# Patient Record
Sex: Female | Born: 1970 | Race: Black or African American | Hispanic: No | Marital: Married | Smoking: Never smoker
Health system: Southern US, Community
[De-identification: ages and names within clinical notes are randomized; demographics above are authoritative.]

## PROBLEM LIST (undated history)

## (undated) DIAGNOSIS — G43909 Migraine, unspecified, not intractable, without status migrainosus: Secondary | ICD-10-CM

## (undated) DIAGNOSIS — F0781 Postconcussional syndrome: Secondary | ICD-10-CM

## (undated) HISTORY — DX: Postconcussional syndrome: F07.81

## (undated) HISTORY — PX: TUBAL LIGATION: SHX77

## (undated) HISTORY — PX: TONSILLECTOMY: SUR1361

---

## 2001-05-09 ENCOUNTER — Inpatient Hospital Stay (HOSPITAL_COMMUNITY): Admission: AD | Admit: 2001-05-09 | Discharge: 2001-05-09 | Payer: Self-pay | Admitting: *Deleted

## 2001-05-20 ENCOUNTER — Inpatient Hospital Stay (HOSPITAL_COMMUNITY): Admission: AD | Admit: 2001-05-20 | Discharge: 2001-05-23 | Payer: Self-pay | Admitting: *Deleted

## 2001-12-23 ENCOUNTER — Ambulatory Visit (HOSPITAL_COMMUNITY): Admission: RE | Admit: 2001-12-23 | Discharge: 2001-12-23 | Payer: Self-pay | Admitting: Family Medicine

## 2003-03-09 ENCOUNTER — Other Ambulatory Visit: Admission: RE | Admit: 2003-03-09 | Discharge: 2003-03-09 | Payer: Self-pay | Admitting: *Deleted

## 2003-03-19 ENCOUNTER — Inpatient Hospital Stay (HOSPITAL_COMMUNITY): Admission: AD | Admit: 2003-03-19 | Discharge: 2003-03-19 | Payer: Self-pay | Admitting: *Deleted

## 2003-06-07 ENCOUNTER — Ambulatory Visit (HOSPITAL_COMMUNITY): Admission: RE | Admit: 2003-06-07 | Discharge: 2003-06-07 | Payer: Self-pay | Admitting: Obstetrics and Gynecology

## 2003-10-24 ENCOUNTER — Inpatient Hospital Stay (HOSPITAL_COMMUNITY): Admission: AD | Admit: 2003-10-24 | Discharge: 2003-10-26 | Payer: Self-pay | Admitting: Obstetrics and Gynecology

## 2003-12-02 ENCOUNTER — Other Ambulatory Visit: Admission: RE | Admit: 2003-12-02 | Discharge: 2003-12-02 | Payer: Self-pay | Admitting: Obstetrics and Gynecology

## 2005-02-28 ENCOUNTER — Other Ambulatory Visit: Admission: RE | Admit: 2005-02-28 | Discharge: 2005-02-28 | Payer: Self-pay | Admitting: Obstetrics and Gynecology

## 2006-05-31 ENCOUNTER — Other Ambulatory Visit: Admission: RE | Admit: 2006-05-31 | Discharge: 2006-05-31 | Payer: Self-pay | Admitting: Obstetrics and Gynecology

## 2007-07-28 ENCOUNTER — Ambulatory Visit (HOSPITAL_COMMUNITY): Admission: RE | Admit: 2007-07-28 | Discharge: 2007-07-28 | Payer: Self-pay | Admitting: Obstetrics and Gynecology

## 2010-03-02 ENCOUNTER — Encounter: Admission: RE | Admit: 2010-03-02 | Discharge: 2010-03-02 | Payer: Self-pay | Admitting: Internal Medicine

## 2010-12-05 NOTE — Op Note (Signed)
NAMEMICHELLE, [name]               ACCOUNT NO.:  [id number]   MEDICAL RECORD NO.:  [id number]          PATIENT TYPE:  AMB   LOCATION:  SDC                           [facility]ILITY:  [facility]   PHYSICIAN:  Dois Davenport A. Rivard, M.D. DATE OF BIRTH:  [birth year]   DATE OF PROCEDURE:  07/28/2007  DATE OF DISCHARGE:                               OPERATIVE REPORT   PREOPERATIVE DIAGNOSIS:  Desire for sterilization.   POSTOPERATIVE DIAGNOSIS:  Desire for sterilization.   ANESTHESIA:  General.   PROCEDURE:  Bilateral tubal ligation with laparoscopy.   SURGEON:  Crist Fat. Rivard, M.D.  no assistant.   ESTIMATED BLOOD LOSS:  Minimal.   PROCEDURE:  After being informed of the planned procedure with possible  complications including bleeding, infection, injury to bowels, bladder  or ureters, need for laparotomy, irreversibility and failure rate of one  in 1000, informed consent is obtained.  The patient is taken to OR #3,  given general anesthesia with endotracheal intubation without any  complication.  She is placed in a lithotomy position, prepped and draped  in a sterile fashion, and her bladder is emptied with an in-and-out  Foley catheter.  Pelvic exam reveals an anteverted uterus, normal in  size and shape, and two normal adnexa.  A speculum is inserted.  Anterior lip of the cervix is grasped with a tenaculum forceps and we  place an acorn intrauterine manipulator without difficulty.  The  speculum is then removed.   We then infiltrate the umbilical area with 7 mL of Marcaine 0.25% and  perform a semielliptical incision, which is brought down bluntly to the  fascia.  Fascia is identified and grasped with Kocher forceps and  incised with Mayo scissors.  Peritoneum is entered bluntly.  A  pursestring suture of 0 Vicryl is placed on the fascia and a 10-mm  Hasson trocar is easily inserted, held in place with a pursestring  suture.   We can now insufflate pneumoperitoneum with CO2 at a maximum  pressure of  15 mmHg and insert and operative laparoscope.   Observation:  Anterior cul-de-sac and posterior cul-de-sac are normal.  Uterus is normal.  Both tubes and both ovaries are normal.  The appendix  is visualized and normal.  Liver and gallbladder are normal.   Using bipolar cauterization, each tube is cauterized in its isthmic-  ampullary area on a distance of 1.5 cm including mesosalpinx.  Instruments are then removed, pneumoperitoneum is evacuated, and the  previously-placed pursestring suture is tied to close the fascia.  Skin  is closed with a subcuticular suture of 3-0 Monocryl and Dermabond.   Instrument and sponge counts are complete x2.  Estimated blood loss is  minimal.  The procedure is very well-tolerated by the patient, who is  taken to recovery room in a well and stable condition.      Crist Fat Rivard, M.D.  Electronically Signed     SAR/MEDQ  D:  07/28/2007  T:  07/28/2007  Job:  [id number]

## 2010-12-08 NOTE — H&P (Signed)
NAME:  Jennifer Manning, Jennifer Manning                         ACCOUNT NO.:  [id number]   MEDICAL RECORD NO.:  [id number]                   PATIENT TYPE:  INP   LOCATION:  9107                                 [facility]ILITY:  [facility]   PHYSICIAN:  Jennifer Manning, M.D.             DATE OF BIRTH:  11-Dec-1970   DATE OF ADMISSION:  10/24/2003  DATE OF DISCHARGE:                                HISTORY & PHYSICAL   Jennifer Manning is a 40 year old married African American female.  She is  gravida 4, para 2-0-1-2 at [redacted] weeks gestation.  EDD October 31, 2003, by LMP  dates and confirmed with 18-week ultrasound.  She presents in labor with  contractions every two to three minutes.  Her labor began last evening.  Her  contractions picked up through the night and increasing all day today.  She  reports positive fetal movement.  No bleeding, no rupture of membranes.  Denies any PIH symptoms, no headaches, visual changes or epigastric change.  Her pregnancy has been followed by the M.D. service at [facility] and is  remarkable for:  1. Advanced maternal age.  2. Transfer from Dr. Elliot Gault at 18 weeks.  3. Group B Strep negative.  4. Desires BTL.   This patient began prenatal care at [facility] on June 03, 2003, at 18 weeks  and 5 days by dates.  Her pregnancy has been complicated by preterm  contractures with no cervical change.  She has had frequent fetal  fibronectins approximately every two weeks since 23 weeks, which have all  been negative.  She has been size equal to dates throughout, normotensive  with no proteinuria.   OBSTETRICAL HISTORY:  In 1997, the patient had a normal spontaneous vaginal  delivery at 40 weeks with the birth of a 7 pound 1 ounce female infant named  Jennifer Manning, with no complications.  In the year 2000, the patient had a first  trimester SAB with D&C.  In 2002, the patient had a normal spontaneous  vaginal delivery at 40 weeks, birth of a 7 pound female infant named Jennifer Manning,  at term with no  complications.   ALLERGIES:  Patient is allergic to SULFA which causes swelling in her  throat.   PAST MEDICAL HISTORY:  1. History of anemia with pregnancy.  2. Abnormal Pap smear in 1989 with colposcopy, within normal limits since     that time.   PAST SURGICAL HISTORY:  Tonsils 1987.   FAMILY HISTORY:  Maternal grandmother with MI.  Father, mother and maternal  grandmother with hypertension.  The patient states diabetes runs in both  sides of the family.  Her mother has a history of epilepsy.  Paternal  grandmother with stomach cancer.  Maternal grandmother with throat cancer.   GENETIC HISTORY:  Father of the baby and his sister have scoliosis and  father of the baby's first cousin has sickle cell disease.   SOCIAL [name] is a 40 year old married African American  female.  Her husband, Jennifer Manning, is involved and supportive.  They  follow the [employer] faith.  She denies the use of tobacco,  alcohol or illicit drugs.   Her blood type is B positive.  Quad screen was normal.  Glucola at 28 weeks  within normal limits.  Her hemoglobin at that time was 8.3.  She is  currently taking iron.  At 36 weeks, culture of the vaginal tract is  negative for group B Strep, GC and chlamydia.  Her other initial prenatal  lab work will be looked up in the computer.   REVIEW OF SYMPTOMS:  As described above.  The patient is at term in early  active labor with positive fetal movement, no bleeding and no rupture of  membranes.   PHYSICAL EXAMINATION:  VITAL SIGNS:  Stable.  Her initial blood pressure was  137/91.  She is afebrile.  Blood pressure will be repeated.  HEENT:  Unremarkable.  CARDIOVASCULAR:  Regular rate and rhythm.  ABDOMEN:  Gravid in its contour.  Uterine fundus is noted to extend 39 cm  above the level of the pubic symphysis.  Leopold's maneuver finds the infant  to be in longitudinal lie, cephalic presentation and the estimated fetal  weight is  7-1/2 pounds.  The baseline of the fetal heart rate monitor is 130  to 140 with average long term variability.  Reactivity is present with no  deceleration noted.  The patient is contracting every two to three minutes.  CERVIX:  Digital examination of the cervix finds it to be 5 cm dilated, 90%  effaced with the cephalic presenting part at a -1 station and membranes  intact.  EXTREMITIES:  No pathologic edema.  DTRs are 1+ with no clonus.   ASSESSMENT:  1. Intrauterine pregnancy at 39 weeks.  2. Active labor.  3. Desires sterilization.   PLAN:  Admit per Jennifer Manning, M.D.  May have epidural.     Jennifer Manning, C.N.M.               Jennifer Manning, M.D.    SDM/MEDQ  D:  10/24/2003  T:  10/25/2003  Job:  [id number]

## 2011-04-11 LAB — CBC
HCT: 33.3 — ABNORMAL LOW
Hemoglobin: 11 — ABNORMAL LOW
MCHC: 33.1
MCV: 85.7
Platelets: 358
RBC: 3.89
RDW: 14.9
WBC: 4.5

## 2011-04-11 LAB — PREGNANCY, URINE: Preg Test, Ur: NEGATIVE

## 2011-10-12 ENCOUNTER — Ambulatory Visit (INDEPENDENT_AMBULATORY_CARE_PROVIDER_SITE_OTHER): Payer: BC Managed Care – PPO | Admitting: Obstetrics and Gynecology

## 2011-10-12 DIAGNOSIS — Z01419 Encounter for gynecological examination (general) (routine) without abnormal findings: Secondary | ICD-10-CM

## 2011-10-12 DIAGNOSIS — Z124 Encounter for screening for malignant neoplasm of cervix: Secondary | ICD-10-CM

## 2012-04-15 ENCOUNTER — Other Ambulatory Visit: Payer: Self-pay | Admitting: Internal Medicine

## 2012-04-15 DIAGNOSIS — Z1231 Encounter for screening mammogram for malignant neoplasm of breast: Secondary | ICD-10-CM

## 2012-05-02 ENCOUNTER — Ambulatory Visit
Admission: RE | Admit: 2012-05-02 | Discharge: 2012-05-02 | Disposition: A | Discharge status: Home / Self Care | Payer: Self-pay | Source: Ambulatory Visit | Attending: Internal Medicine | Admitting: Internal Medicine

## 2012-05-02 DIAGNOSIS — Z1231 Encounter for screening mammogram for malignant neoplasm of breast: Secondary | ICD-10-CM

## 2013-10-28 ENCOUNTER — Other Ambulatory Visit: Payer: Self-pay

## 2013-10-28 DIAGNOSIS — Z1231 Encounter for screening mammogram for malignant neoplasm of breast: Secondary | ICD-10-CM

## 2013-11-06 ENCOUNTER — Ambulatory Visit
Admission: RE | Admit: 2013-11-06 | Discharge: 2013-11-06 | Disposition: A | Discharge status: Home / Self Care | Payer: BC Managed Care – PPO | Source: Ambulatory Visit

## 2013-11-06 DIAGNOSIS — Z1231 Encounter for screening mammogram for malignant neoplasm of breast: Secondary | ICD-10-CM

## 2013-11-11 ENCOUNTER — Ambulatory Visit: Payer: Self-pay

## 2013-11-11 ENCOUNTER — Other Ambulatory Visit: Payer: Self-pay | Admitting: Internal Medicine

## 2013-11-11 DIAGNOSIS — R928 Other abnormal and inconclusive findings on diagnostic imaging of breast: Secondary | ICD-10-CM

## 2013-11-20 ENCOUNTER — Encounter (INDEPENDENT_AMBULATORY_CARE_PROVIDER_SITE_OTHER): Payer: Self-pay

## 2013-11-20 ENCOUNTER — Ambulatory Visit
Admission: RE | Admit: 2013-11-20 | Discharge: 2013-11-20 | Disposition: A | Discharge status: Home / Self Care | Payer: BC Managed Care – PPO | Source: Ambulatory Visit | Attending: Internal Medicine | Admitting: Internal Medicine

## 2013-11-20 DIAGNOSIS — R928 Other abnormal and inconclusive findings on diagnostic imaging of breast: Secondary | ICD-10-CM

## 2013-12-20 ENCOUNTER — Ambulatory Visit (INDEPENDENT_AMBULATORY_CARE_PROVIDER_SITE_OTHER): Payer: BC Managed Care – PPO | Admitting: Physician Assistant

## 2013-12-20 VITALS — BP 102/66 | HR 74 | Temp 98.5°F | Ht 60.0 in | Wt 128.4 lb

## 2013-12-20 DIAGNOSIS — J029 Acute pharyngitis, unspecified: Secondary | ICD-10-CM

## 2013-12-20 DIAGNOSIS — E559 Vitamin D deficiency, unspecified: Secondary | ICD-10-CM | POA: Insufficient documentation

## 2013-12-20 LAB — POCT RAPID STREP A (OFFICE): Rapid Strep A Screen: NEGATIVE

## 2013-12-20 MED ORDER — MAGIC MOUTHWASH W/LIDOCAINE: 10.0000 mL | ORAL | Status: DC | PRN

## 2013-12-20 MED ORDER — IPRATROPIUM BROMIDE 0.03 % NA SOLN: 2.0000 | Freq: Two times a day (BID) | NASAL | Status: DC

## 2013-12-20 NOTE — Progress Notes (Signed)
Subjective:    Patient ID: Jennifer Manning, female    DOB: 1970-11-25, 43 y.o.   MRN: [id number]   PCP: Maximino Greenland, MD  Chief Complaint  Patient presents with  . sore throat    x 3 days      Active Ambulatory Problems    Diagnosis Date Noted  . Vitamin D deficiency 12/20/2013   Resolved Ambulatory Problems    Diagnosis Date Noted  . No Resolved Ambulatory Problems   No Additional Past Medical History    Past Surgical History  Procedure Laterality Date  . Tubal ligation    . Tonsillectomy      Allergies  Allergen Reactions  . Sulfa Antibiotics Anaphylaxis    Prior to Admission medications   Medication Sig Start Date End Date Taking? Authorizing Provider  ergocalciferol (VITAMIN D2) 50000 UNITS capsule Take 50,000 Units by mouth 2 (two) times a week.   Yes Historical Provider, MD  magnesium 30 MG tablet Take 30 mg by mouth 2 (two) times daily.   Yes Historical Provider, MD    History   Social History  . Marital Status: Married    Spouse Name: [name]    Number of Children: 3  . Years of Education: N/A   Occupational History  . speech pathologist [place] History Main Topics  . Smoking status: Never Smoker   . Smokeless tobacco: Never Used  . Alcohol Use: No  . Drug Use: None  . Sexual Activity: None   Other Topics Concern  . None   Social History Narrative   Lives with her husband and 3 children.  Her husband has been working in [facility], where they will move soon.    family history is not on file. indicated that her mother is deceased. She indicated that her father is deceased. She indicated that her brother is alive.   HPI  Sore throat x 3 days.  Initially felt like it was dry, so she increased her oral fluid intake.  Awoke about 2 am today with significantly worse pain. Pain in the LEFT neck and ear.  Left side of her throat looks swollen.  Chills, no fever.some coughing, but mild. No nasal congestion nor drainage.  Now  has a mild HA.  No known sick contacts, though she works in the [employer]. A colleague had strep throat seeral weeks ago.  Review of Systems As above.  No changes in bowel/bladder habits. No unexplained myalgias, arthralgias. No rash.    Objective:   Physical Exam  Constitutional: She is oriented to person, place, and time. She appears well-developed and well-nourished. She is active and cooperative. No distress.  BP 102/66  Pulse 74  Temp(Src) 98.5 F (36.9 C) (Oral)  Ht 5' (1.524 m)  Wt 128 lb 6.4 oz (58.242 kg)  BMI 25.08 kg/m2  SpO2 98%   HENT:  Head: Normocephalic and atraumatic.  Right Ear: Hearing, tympanic membrane, external ear and ear canal normal.  Left Ear: Hearing, tympanic membrane, external ear and ear canal normal.  Nose: Nose normal.  Mouth/Throat: Oropharynx is clear and moist and mucous membranes are normal. No oropharyngeal exudate.  Tonsils are surgically absent  Eyes: Conjunctivae and EOM are normal. Pupils are equal, round, and reactive to light. Right eye exhibits no discharge. Left eye exhibits no discharge. No scleral icterus.  Neck: Normal range of motion. Neck supple. No thyromegaly present.  Cardiovascular: Normal rate, regular rhythm and normal heart sounds.  Pulmonary/Chest: Effort normal and breath sounds normal.  Lymphadenopathy:    She has no cervical adenopathy (but tonsillar node area is tender, without palpable LAD).  Neurological: She is alert and oriented to person, place, and time.  Skin: Skin is warm and dry.  Psychiatric: She has a normal mood and affect. Her behavior is normal.   Results for orders placed in visit on 12/20/13  POCT RAPID STREP A (OFFICE)      Result Value Ref Range   Rapid Strep A Screen Negative  Negative          Assessment & Plan:  1. Acute pharyngitis Likely viral.  Await culture. Supportive care. Anticipatory guidance. - POCT rapid strep A - Culture, Group A Strep - Alum & Mag  Hydroxide-Simeth (MAGIC MOUTHWASH W/LIDOCAINE) SOLN; Take 10 mLs by mouth every 2 (two) hours as needed for mouth pain.  Dispense: 360 mL; Refill: 0 - ipratropium (ATROVENT) 0.03 % nasal spray; Place 2 sprays into both nostrils 2 (two) times daily.  Dispense: 30 mL; Refill: 0  Return if symptoms worsen or fail to improve.   Fara Chute, PA-C Physician Assistant-Certified [facility]

## 2013-12-20 NOTE — Patient Instructions (Signed)
Get plenty of rest and drink at least 64 ounces of water daily. Ibuprofen and/or acetaminophen as needed for pain (throat, head).

## 2013-12-22 LAB — CULTURE, GROUP A STREP: Organism ID, Bacteria: NORMAL

## 2014-09-06 ENCOUNTER — Ambulatory Visit (INDEPENDENT_AMBULATORY_CARE_PROVIDER_SITE_OTHER): Payer: BC Managed Care – PPO | Admitting: Emergency Medicine

## 2014-09-06 VITALS — BP 112/78 | HR 77 | Temp 98.2°F | Resp 16 | Ht 60.0 in | Wt 126.2 lb

## 2014-09-06 DIAGNOSIS — G5622 Lesion of ulnar nerve, left upper limb: Secondary | ICD-10-CM

## 2014-09-06 MED ORDER — NAPROXEN SODIUM 550 MG PO TABS: 550.0000 mg | ORAL_TABLET | Freq: Two times a day (BID) | ORAL | Status: AC

## 2014-09-06 NOTE — Progress Notes (Signed)
[facility] [address], [place] [address] [phone]  Date:  09/06/2014   Name:  Jennifer Manning   DOB:  [birth year]   MRN:  [id number]  PCP:  Maximino Greenland, MD    Chief Complaint: Elbow Pain   History of Present Illness:  Jennifer Manning is a 44 y.o. very pleasant female patient who presents with the following:  2 week history of ulnar radiation numbness and elbow pain No history of injury or overuse. No weakness. No improvement with over the counter medications or other home remedies.  Denies other complaint or health concern today.   Patient Active Problem List   Diagnosis Date Noted  . Vitamin D deficiency 12/20/2013    No past medical history on file.  Past Surgical History  Procedure Laterality Date  . Tubal ligation    . Tonsillectomy      History  Substance Use Topics  . Smoking status: Never Smoker   . Smokeless tobacco: Never Used  . Alcohol Use: No    No family history on file.  Allergies  Allergen Reactions  . Sulfa Antibiotics Anaphylaxis    Medication list has been reviewed and updated.  Current Outpatient Prescriptions on File Prior to Visit  Medication Sig Dispense Refill  . ergocalciferol (VITAMIN D2) 50000 UNITS capsule Take 50,000 Units by mouth 2 (two) times a week.    . magnesium 30 MG tablet Take 30 mg by mouth 2 (two) times daily.     No current facility-administered medications on file prior to visit.    Review of Systems:  As per HPI, otherwise negative.    Physical Examination: Filed Vitals:   09/06/14 1458  BP: 112/78  Pulse: 77  Temp: 98.2 F (36.8 C)  Resp: 16   Filed Vitals:   09/06/14 1458  Height: 5' (1.524 m)  Weight: 126 lb 3.2 oz (57.244 kg)   Body mass index is 24.65 kg/(m^2). Ideal Body Weight: Weight in (lb) to have BMI = 25: 127.7   GEN: WDWN, NAD, Non-toxic, Alert & Oriented x 3 HEENT: Atraumatic, Normocephalic.  Ears and Nose: No external deformity. EXTR: No  clubbing/cyanosis/edema NEURO: Normal gait.  PSYCH: Normally interactive. Conversant. Not depressed or anxious appearing.  Calm demeanor.  LEFT elbow tender over lateral epicondyle no ecchymosis or swelling.  Normal ROM.  Neuro intact  Assessment and Plan: Ulnar neuritis Anaprox Local heat Follow up in one week  Signed,  Ellison Carwin, MD

## 2014-09-06 NOTE — Patient Instructions (Signed)
Tennis Tennis Elbow Your caregiver has diagnosed you with a condition often referred to as "tennis elbow." This results from small tears or soreness (inflammation) at the start (origin) of the extensor muscles of the forearm. Although the condition is often called tennis or golfer's elbow, it is caused by any repetitive action performed by your elbow. HOME CARE INSTRUCTIONS  If the condition has been short lived, rest may be the only treatment required. Using your opposite hand or arm to perform the task may help. Even changing your grip may help rest the extremity. These may even prevent the condition from recurring.  Longer standing problems, however, will often be relieved faster by:  Using anti-inflammatory agents.  Applying ice packs for 30 minutes at the end of the working day, at bed time, or when activities are finished.  Your caregiver may also have you wear a splint or sling. This will allow the inflamed tendon to heal. At times, steroid injections aided with a local anesthetic will be required along with splinting for 1 to 2 weeks. Two to three steroid injections will often solve the problem. In some long standing cases, the inflamed tendon does not respond to conservative (non-surgical) therapy. Then surgery may be required to repair it. MAKE SURE YOU:   Understand these instructions.  Will watch your condition.  Will get help right away if you are not doing well or get worse. Document Released: 07/09/2005 Document Revised: 10/01/2011 Document Reviewed: 02/25/2008 [facility] Patient Information 2015 [place], [place]. This information is not intended to replace advice given to you by your health care provider. Make sure you discuss any questions you have with your health care provider.

## 2014-12-06 ENCOUNTER — Encounter (HOSPITAL_COMMUNITY): Payer: Self-pay | Admitting: Emergency Medicine

## 2014-12-06 ENCOUNTER — Emergency Department (HOSPITAL_COMMUNITY)
Admission: EM | Admit: 2014-12-06 | Discharge: 2014-12-06 | Disposition: A | Discharge status: Home / Self Care | Payer: BC Managed Care – PPO | Attending: Emergency Medicine | Admitting: Emergency Medicine

## 2014-12-06 ENCOUNTER — Ambulatory Visit (INDEPENDENT_AMBULATORY_CARE_PROVIDER_SITE_OTHER): Payer: BC Managed Care – PPO | Admitting: Emergency Medicine

## 2014-12-06 VITALS — BP 108/70 | HR 84 | Temp 98.1°F | Resp 16 | Ht 61.0 in | Wt 124.0 lb

## 2014-12-06 DIAGNOSIS — R110 Nausea: Secondary | ICD-10-CM

## 2014-12-06 DIAGNOSIS — J029 Acute pharyngitis, unspecified: Secondary | ICD-10-CM

## 2014-12-06 DIAGNOSIS — G43909 Migraine, unspecified, not intractable, without status migrainosus: Secondary | ICD-10-CM | POA: Diagnosis not present

## 2014-12-06 DIAGNOSIS — R51 Headache: Secondary | ICD-10-CM

## 2014-12-06 DIAGNOSIS — Z79899 Other long term (current) drug therapy: Secondary | ICD-10-CM | POA: Insufficient documentation

## 2014-12-06 DIAGNOSIS — G43109 Migraine with aura, not intractable, without status migrainosus: Secondary | ICD-10-CM | POA: Diagnosis not present

## 2014-12-06 DIAGNOSIS — R519 Headache, unspecified: Secondary | ICD-10-CM

## 2014-12-06 HISTORY — DX: Migraine, unspecified, not intractable, without status migrainosus: G43.909

## 2014-12-06 LAB — POCT RAPID STREP A (OFFICE): Rapid Strep A Screen: NEGATIVE

## 2014-12-06 MED ORDER — METOCLOPRAMIDE HCL 5 MG/ML IJ SOLN
10.0000 mg | Freq: Once | INTRAMUSCULAR | Status: AC
  Administered 2014-12-06: 10 mg via INTRAVENOUS
  Filled 2014-12-06: qty 2

## 2014-12-06 MED ORDER — DIPHENHYDRAMINE HCL 50 MG/ML IJ SOLN
25.0000 mg | Freq: Once | INTRAMUSCULAR | Status: DC
Start: 2014-12-06 — End: 2014-12-07

## 2014-12-06 MED ORDER — ONDANSETRON 4 MG PO TBDP
8.0000 mg | ORAL_TABLET | Freq: Once | ORAL | Status: AC
  Administered 2014-12-06: 8 mg via ORAL

## 2014-12-06 MED ORDER — SODIUM CHLORIDE 0.9 % IV BOLUS (SEPSIS)
1000.0000 mL | Freq: Once | INTRAVENOUS | Status: AC
  Administered 2014-12-06: 1000 mL via INTRAVENOUS

## 2014-12-06 MED ORDER — KETOROLAC TROMETHAMINE 60 MG/2ML IM SOLN
60.0000 mg | Freq: Once | INTRAMUSCULAR | Status: AC
  Administered 2014-12-06: 60 mg via INTRAMUSCULAR

## 2014-12-06 MED ORDER — DIPHENHYDRAMINE HCL 50 MG/ML IJ SOLN
25.0000 mg | Freq: Once | INTRAMUSCULAR | Status: AC
  Administered 2014-12-06: 25 mg via INTRAVENOUS
  Filled 2014-12-06: qty 1

## 2014-12-06 MED ORDER — KETOROLAC TROMETHAMINE 30 MG/ML IJ SOLN
30.0000 mg | Freq: Once | INTRAMUSCULAR | Status: AC
  Administered 2014-12-06: 30 mg via INTRAVENOUS
  Filled 2014-12-06: qty 1

## 2014-12-06 NOTE — Progress Notes (Signed)
   Subjective:  This chart was scribed for Nena Jordan, MD by [facility], medical scribe at [facility].The patient was seen in exam room 13 and the patient's care was started at 12:16 PM.   Patient ID: Jennifer Manning, female    DOB: [birth year], 44 y.o.   MRN: [id number] Chief Complaint  Patient presents with  . Migraine  . Cough    Somewhat Productive  . Sore Throat    Migraine  Associated symptoms include coughing, nausea, photophobia and a sore throat. Pertinent negatives include no vomiting.  Cough Associated symptoms include headaches and a sore throat.  Sore Throat  Associated symptoms include coughing and headaches. Pertinent negatives include no vomiting.    HPI Comments: Jennifer Manning is a 44 y.o. female who [facility] to [facility] complaining of a recurrent migraine flare up. Her migraine began on Thursday and worsened yesterday. She says the migraine is similar to past migraines but more painful. She rates the pain as 7/10. The pain radiates down her neck. Pt is taking excedrin for little relief. She has nausea, photophobia and chills as associated symptoms. Light and sound worsen her headache. She also complains of a cough and sore throat. The cough worsens her migraine. Her PCP is Dr. Baird Cancer. She denies vomiting.   Review of Systems  HENT: Positive for sore throat.   Eyes: Positive for photophobia.  Respiratory: Positive for cough.   Gastrointestinal: Positive for nausea. Negative for vomiting.  Neurological: Positive for headaches.      Objective:  BP 108/70 mmHg  Pulse 84  Temp(Src) 98.1 F (36.7 C) (Oral)  Resp 16  Ht 5\' 1"  (1.549 m)  Wt 124 lb (56.246 kg)  BMI 23.44 kg/m2  SpO2 98% Physical Exam  Nursing note and vitals reviewed. CONSTITUTIONAL: Well developed/well nourished HEAD: Normocephalic/atraumatic EYES: EOMI/PERRL ENMT: Mucous membranes moist NECK: supple no meningeal signs SPINE/BACK:entire spine  nontender CV: S1/S2 noted, no murmurs/rubs/gallops noted LUNGS: Lungs are clear to auscultation bilaterally, no apparent distress ABDOMEN: soft, nontender, no rebound or guarding, bowel sounds noted throughout abdomen GU:no cva tenderness NEURO: Pt is awake/alert/appropriate, moves all extremitiesx4.  No facial droop.   EXTREMITIES: pulses normal/equal, full ROM SKIN: warm, color normal PSYCH: no abnormalities of mood noted, alert and oriented to situation.    Results for orders placed or performed in visit on 12/06/14  POCT rapid strep A  Result Value Ref Range   Rapid Strep A Screen Negative Negative   Assessment & Plan:  Was in long emergency room was called and she was transported by private car for their evaluation. She received partial relief with Toradol injection but still had a 4-5 out of 10 headache. I also suggested she consider prophylactic treatment with Topamax or Depakote in the future.I personally performed the services described in this documentation, which was scribed in my presence. The recorded information has been reviewed and is accurate.  Nena Jordan, MD

## 2014-12-06 NOTE — Discharge Instructions (Signed)
Refer to attached documents for more information.

## 2014-12-06 NOTE — ED Notes (Signed)
Patient is alert and oriented x3.  She was given DC instructions and follow up visit instructions.  Patient gave verbal understanding. She was DC ambulatory under her own power to home.  V/S stable.  He was not showing any signs of distress on DC 

## 2014-12-06 NOTE — Progress Notes (Signed)
° °  Subjective:  This chart was scribed for Nena Jordan, MD by [facility], medical scribe at [facility].The patient was seen in exam room 13 and the patient's care was started at 12:16 PM.   Patient ID: Jennifer Manning, female    DOB: Jun 12, 1971, 44 y.o.   MRN: [id number] Chief Complaint  Patient presents with   Migraine   Cough    Somewhat Productive   Sore Throat    HPI  HPI Comments: Jennifer Manning is a 44 y.o. female who presents to [facility] complaining of a recurrent migraine flare up. Her migraine began on Thursday and worsened yesterday. She says the migraine is similar to past migraines but more painful. She rates the pain as 7/10. The pain radiates down her neck. Pt is taking excedrin for little relief. She has nausea, photophobia and chills as associated symptoms. Light and sound worsen her headache. She also complains of a cough and sore throat. The cough worsens her migraine. Her PCP is Dr. Baird Cancer. She denies vomiting.   Review of Systems  HENT: Positive for sore throat.   Eyes: Positive for photophobia.  Respiratory: Positive for cough.   Gastrointestinal: Positive for nausea. Negative for vomiting.  Neurological: Positive for headaches.      Objective:  BP 108/70 mmHg   Pulse 84   Temp(Src) 98.1 F (36.7 C) (Oral)   Resp 16   Ht 5\' 1"  (1.549 m)   Wt 124 lb (56.246 kg)   BMI 23.44 kg/m2   SpO2 98% Physical Exam  Nursing note and vitals reviewed. CONSTITUTIONAL: Well developed/well nourished HEAD: Normocephalic/atraumatic EYES: EOMI/PERRL ENMT: Mucous membranes moist NECK: supple no meningeal signs SPINE/BACK:entire spine nontender CV: S1/S2 noted, no murmurs/rubs/gallops noted LUNGS: Lungs are clear to auscultation bilaterally, no apparent distress ABDOMEN: soft, nontender, no rebound or guarding, bowel sounds noted throughout abdomen GU:no cva tenderness NEURO: Pt is awake/alert/appropriate, moves all extremitiesx4.   No facial droop.   EXTREMITIES: pulses normal/equal, full ROM SKIN: warm, color normal PSYCH: no abnormalities of mood noted, alert and oriented to situation.    Results for orders placed or performed in visit on 12/06/14  POCT rapid strep A  Result Value Ref Range   Rapid Strep A Screen Negative Negative   Assessment & Plan:  Was in long emergency room was called and she was transported by private car for their evaluation. She received partial relief with Toradol injection but still had a 4-5 out of 10 headache. I also suggested she consider prophylactic treatment with Topamax or Depakote in the future.I personally performed the services described in this documentation, which was scribed in my presence. The recorded information has been reviewed and is accurate.  Nena Jordan, MD

## 2014-12-06 NOTE — ED Notes (Signed)
Pt states she has a hx of migraines and has had one since Thrusday. Alert and oriented.

## 2014-12-06 NOTE — ED Provider Notes (Signed)
CSN: [id number]     Arrival date & time 12/06/14  1714 History   First MD Initiated Contact with Patient 12/06/14 2021     Chief Complaint  Patient presents with  . Migraine     (Consider location/radiation/quality/duration/timing/severity/associated sxs/prior Treatment) HPI Comments: Patient is a 44 year old female with a past medical history of migraines who presents with a headache for 3 days. Patient reports a gradual onset and progressive worsening of the headache. The pain is sharp, constant and is located in generalized head without radiation. Patient has tried excedrin migraine for symptoms without relief. No alleviating/aggravating factors. Patient reports associated nausea and photophobia. Patient denies fever, vomiting, diarrhea, numbness/tingling, weakness, visual changes, congestion, chest pain, SOB, abdominal pain.      Past Medical History  Diagnosis Date  . Migraines    Past Surgical History  Procedure Laterality Date  . Tubal ligation    . Tonsillectomy     History reviewed. No pertinent family history. History  Substance Use Topics  . Smoking status: Never Smoker   . Smokeless tobacco: Never Used  . Alcohol Use: No   OB History    No data available     Review of Systems  Constitutional: Negative for fever, chills and fatigue.  HENT: Negative for trouble swallowing.   Eyes: Negative for visual disturbance.  Respiratory: Negative for shortness of breath.   Cardiovascular: Negative for chest pain and palpitations.  Gastrointestinal: Negative for nausea, vomiting, abdominal pain and diarrhea.  Genitourinary: Negative for dysuria.  Musculoskeletal: Negative for arthralgias and neck pain.  Skin: Negative for color change.  Neurological: Positive for headaches. Negative for dizziness and weakness.  Psychiatric/Behavioral: Negative for dysphoric mood.      Allergies  Sulfa antibiotics  Home Medications   Prior to Admission medications   Medication  Sig Start Date End Date Taking? Authorizing Provider  ergocalciferol (VITAMIN D2) 50000 UNITS capsule Take 50,000 Units by mouth 2 (two) times a week.    Historical Provider, MD  magnesium 30 MG tablet Take 30 mg by mouth 2 (two) times daily.    Historical Provider, MD  naproxen sodium (ANAPROX DS) 550 MG tablet Take 1 tablet (550 mg total) by mouth 2 (two) times daily with a meal. Patient not taking: Reported on 12/06/2014 09/06/14 09/06/15  Roselee Culver, MD   BP 105/74 mmHg  Pulse 65  Temp(Src) 98.5 F (36.9 C) (Oral)  Resp 18  SpO2 100%  LMP 11/28/2014 (Approximate) Physical Exam  Constitutional: She is oriented to person, place, and time. She appears well-developed and well-nourished. No distress.  HENT:  Head: Normocephalic and atraumatic.  Mouth/Throat: Oropharynx is clear and moist. No oropharyngeal exudate.  Eyes: Conjunctivae and EOM are normal. Pupils are equal, round, and reactive to light.  Neck: Normal range of motion.  Cardiovascular: Normal rate and regular rhythm.  Exam reveals no gallop and no friction rub.   No murmur heard. Pulmonary/Chest: Effort normal and breath sounds normal. She has no wheezes. She has no rales. She exhibits no tenderness.  Abdominal: Soft. She exhibits no distension. There is no tenderness. There is no rebound.  Musculoskeletal: Normal range of motion.  Neurological: She is alert and oriented to person, place, and time. No cranial nerve deficit. Coordination normal.  Speech is goal-oriented. Moves limbs without ataxia.   Skin: Skin is warm and dry.  Psychiatric: She has a normal mood and affect. Her behavior is normal.  Nursing note and vitals reviewed.   ED Course  Procedures (including critical care time) Labs Review Labs Reviewed - No data to display  Imaging Review No results found.   EKG Interpretation None      MDM   Final diagnoses:  Migraine without status migrainosus, not intractable, unspecified migraine type     8:32 PM Patient will have migraine cocktail of fluids, toradol, reglan, and benadryl. Vitals stable and patient afebrile. No neuro deficits.  Patient feeling better and will be discharged without further evaluation.    Alvina Chou, PA-C 12/07/14 0127  Noemi Chapel, MD 12/07/14 1028

## 2014-12-07 ENCOUNTER — Telehealth: Payer: Self-pay | Admitting: *Deleted

## 2014-12-07 ENCOUNTER — Other Ambulatory Visit: Payer: Self-pay | Admitting: Radiology

## 2014-12-07 MED ORDER — AZITHROMYCIN 500 MG PO TABS: 500.0000 mg | ORAL_TABLET | Freq: Every day | ORAL | Status: DC

## 2014-12-07 NOTE — Telephone Encounter (Signed)
Pt is agreeable to z-pak. Will send this in.

## 2014-12-07 NOTE — Telephone Encounter (Signed)
Call patient and advised her we can try her on a Z-Pak that would cover her for early bronchitis. If she is agreeable call this in.

## 2014-12-07 NOTE — Telephone Encounter (Signed)
Pt called in concerning strep test from yesterday. I informed her of the result, but she reports feeling worse today. States that she has been running a low grade fever, and still continues to have a productive cough . Also stated that her ears were beginning to bother her. Wanted to know what if anything else could be done. Please advise.

## 2014-12-10 ENCOUNTER — Ambulatory Visit
Admission: RE | Admit: 2014-12-10 | Discharge: 2014-12-10 | Disposition: A | Discharge status: Home / Self Care | Payer: BC Managed Care – PPO | Source: Ambulatory Visit | Attending: *Deleted | Admitting: *Deleted

## 2014-12-10 ENCOUNTER — Other Ambulatory Visit: Payer: Self-pay | Admitting: Nurse Practitioner

## 2014-12-10 DIAGNOSIS — R05 Cough: Secondary | ICD-10-CM

## 2014-12-10 DIAGNOSIS — R059 Cough, unspecified: Secondary | ICD-10-CM

## 2015-02-15 ENCOUNTER — Ambulatory Visit
Admission: RE | Admit: 2015-02-15 | Discharge: 2015-02-15 | Disposition: A | Discharge status: Home / Self Care | Payer: BC Managed Care – PPO | Source: Ambulatory Visit | Attending: Internal Medicine | Admitting: Internal Medicine

## 2015-02-15 ENCOUNTER — Other Ambulatory Visit: Payer: Self-pay | Admitting: Internal Medicine

## 2015-02-15 DIAGNOSIS — J189 Pneumonia, unspecified organism: Secondary | ICD-10-CM

## 2016-04-24 ENCOUNTER — Emergency Department (HOSPITAL_COMMUNITY): Payer: BC Managed Care – PPO

## 2016-04-24 ENCOUNTER — Encounter (HOSPITAL_COMMUNITY): Payer: Self-pay | Admitting: Neurology

## 2016-04-24 ENCOUNTER — Emergency Department (HOSPITAL_COMMUNITY)
Admission: EM | Admit: 2016-04-24 | Discharge: 2016-04-25 | Disposition: A | Discharge status: Home / Self Care | Payer: BC Managed Care – PPO | Attending: Emergency Medicine | Admitting: Emergency Medicine

## 2016-04-24 DIAGNOSIS — Y92410 Unspecified street and highway as the place of occurrence of the external cause: Secondary | ICD-10-CM | POA: Insufficient documentation

## 2016-04-24 DIAGNOSIS — S8992XA Unspecified injury of left lower leg, initial encounter: Secondary | ICD-10-CM | POA: Insufficient documentation

## 2016-04-24 DIAGNOSIS — Y999 Unspecified external cause status: Secondary | ICD-10-CM | POA: Insufficient documentation

## 2016-04-24 DIAGNOSIS — R109 Unspecified abdominal pain: Secondary | ICD-10-CM | POA: Insufficient documentation

## 2016-04-24 DIAGNOSIS — S79911A Unspecified injury of right hip, initial encounter: Secondary | ICD-10-CM | POA: Diagnosis not present

## 2016-04-24 DIAGNOSIS — R4020 Unspecified coma: Secondary | ICD-10-CM

## 2016-04-24 DIAGNOSIS — R930 Abnormal findings on diagnostic imaging of skull and head, not elsewhere classified: Secondary | ICD-10-CM | POA: Diagnosis not present

## 2016-04-24 DIAGNOSIS — Y939 Activity, unspecified: Secondary | ICD-10-CM | POA: Insufficient documentation

## 2016-04-24 DIAGNOSIS — Z7982 Long term (current) use of aspirin: Secondary | ICD-10-CM | POA: Diagnosis not present

## 2016-04-24 DIAGNOSIS — S199XXA Unspecified injury of neck, initial encounter: Secondary | ICD-10-CM | POA: Diagnosis not present

## 2016-04-24 LAB — POC URINE PREG, ED: PREG TEST UR: NEGATIVE

## 2016-04-24 LAB — I-STAT CHEM 8, ED
BUN: 12 mg/dL (ref 6–20)
CALCIUM ION: 1.15 mmol/L (ref 1.15–1.40)
CHLORIDE: 106 mmol/L (ref 101–111)
CREATININE: 0.6 mg/dL (ref 0.44–1.00)
GLUCOSE: 94 mg/dL (ref 65–99)
HCT: 37 % (ref 36.0–46.0)
Hemoglobin: 12.6 g/dL (ref 12.0–15.0)
Potassium: 3.6 mmol/L (ref 3.5–5.1)
Sodium: 141 mmol/L (ref 135–145)
TCO2: 22 mmol/L (ref 0–100)

## 2016-04-24 MED ORDER — IOPAMIDOL (ISOVUE-300) INJECTION 61%
INTRAVENOUS | Status: AC
  Administered 2016-04-24: 100 mL
  Filled 2016-04-24: qty 100

## 2016-04-24 MED ORDER — ONDANSETRON HCL 4 MG/2ML IJ SOLN
4.0000 mg | Freq: Once | INTRAMUSCULAR | Status: AC
  Administered 2016-04-24: 4 mg via INTRAVENOUS
  Filled 2016-04-24: qty 2

## 2016-04-24 MED ORDER — MORPHINE SULFATE (PF) 2 MG/ML IV SOLN
2.0000 mg | Freq: Once | INTRAVENOUS | Status: AC
  Administered 2016-04-24: 2 mg via INTRAVENOUS
  Filled 2016-04-24: qty 1
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
## 2016-04-24 NOTE — ED Notes (Signed)
Pt placed back on bedside monitor per RN.
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
## 2016-04-25 NOTE — ED Notes (Signed)
Discharge instructions reviewed - voiced understanding 

## 2016-04-25 NOTE — ED Notes (Signed)
Patient ambulated to the BR without difficulty back to the room to get dressed

## 2016-05-17 ENCOUNTER — Encounter: Payer: Self-pay | Admitting: Neurology

## 2016-05-17 ENCOUNTER — Ambulatory Visit (INDEPENDENT_AMBULATORY_CARE_PROVIDER_SITE_OTHER): Payer: Self-pay | Admitting: Neurology

## 2016-05-17 VITALS — BP 109/72 | HR 72 | Resp 20 | Ht 60.0 in | Wt 121.0 lb

## 2016-05-17 DIAGNOSIS — F0781 Postconcussional syndrome: Secondary | ICD-10-CM

## 2016-05-17 MED ORDER — CYCLOBENZAPRINE HCL 5 MG PO TABS: 5.0000 mg | ORAL_TABLET | Freq: Every day | ORAL | 1 refills | Status: DC
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
## 2016-05-17 NOTE — Patient Instructions (Signed)
Remember to drink plenty of fluid, eat healthy meals and do not skip any meals. Try to eat protein with a every meal and eat a healthy snack such as fruit or nuts in between meals. Try to keep a regular sleep-wake schedule and try to exercise daily, particularly in the form of walking, 20-30 minutes a day, if you can.   As far as your medications are concerned, I would like to suggest: Flexeril 5mg  at bedtime  I would like to see you back as needed, sooner if we need to. Please call us with any interim questions, concerns, problems, updates or refill requests.  Cyclobenzaprine tablets What is this medicine? CYCLOBENZAPRINE (sye kloe BEN za preen) is a muscle relaxer. It is used to treat muscle pain, spasms, and stiffness. This medicine may be used for other purposes; ask your health care provider or pharmacist if you have questions. What should I tell my health care provider before I take this medicine? They need to know if you have any of these conditions: -heart disease, irregular heartbeat, or previous heart attack -liver disease -thyroid problem -an unusual or allergic reaction to cyclobenzaprine, tricyclic antidepressants, lactose, other medicines, foods, dyes, or preservatives -pregnant or trying to get pregnant -breast-feeding How should I use this medicine? Take this medicine by mouth with a glass of water. Follow the directions on the prescription label. If this medicine upsets your stomach, take it with food or milk. Take your medicine at regular intervals. Do not take it more often than directed. Talk to your pediatrician regarding the use of this medicine in children. Special care may be needed. Overdosage: If you think you have taken too much of this medicine contact a poison control center or emergency room at once. NOTE: This medicine is only for you. Do not share this medicine with others. What if I miss a dose? If you miss a dose, take it as soon as you can. If it is almost  time for your next dose, take only that dose. Do not take double or extra doses. What may interact with this medicine? Do not take this medicine with any of the following medications: -certain medicines for fungal infections like fluconazole, itraconazole, ketoconazole, posaconazole, voriconazole -cisapride -dofetilide -dronedarone -droperidol -flecainide -grepafloxacin -halofantrine -levomethadyl -MAOIs like Carbex, Eldepryl, Marplan, Nardil, and Parnate -nilotinib -pimozide -probucol -sertindole -thioridazine -ziprasidone This medicine may also interact with the following medications: -abarelix -alcohol -certain medicines for cancer -certain medicines for depression, anxiety, or psychotic disturbances -certain medicines for infection like alfuzosin, chloroquine, clarithromycin, levofloxacin, mefloquine, pentamidine, troleandomycin -certain medicines for an irregular heart beat -certain medicines used for sleep or numbness during surgery or procedure -contrast dyes -dolasetron -guanethidine -methadone -octreotide -ondansetron -other medicines that prolong the QT interval (cause an abnormal heart rhythm) -palonosetron -phenothiazines like chlorpromazine, mesoridazine, prochlorperazine, thioridazine -tramadol -vardenafil This list may not describe all possible interactions. Give your health care provider a list of all the medicines, herbs, non-prescription drugs, or dietary supplements you use. Also tell them if you smoke, drink alcohol, or use illegal drugs. Some items may interact with your medicine. What should I watch for while using this medicine? Check with your doctor or health care professional if your condition does not improve within 1 to 3 weeks. You may get drowsy or dizzy when you first start taking the medicine or change doses. Do not drive, use machinery, or do anything that may be dangerous until you know how the medicine affects you. Stand or sit up  slowly.  Your mouth may get dry. Drinking water, chewing sugarless gum, or sucking on hard candy may help. What side effects may I notice from receiving this medicine? Side effects that you should report to your doctor or health care professional as soon as possible: -allergic reactions like skin rash, itching or hives, swelling of the face, lips, or tongue -chest pain -fast heartbeat -hallucinations -seizures -vomiting Side effects that usually do not require medical attention (report to your doctor or health care professional if they continue or are bothersome): -headache This list may not describe all possible side effects. Call your doctor for medical advice about side effects. You may report side effects to FDA at 1-800-FDA-1088. Where should I keep my medicine? Keep out of the reach of children. Store at room temperature between 15 and 30 degrees C (59 and 86 degrees F). Keep container tightly closed. Throw away any unused medicine after the expiration date. NOTE: This sheet is a summary. It may not cover all possible information. If you have questions about this medicine, talk to your doctor, pharmacist, or health care provider.    2016, Elsevier/Gold Standard. (2013-02-03 12:48:19)  Our phone number is [phone]. We also have an after hours call service for urgent matters and there is a physician on-call for urgent questions. For any emergencies you know to call 911 or go to the nearest emergency room  Post-Concussion Syndrome Post-concussion syndrome describes the symptoms that can occur after a head injury. These symptoms can last from weeks to months. CAUSES  It is not clear why some head injuries cause post-concussion syndrome. It can occur whether your head injury was mild or severe and whether you were wearing head protection or not.  SIGNS AND SYMPTOMS  Memory difficulties.  Dizziness.  Headaches.  Double vision or blurry vision.  Sensitivity to light.  Hearing  difficulties.  Depression.  Tiredness.  Weakness.  Difficulty with concentration.  Difficulty sleeping or staying asleep.  Vomiting.  Poor balance or instability on your feet.  Slow reaction time.  Difficulty learning and remembering things you have heard. DIAGNOSIS  There is no test to determine whether you have post-concussion syndrome. Your health care provider may order an imaging scan of your brain, such as a CT scan, to check for other problems that may be causing your symptoms (such as a severe injury inside your skull). TREATMENT  Usually, these problems disappear over time without medical care. Your health care provider may prescribe medicine to help ease your symptoms. It is important to follow up with a neurologist to evaluate your recovery and address any lingering symptoms or issues. HOME CARE INSTRUCTIONS   Take medicines only as directed by your health care provider. Do not take aspirin. Aspirin can slow blood clotting.  Sleep with your head slightly elevated to help with headaches.  Avoid any situation where there is potential for another head injury. This includes football, hockey, soccer, basketball, martial arts, downhill snow sports, and horseback riding. Your condition will get worse every time you experience a concussion. You should avoid these activities until you are evaluated by the appropriate follow-up health care providers.  Keep all follow-up visits as directed by your health care provider. This is important. SEEK MEDICAL CARE IF:  You have increased problems paying attention or concentrating.  You have increased difficulty remembering or learning new information.  You need more time to complete tasks or assignments than before.  You have increased irritability or decreased ability to cope with stress.  You  have more symptoms than before. Seek medical care if you have any of the following symptoms for more than two weeks after your  injury:  Lasting (chronic) headaches.  Dizziness or balance problems.  Nausea.  Vision problems.  Increased sensitivity to noise or light.  Depression or mood swings.  Anxiety or irritability.  Memory problems.  Difficulty concentrating or paying attention.  Sleep problems.  Feeling tired all the time. SEEK IMMEDIATE MEDICAL CARE IF:  You have confusion or unusual drowsiness.  Others find it difficult to wake you up.  You have nausea or persistent, forceful vomiting.  You feel like you are moving when you are not (vertigo). Your eyes may move rapidly back and forth.  You have convulsions or faint.  You have severe, persistent headaches that are not relieved by medicine.  You cannot use your arms or legs normally.  One of your pupils is larger than the other.  You have clear or bloody discharge from your nose or ears.  Your problems are getting worse, not better. MAKE SURE YOU:  Understand these instructions.  Will watch your condition.  Will get help right away if you are not doing well or get worse.   This information is not intended to replace advice given to you by your health care provider. Make sure you discuss any questions you have with your health care provider.   Document Released: 12/29/2001 Document Revised: 07/30/2014 Document Reviewed: 10/14/2013 Elsevier Interactive Patient Education [employer].

## 2016-05-21 ENCOUNTER — Telehealth: Payer: Self-pay | Admitting: Neurology

## 2016-05-21 ENCOUNTER — Encounter: Payer: Self-pay | Admitting: Neurology

## 2016-05-21 NOTE — Telephone Encounter (Signed)
Write letter for patient:   I hope you're well. I wanted to follow up from my appointment with you on last Thursday and request my letter of leave/return to work for [employer]. My PCP had written me out of work from October 3rd - 27th of October until my appointment with you. If you would be so kind as to write a letter of leave from October 27 to May 24, 2016 with a return to work date of November 3rd for 3 half days (afternoons) a week. I will email you towards the end of next week to inform you of how I progressed and if all goes well would you then be able to increase me to 5 half days a week? If so, I would just need another letter sent disclosing the work increase at that time? One final note, we discussed limited computer use as my position requires an extended amount of time entering information. Could you also make a note of this within your letter? You can address it to [facility] and fax it to [phone]. In appreciation of your time, Jennifer Manning

## 2016-05-22 ENCOUNTER — Encounter: Payer: Self-pay | Admitting: Neurology

## 2016-05-22 NOTE — Telephone Encounter (Signed)
Called and spoke to pt. Read letter to pt. She stated this letter was ok, nothing further needed on letter. Advised I will fax as requested.  Faxed letter to [phone]. Received confirmation. Also mailed copy of letter to pt for her records.   She requested Dr Jaynee Eagles fill out Certification of health care provider for serious health condition (FMLA) for her time out of work from 05/17/16-05/25/16. She is going to drop off paperwork to our office. Advised there is a 50 dollar fee with form. Dr Jaynee Eagles has 14 days to complete. She verbalized understanding.

## 2016-05-22 NOTE — Telephone Encounter (Signed)
Please fax note to school and send to patient, let her know it is completed and read it to her thanks

## 2016-06-10 ENCOUNTER — Encounter: Payer: Self-pay | Admitting: Neurology

## 2016-06-12 ENCOUNTER — Telehealth: Payer: Self-pay | Admitting: *Deleted

## 2016-06-12 NOTE — Telephone Encounter (Signed)
I LMVM for pt that forms completed and sending to Dr.  Jaynee Eagles to complete and sign.  Have her working at reduced schedule until 06-25-16 then full-time.

## 2016-06-12 NOTE — Telephone Encounter (Signed)
Patient returned Sandy's call, please call [phone].

## 2016-06-12 NOTE — Telephone Encounter (Signed)
I spoke to pt and she relayed that she had emailed earlier today.    She is asking to try going back half days for 5 days next week.  If so I can place on her fmla and std forms as well.   ( I did 3 half days per week until 06/25/16 on her forms initially).  They are on Emma's desk.

## 2016-06-12 NOTE — Telephone Encounter (Signed)
That is fine, mind stopping by and telling me what to sign? Thanks!

## 2016-06-13 NOTE — Telephone Encounter (Signed)
To Debra in [place].  

## 2016-06-13 NOTE — Telephone Encounter (Signed)
Completed and signed.  Called pt to let her know will fax today.

## 2016-06-22 ENCOUNTER — Encounter: Payer: Self-pay | Admitting: Neurology

## 2016-06-26 ENCOUNTER — Telehealth: Payer: Self-pay | Admitting: Neurology

## 2016-06-26 ENCOUNTER — Encounter: Payer: Self-pay | Admitting: *Deleted

## 2016-06-26 ENCOUNTER — Other Ambulatory Visit: Payer: Self-pay | Admitting: Neurology

## 2016-06-26 MED ORDER — ONDANSETRON 4 MG PO TBDP: 4.0000 mg | ORAL_TABLET | Freq: Three times a day (TID) | ORAL | 3 refills | Status: DC | PRN

## 2016-06-26 NOTE — Telephone Encounter (Signed)
Called patient back. Went over [employer] of medication and that she would only take on prn basis. Not daily. Verfieid she wanted rx to be sent to [facility] in [place]. Advised we will send that today. She verbalized understanding and is willing to try this.

## 2016-06-26 NOTE — Telephone Encounter (Signed)
Jennifer Manning, patient emailed me back would like anti-nausea pills. I can give her ondansetron. The side effects as below, please discuss with patient and see if she is willing. Just as needed.  Ondansetron oral dissolving tablet What side effects may I notice from receiving this medicine? Side effects that you should report to your doctor or health care professional as soon as possible: -allergic reactions like skin rash, itching or hives, swelling of the face, lips, or tongue -breathing problems -confusion -dizziness -fast or irregular heartbeat -feeling faint or lightheaded, falls -fever and chills -loss of balance or coordination -seizures -sweating -swelling of the hands and feet -tightness in the chest -tremors -unusually weak or tired Side effects that usually do not require medical attention (report to your doctor or health care professional if they continue or are bothersome): -constipation or diarrhea -headache

## 2016-06-27 DIAGNOSIS — Z0289 Encounter for other administrative examinations: Secondary | ICD-10-CM

## 2016-07-06 ENCOUNTER — Encounter: Payer: Self-pay | Admitting: Neurology

## 2016-07-09 ENCOUNTER — Telehealth: Payer: Self-pay | Admitting: Neurology

## 2016-07-09 DIAGNOSIS — R42 Dizziness and giddiness: Secondary | ICD-10-CM

## 2016-07-09 DIAGNOSIS — F0781 Postconcussional syndrome: Secondary | ICD-10-CM

## 2016-07-09 NOTE — Telephone Encounter (Signed)
Called and LVM for pt relaying AA,MD message below. Asked her to call back to discuss. Gave [facility] phone number.

## 2016-07-09 NOTE — Addendum Note (Signed)
Addended by: Edison Pace, Cayley Pester L on: 07/09/2016 12:12 PM   Modules accepted: Orders

## 2016-07-09 NOTE — Telephone Encounter (Signed)
Dr Jaynee Eagles- see mychart message. She sent it this past Friday. Would you like me to address this in any way?

## 2016-07-09 NOTE — Telephone Encounter (Signed)
We can extend her half days for as long as she needs thanks

## 2016-07-09 NOTE — Telephone Encounter (Signed)
Pt called inquiring when email would be addressed. She was advised it has been sent to Dr A and Rn will call her later.

## 2016-07-09 NOTE — Telephone Encounter (Addendum)
Called and spoke with patient. Advised per AA,MD that she would recommend physical therapy for her dizziness/concussion symptoms. She is agreeable to this. I explained referral process to her. Placed referral per AA,MD request.   She would also like to have extension of working 5 half-days. She stated through January 5th, 2018 would be good. She is wondering if she wants to extend it farther than that until she sees how PT is helping. Advised I will have to speak with AA,MD and call back to advise. She verbalized understanding.

## 2016-07-10 ENCOUNTER — Encounter: Payer: Self-pay | Admitting: *Deleted

## 2016-07-10 NOTE — Telephone Encounter (Signed)
Awaiting AA,MD signature on letter

## 2016-07-10 NOTE — Telephone Encounter (Signed)
Patient called office back. She would like to do 5 half days through 2/5. This will give her time to go to PT and see if this helps with sx. She is wanting to pick up letter today. Advised I will try and get this ready for her to pick up. She verbalized understanding.

## 2016-07-10 NOTE — Telephone Encounter (Signed)
Placed letter up front for pick up. 

## 2016-07-11 ENCOUNTER — Encounter: Payer: Self-pay | Admitting: *Deleted

## 2016-07-11 NOTE — Telephone Encounter (Signed)
Called and LVM for pt apologizing. Advised I corrected date and re-faxed. Advised I will placed up front for pick up if she wanted to still pick it up also. Gave [facility] phone number if she has further questions/concerns.

## 2016-07-11 NOTE — Telephone Encounter (Signed)
Pt request letter to be faxed if possible to [phone].

## 2016-07-11 NOTE — Telephone Encounter (Signed)
Faxed letter as requested. Received confirmation.  

## 2016-07-11 NOTE — Telephone Encounter (Addendum)
Patient is calling back saying the date is incorrect in the letter It should be 08-27-16. The patient will pick up letter today if possible. She will call before coming to pickup.

## 2016-07-20 ENCOUNTER — Ambulatory Visit: Payer: BC Managed Care – PPO | Attending: Neurology | Admitting: Physical Therapy

## 2016-07-20 DIAGNOSIS — R293 Abnormal posture: Secondary | ICD-10-CM | POA: Diagnosis present

## 2016-07-20 DIAGNOSIS — M542 Cervicalgia: Secondary | ICD-10-CM | POA: Insufficient documentation

## 2016-07-20 DIAGNOSIS — R42 Dizziness and giddiness: Secondary | ICD-10-CM | POA: Insufficient documentation

## 2016-07-20 NOTE — Patient Instructions (Signed)
Axial Extension (Chin Tuck)    Pull chin in and lengthen back of neck. Hold __5__ seconds while counting out loud. Repeat _10-15___ times. Do _2-3___ sessions per day.   Gaze Stabilization: Sitting     Post-it note with letter - turn head 10-15 times Dizziness should go up - rest until next set 2 times day
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
## 2016-07-24 ENCOUNTER — Ambulatory Visit: Payer: BC Managed Care – PPO | Attending: Neurology | Admitting: Physical Therapy

## 2016-07-24 DIAGNOSIS — R293 Abnormal posture: Secondary | ICD-10-CM | POA: Diagnosis present

## 2016-07-24 DIAGNOSIS — R42 Dizziness and giddiness: Secondary | ICD-10-CM | POA: Insufficient documentation

## 2016-07-24 DIAGNOSIS — M542 Cervicalgia: Secondary | ICD-10-CM | POA: Diagnosis present

## 2016-07-24 NOTE — Therapy (Signed)
[facility] [address]  [place], [place], [address] Phone: [phone]   Fax:  [phone]  Physical Therapy Treatment  Patient Details  Name: Jennifer Manning MRN: [id number] Date of Birth: [birth year] Referring Provider: Dr. Melvenia Beam  Encounter Date: 07/24/2016      PT End of Session - 07/24/16 1652    Visit Number 2   Number of Visits 12   Date for PT Re-Evaluation 08/31/16   PT Start Time 1652   PT Stop Time 1738   PT Time Calculation (min) 46 min   Activity Tolerance Patient tolerated treatment well   Behavior During Therapy [facility] for tasks assessed/performed      Past Medical History:  Diagnosis Date  . Migraines     Past Surgical History:  Procedure Laterality Date  . TONSILLECTOMY    . TUBAL LIGATION      There were no vitals filed for this visit.      Subjective Assessment - 07/24/16 1655    Subjective Pt noting more fatigue over the holidays. Symptoms essentially unchanged from eval.   Patient Stated Goals return to work, reduced headachs and dizziness   Currently in Pain? Yes   Pain Score 4    Pain Location Neck   Pain Orientation Posterior;Right   Pain Descriptors / Indicators Aching   Pain Onset More than a month ago            [facility] PT Assessment - 07/24/16 1652      Observation/Other Assessments   Other Surveys  --  Motion Sensitivity Score = 26.66%                     [facility] Adult PT Treatment/Exercise - 07/24/16 0001      Exercises   Exercises Neck     Neck Exercises: Seated   Other Seated Exercise Scap retraction 10x5"     Manual Therapy   Manual Therapy Manual Traction;Other (comment)   Manual Traction gentle cervical traction in neutral and slight flexion 3x30'   Other Manual Therapy manual UT, LS & pec stretches in supine     Neck Exercises: Stretches   Upper Trapezius Stretch 30 seconds;1 rep   Levator Stretch 30 seconds;1 rep   Corner  Stretch 30 seconds;3 reps   Corner Stretch Limitations low/mid/high doorway stretch   Other Neck Stretches hooklying pec stretch with arms at ~90 dg ABD x60" (instructed pt to perform lying over corner of bed)                PT Education - 07/24/16 1739    Education Details HEP update - postural stretches and scap retraction   Person(s) Educated Patient   Methods Explanation;Demonstration;Handout   Comprehension Verbalized understanding;Returned demonstration;Need further instruction             PT Long Term Goals - 07/24/16 1715      PT LONG TERM GOAL #1   Title patient to be independent with HEP (08/31/16)   Status On-going     PT LONG TERM GOAL #2   Title Patient to demonstrate active cervical ROM with no increase in pain for improved function (08/31/16)   Status On-going     PT LONG TERM GOAL #3   Title Patient to verbalize and demonstrate proper posture/body mechanics for reduced stress placed on neck/upper back musculature (08/31/16)   Status On-going     PT LONG TERM GOAL #4  Title Patient to report ability to navigate busy environment with dizziness no greater than 2/10 (08/31/16)   Status On-going     PT LONG TERM GOAL #5   Title Patient to report dizziness no greater than 1/10 with all head movements and positional changes (08/31/16)   Status On-going     PT LONG TERM GOAL #6   Title Motion sensitivty quotient TBD   Status Achieved               Plan - 07/24/16 1746    Clinical Impression Statement Motion senstivity score tested today with pt demonstrating moderate sensitivity with score of 26.66%, but no striking profound provoking motions. Performed soft tissue assessment with increased tension/pain noted in B UT/LS and L pecs, therefore provided education in gentle stretching for home along with postural stretngthening with scap retraction.   Rehab Potential Good   PT Treatment/Interventions ADLs/Self Care Home Management;Electrical  Stimulation;Moist Heat;Traction;Ultrasound;Therapeutic exercise;Therapeutic activities;Patient/family education;Manual techniques;Passive range of motion;Vestibular;Visual/perceptual remediation/compensation;Taping   PT Next Visit Plan Review HEP, progress VOR as tolerated   Consulted and Agree with Plan of Care Patient      Patient will benefit from skilled therapeutic intervention in order to improve the following deficits and impairments:  Decreased activity tolerance, Decreased balance, Decreased range of motion, Dizziness, Pain  Visit Diagnosis: Dizziness and giddiness  Cervicalgia  Abnormal posture     Problem List Patient Active Problem List   Diagnosis Date Noted  . Post concussion syndrome 05/17/2016  . Vitamin D deficiency 12/20/2013    Percival Spanish, PT, MPT 07/24/2016, 5:59 PM  [facility] [address]  [place], [place], [address] Phone: [phone]   Fax:  [phone]  Name: OTHELL FORNASH MRN: [id number] Date of Birth: [birth year]

## 2016-07-30 ENCOUNTER — Ambulatory Visit: Payer: BC Managed Care – PPO | Admitting: Physical Therapy

## 2016-07-31 ENCOUNTER — Ambulatory Visit: Payer: BC Managed Care – PPO | Admitting: Physical Therapy

## 2016-07-31 DIAGNOSIS — M542 Cervicalgia: Secondary | ICD-10-CM

## 2016-07-31 DIAGNOSIS — R293 Abnormal posture: Secondary | ICD-10-CM

## 2016-07-31 DIAGNOSIS — R42 Dizziness and giddiness: Secondary | ICD-10-CM

## 2016-07-31 NOTE — Therapy (Signed)
[facility] [address]  [place], [place], [address] Phone: [phone]   Fax:  [phone]  Physical Therapy Treatment  Patient Details  Name: Jennifer Manning MRN: [id number] Date of Birth: [birth year] Referring Provider: Dr. Melvenia Beam  Encounter Date: 07/31/2016      PT End of Session - 07/31/16 1757    Visit Number 3   Number of Visits 12   Date for PT Re-Evaluation 08/31/16   PT Start Time 1710   PT Stop Time 1751   PT Time Calculation (min) 41 min   Activity Tolerance Patient tolerated treatment well   Behavior During Therapy [facility] for tasks assessed/performed      Past Medical History:  Diagnosis Date  . Migraines     Past Surgical History:  Procedure Laterality Date  . TONSILLECTOMY    . TUBAL LIGATION      There were no vitals filed for this visit.      Subjective Assessment - 07/31/16 1751    Subjective Patient doing well today - chiro is weaning patient off of cervical collar - feels like she has a little more pain due to this.    Diagnostic tests Xray and MRI - no diagnostic findings   Patient Stated Goals return to work, reduced headachs and dizziness   Currently in Pain? Yes   Pain Score 3    Pain Location Neck   Pain Orientation Right;Left;Posterior   Pain Descriptors / Indicators Aching;Tightness   Pain Type Acute pain   Pain Onset More than a month ago   Pain Frequency Intermittent                         [facility] Adult PT Treatment/Exercise - 07/31/16 0001      Neck Exercises: Seated   Neck Retraction 10 reps   Other Seated Exercise scap retraction - 12 reps - reviewing form     Manual Therapy   Manual Therapy Soft tissue mobilization;Myofascial release;Manual Traction;Passive ROM   Soft tissue mobilization STM to B cervical paraspinals, B UT, B levator scap   Myofascial Release manual trigger point release to B UT, B cervical paraspinals; suboccipital  release 3 x 1 minute   Passive ROM B cervical flexion for B UT stretch 2 x 1 minute each side   Manual Traction gentle cervical traction in neutral and slight flexion 3x30'     Neck Exercises: Stretches   Upper Trapezius Stretch 2 reps;60 seconds  PT led   Levator Stretch 2 reps;30 seconds  HEP review   Warehouse manager 30 seconds;3 reps   Corner Stretch Limitations low/mid/high doorway stretch - HEP review         Vestibular Treatment/Exercise - 07/31/16 0001      Vestibular Treatment/Exercise   Gaze Exercises X2 Viewing Horizontal;X2 Viewing Vertical  mild increase in dizziness     X2 Viewing Horizontal   Foot Position --  seated   Reps 10     X2 Viewing Vertical   Foot Position --  seated   Reps 10                    PT Long Term Goals - 07/24/16 1715      PT LONG TERM GOAL #1   Title patient to be independent with HEP (08/31/16)   Status On-going     PT LONG TERM GOAL #2   Title Patient to  demonstrate active cervical ROM with no increase in pain for improved function (08/31/16)   Status On-going     PT LONG TERM GOAL #3   Title Patient to verbalize and demonstrate proper posture/body mechanics for reduced stress placed on neck/upper back musculature (08/31/16)   Status On-going     PT LONG TERM GOAL #4   Title Patient to report ability to navigate busy environment with dizziness no greater than 2/10 (08/31/16)   Status On-going     PT LONG TERM GOAL #5   Title Patient to report dizziness no greater than 1/10 with all head movements and positional changes (08/31/16)   Status On-going     PT LONG TERM GOAL #6   Title Motion sensitivty quotient TBD   Status Achieved               Plan - 07/31/16 1757    Clinical Impression Statement Patient today with subjective reports of some increase in neck pain as chiro is weaning patient out of cervical collar. Patient also reporting that she feels as if she is progressing with overall dizziness and neck  pain symptoms, however does continue to be neasueated. Continued tissue tightness noted throughout B cervical paraspinals, B UT and B levator scap with manual therapy being primary focus of treatment today. Progression of VOR x 2 exercises today with mild increase in dizziness. Patient to continue to benefit from PT to maximize function and improve quality of life.    PT Treatment/Interventions ADLs/Self Care Home Management;Electrical Stimulation;Moist Heat;Traction;Ultrasound;Therapeutic exercise;Therapeutic activities;Patient/family education;Manual techniques;Passive range of motion;Vestibular;Visual/perceptual remediation/compensation;Taping   PT Next Visit Plan Review HEP, progress VOR as tolerated, manual therapy for improve tissue quality, modalities prn as needed   Consulted and Agree with Plan of Care Patient      Patient will benefit from skilled therapeutic intervention in order to improve the following deficits and impairments:  Decreased activity tolerance, Decreased balance, Decreased range of motion, Dizziness, Pain  Visit Diagnosis: Dizziness and giddiness  Cervicalgia  Abnormal posture     Problem List Patient Active Problem List   Diagnosis Date Noted  . Post concussion syndrome 05/17/2016  . Vitamin D deficiency 12/20/2013     Lanney Gins, PT, DPT 07/31/16 [facility] [address]  [place], [place], [address] Phone: [phone]   Fax:  [phone]  Name: AJAH BAIL MRN: [id number] Date of Birth: 12/31/1970

## 2016-08-02 ENCOUNTER — Ambulatory Visit: Payer: BC Managed Care – PPO | Admitting: Physical Therapy

## 2016-08-02 ENCOUNTER — Encounter: Payer: BC Managed Care – PPO | Admitting: Physical Therapy

## 2016-08-02 DIAGNOSIS — M542 Cervicalgia: Secondary | ICD-10-CM

## 2016-08-02 DIAGNOSIS — R293 Abnormal posture: Secondary | ICD-10-CM

## 2016-08-02 DIAGNOSIS — R42 Dizziness and giddiness: Secondary | ICD-10-CM | POA: Diagnosis not present

## 2016-08-02 NOTE — Therapy (Signed)
[facility] [address]  [place], [place], [address] Phone: [phone]   Fax:  [phone]  Physical Therapy Treatment  Patient Details  Name: Jennifer Manning MRN: [id number] Date of Birth: 1971/04/04 Referring Provider: Dr. Melvenia Beam  Encounter Date: 08/02/2016      PT End of Session - 08/02/16 1424    Visit Number 4   Number of Visits 12   Date for PT Re-Evaluation 08/31/16   PT Start Time 1315   PT Stop Time 1416   PT Time Calculation (min) 61 min   Activity Tolerance Patient tolerated treatment well   Behavior During Therapy [facility] for tasks assessed/performed      Past Medical History:  Diagnosis Date  . Migraines     Past Surgical History:  Procedure Laterality Date  . TONSILLECTOMY    . TUBAL LIGATION      There were no vitals filed for this visit.      Subjective Assessment - 08/02/16 1317    Subjective Doing well - feels some soreness in L posterior neck which may be due to increased activity   Diagnostic tests Xray and MRI - no diagnostic findings   Patient Stated Goals return to work, reduced headachs and dizziness   Currently in Pain? Yes   Pain Score 4    Pain Location Neck   Pain Orientation Right;Left;Posterior                         [facility] Adult PT Treatment/Exercise - 08/02/16 0001      Neck Exercises: Seated   Neck Retraction 10 reps   Other Seated Exercise scap retraction - 12 reps      Modalities   Modalities Electrical Stimulation;Moist Heat     Moist Heat Therapy   Number Minutes Moist Heat 15 Minutes   Moist Heat Location Cervical     Electrical Stimulation   Electrical Stimulation Location B cervical paraspinals to B UT   Electrical Stimulation Action IFC   Electrical Stimulation Parameters to tolerance   Electrical Stimulation Goals Pain;Tone     Manual Therapy   Manual Therapy Joint mobilization;Soft tissue mobilization;Myofascial  release   Manual therapy comments patient hooklying - bolster under knees   Joint Mobilization Grade I-II CPA from approx C3-Ti   Soft tissue mobilization STM to B cervical paraspinals, B UT, B levator scap   Myofascial Release manual trigger point release to B UT, B cervical paraspinals; suboccipital release 3 x 1 minute   Passive ROM B cervical lateral flexion for B UT stretch 2 x 1 minute each side     Neck Exercises: Stretches   Upper Trapezius Stretch 2 reps;60 seconds  bilateral; PT led   Levator Stretch --  HEP review   Corner Stretch Limitations HEP review - mid gives patient most stretch         Vestibular Treatment/Exercise - 08/02/16 0001      Vestibular Treatment/Exercise   Gaze Exercises X2 Viewing Horizontal;X2 Viewing Vertical     X2 Viewing Horizontal   Foot Position --  seated   Reps 20     X2 Viewing Vertical   Foot Position --  seated   Reps 20                    PT Long Term Goals - 07/24/16 1715      PT LONG TERM GOAL #1  Title patient to be independent with HEP (08/31/16)   Status On-going     PT LONG TERM GOAL #2   Title Patient to demonstrate active cervical ROM with no increase in pain for improved function (08/31/16)   Status On-going     PT LONG TERM GOAL #3   Title Patient to verbalize and demonstrate proper posture/body mechanics for reduced stress placed on neck/upper back musculature (08/31/16)   Status On-going     PT LONG TERM GOAL #4   Title Patient to report ability to navigate busy environment with dizziness no greater than 2/10 (08/31/16)   Status On-going     PT LONG TERM GOAL #5   Title Patient to report dizziness no greater than 1/10 with all head movements and positional changes (08/31/16)   Status On-going     PT LONG TERM GOAL #6   Title Motion sensitivty quotient TBD   Status Achieved               Plan - 08/02/16 1424    Clinical Impression Statement Patient doing well today - some subjective reports  of increased pain of L posterior neck which may in part be due to increased activity without use of cervical collar. Patient with good compliance of HEP with reports of dizziness during VOR x2 activities requiring rest breaks to complete task. Review of VOR x 2 today with slight increase in dizziness, however by end of session dizziness resolved. Estim and moist heat at end of treatment for pain relief with pain reduced to 2/10.    PT Treatment/Interventions ADLs/Self Care Home Management;Electrical Stimulation;Moist Heat;Traction;Ultrasound;Therapeutic exercise;Therapeutic activities;Patient/family education;Manual techniques;Passive range of motion;Vestibular;Visual/perceptual remediation/compensation;Taping   PT Next Visit Plan Review HEP, progress VOR as tolerated, manual therapy for improve tissue quality, modalities prn as needed   Consulted and Agree with Plan of Care Patient      Patient will benefit from skilled therapeutic intervention in order to improve the following deficits and impairments:  Decreased activity tolerance, Decreased balance, Decreased range of motion, Dizziness, Pain  Visit Diagnosis: Dizziness and giddiness  Cervicalgia  Abnormal posture     Problem List Patient Active Problem List   Diagnosis Date Noted  . Post concussion syndrome 05/17/2016  . Vitamin D deficiency 12/20/2013     Lanney Gins, PT, DPT 08/02/16 2:29 PM   [facility] [address]  [place], [place], [address] Phone: [phone]   Fax:  [phone]  Name: Jennifer Manning MRN: [id number] Date of Birth: 15-Jul-1971

## 2016-08-06 ENCOUNTER — Ambulatory Visit: Payer: BC Managed Care – PPO | Admitting: Physical Therapy

## 2016-08-07 ENCOUNTER — Ambulatory Visit: Payer: BC Managed Care – PPO | Admitting: Physical Therapy

## 2016-08-07 DIAGNOSIS — R42 Dizziness and giddiness: Secondary | ICD-10-CM | POA: Diagnosis not present

## 2016-08-07 DIAGNOSIS — M542 Cervicalgia: Secondary | ICD-10-CM

## 2016-08-07 DIAGNOSIS — R293 Abnormal posture: Secondary | ICD-10-CM

## 2016-08-07 NOTE — Therapy (Signed)
[facility] [address]  [place], [place], [address] Phone: [phone]   Fax:  [phone]  Physical Therapy Treatment  Patient Details  Name: Jennifer Manning MRN: [id number] Date of Birth: March 22, 1971 Referring Provider: Dr. Melvenia Beam  Encounter Date: 08/07/2016      PT End of Session - 08/07/16 0902    Visit Number 5   Number of Visits 12   Date for PT Re-Evaluation 08/31/16   PT Start Time 0901   PT Stop Time 0932   PT Time Calculation (min) 31 min   Activity Tolerance Patient tolerated treatment well   Behavior During Therapy [facility] for tasks assessed/performed      Past Medical History:  Diagnosis Date  . Migraines     Past Surgical History:  Procedure Laterality Date  . TONSILLECTOMY    . TUBAL LIGATION      There were no vitals filed for this visit.      Subjective Assessment - 08/07/16 0901    Subjective sore soreness/achiness - has been doing exercises   Diagnostic tests Xray and MRI - no diagnostic findings   Patient Stated Goals return to work, reduced headachs and dizziness   Currently in Pain? Yes   Pain Score 2    Pain Location Neck   Pain Orientation Right;Left;Posterior   Pain Descriptors / Indicators Aching;Sore   Pain Type Acute pain   Pain Onset More than a month ago   Pain Frequency Intermittent                         [facility] Adult PT Treatment/Exercise - 08/07/16 0001      Neck Exercises: Seated   Neck Retraction 10 reps   Shoulder ABduction Both;15 reps   Shoulder Abduction Limitations horizontal with green tband - with scap squeeze   Other Seated Exercise ER with scap squeeze - green tband x 15 reps     Manual Therapy   Manual Therapy Joint mobilization;Soft tissue mobilization;Myofascial release   Manual therapy comments patient supine   Joint Mobilization Grade I-II CPA from approx C3-T1   Soft tissue mobilization STM to B cervical  paraspinals, B UT, B levator scap   Myofascial Release manual trigger point release to B UT, B cervical paraspinals; suboccipital release 1 x 1 minute   Passive ROM B cervical lateral flexion for B UT stretch 2 x 1 minute each side; B cervical rotation/flexion with patient supine 2 x 30 seconds each side for levator scap stretch     Neck Exercises: Stretches   Upper Trapezius Stretch 2 reps;60 seconds  PT led with patient supine   Levator Stretch 2 reps;30 seconds  PT led with patient supine   Corner Stretch Limitations HEP review                     PT Long Term Goals - 07/24/16 1715      PT LONG TERM GOAL #1   Title patient to be independent with HEP (08/31/16)   Status On-going     PT LONG TERM GOAL #2   Title Patient to demonstrate active cervical ROM with no increase in pain for improved function (08/31/16)   Status On-going     PT LONG TERM GOAL #3   Title Patient to verbalize and demonstrate proper posture/body mechanics for reduced stress placed on neck/upper back musculature (08/31/16)   Status On-going  PT LONG TERM GOAL #4   Title Patient to report ability to navigate busy environment with dizziness no greater than 2/10 (08/31/16)   Status On-going     PT LONG TERM GOAL #5   Title Patient to report dizziness no greater than 1/10 with all head movements and positional changes (08/31/16)   Status On-going     PT LONG TERM GOAL #6   Title Motion sensitivty quotient TBD   Status Achieved               Plan - 08/07/16 0934    Clinical Impression Statement Patient running late today, limiting treatment session. Patient with subjective reports of no nauseau and vomitting since last Thurday - which has been the longest lapse in time since onset. Patient reporting good compliance with HEP for stretching, strengthening, and VOR program. Patient with continued tissue tightness of B UT, B cervical paraspinals, and B suboccipital mm. Patient with improved  tolerance to manual therapy with reduction in repositioning of body and jaw. Patient to continue to benefit from PT to maximize function.    PT Treatment/Interventions ADLs/Self Care Home Management;Electrical Stimulation;Moist Heat;Traction;Ultrasound;Therapeutic exercise;Therapeutic activities;Patient/family education;Manual techniques;Passive range of motion;Vestibular;Visual/perceptual remediation/compensation;Taping   PT Next Visit Plan Review HEP, progress VOR as tolerated, manual therapy for improve tissue quality, modalities prn as needed   Consulted and Agree with Plan of Care Patient      Patient will benefit from skilled therapeutic intervention in order to improve the following deficits and impairments:  Decreased activity tolerance, Decreased balance, Decreased range of motion, Dizziness, Pain  Visit Diagnosis: Dizziness and giddiness  Cervicalgia  Abnormal posture     Problem List Patient Active Problem List   Diagnosis Date Noted  . Post concussion syndrome 05/17/2016  . Vitamin D deficiency 12/20/2013     Lanney Gins, PT, DPT 08/07/16 9:48 AM   [facility] [address]  [place], [place], [address] Phone: [phone]   Fax:  [phone]  Name: Jennifer Manning MRN: [id number] Date of Birth: May 02, 1971

## 2016-08-09 ENCOUNTER — Ambulatory Visit: Payer: BC Managed Care – PPO | Admitting: Physical Therapy

## 2016-08-13 ENCOUNTER — Ambulatory Visit: Payer: BC Managed Care – PPO | Admitting: Physical Therapy

## 2016-08-13 DIAGNOSIS — R42 Dizziness and giddiness: Secondary | ICD-10-CM

## 2016-08-13 DIAGNOSIS — R293 Abnormal posture: Secondary | ICD-10-CM

## 2016-08-13 DIAGNOSIS — M542 Cervicalgia: Secondary | ICD-10-CM

## 2016-08-13 NOTE — Patient Instructions (Signed)
Row: Mid-Range - Standing    With green band anchored at chest level, pull elbows backward, squeezing shoulder blades together. Keep head and spine neutral. Row _15__ times, _2__ times per day.   Horizontal Abduction (Resistive Band)    With arms at shoulder level, keep elbows straight. Using other arm as anchor, pull involved arm outward.  Repeat _15___ times. Do _2___ sessions per day. (thumbs in, palms down - both hand out with shoulder blade squeeze)

## 2016-08-13 NOTE — Therapy (Signed)
[facility] [address]  [place], [place], [address] Phone: [phone]   Fax:  [phone]  Physical Therapy Treatment  Patient Details  Name: Jennifer Manning MRN: [id number] Date of Birth: 1971-07-01 Referring Provider: Dr. Melvenia Beam  Encounter Date: 08/13/2016      PT End of Session - 08/13/16 1320    Visit Number 6   Number of Visits 12   Date for PT Re-Evaluation 08/31/16   PT Start Time 1318   PT Stop Time 1358   PT Time Calculation (min) 40 min   Activity Tolerance Patient tolerated treatment well   Behavior During Therapy [facility] for tasks assessed/performed      Past Medical History:  Diagnosis Date  . Migraines     Past Surgical History:  Procedure Laterality Date  . TONSILLECTOMY    . TUBAL LIGATION      There were no vitals filed for this visit.      Subjective Assessment - 08/13/16 1320    Subjective good comoliance with HEP - 2 N&V episodes   Diagnostic tests Xray and MRI - no diagnostic findings   Patient Stated Goals return to work, reduced headachs and dizziness   Currently in Pain? Yes   Pain Score 1    Pain Location Neck   Pain Orientation Right;Left;Posterior   Pain Descriptors / Indicators Aching;Sore   Pain Type Acute pain   Pain Onset More than a month ago   Pain Frequency Intermittent                         [facility] Adult PT Treatment/Exercise - 08/13/16 0001      Neck Exercises: Standing   Other Standing Exercises row with scap squeeze x 15 reps with green tband     Neck Exercises: Seated   Neck Retraction 10 reps   Shoulder ABduction Both;15 reps   Shoulder Abduction Limitations horizontal abduction - green tband with scap squeeze   Other Seated Exercise scap retraction - 12 reps      Manual Therapy   Manual Therapy Joint mobilization;Soft tissue mobilization;Myofascial release;Passive ROM   Manual therapy comments patient supine   Joint  Mobilization Grade I-II CPA from approx C3-T1 - slight pain at approx C4-C5   Soft tissue mobilization STM to B cervical paraspinals, B UT, B levator scap   Myofascial Release suboccipital release 2 x 1  minute   Passive ROM B cervical lateral flexion for B UT stretch 2 x 1 minute each side; B cervical rotation/flexion with patient supine 2 x 1 minute each side for levator scap stretch                     PT Long Term Goals - 07/24/16 1715      PT LONG TERM GOAL #1   Title patient to be independent with HEP (08/31/16)   Status On-going     PT LONG TERM GOAL #2   Title Patient to demonstrate active cervical ROM with no increase in pain for improved function (08/31/16)   Status On-going     PT LONG TERM GOAL #3   Title Patient to verbalize and demonstrate proper posture/body mechanics for reduced stress placed on neck/upper back musculature (08/31/16)   Status On-going     PT LONG TERM GOAL #4   Title Patient to report ability to navigate busy environment with dizziness no greater than 2/10 (  08/31/16)   Status On-going     PT LONG TERM GOAL #5   Title Patient to report dizziness no greater than 1/10 with all head movements and positional changes (08/31/16)   Status On-going     PT LONG TERM GOAL #6   Title Motion sensitivty quotient TBD   Status Achieved               Plan - 08/13/16 1321    Clinical Impression Statement Patient doing well today - improving function and mobility with reduced nausea and vomitting. Patient with improved tissue quality of upper back as noted with reduced tissude tightness with no noted trigger points. Patient with some continued tenderness to palpation along cervical paraspinals as well as slight pain with grade I-II CPAs of cervical spine. Patient with good compliance with HEP with additions made today. Patient to continue to benefit from PT to maximize function.    PT Treatment/Interventions ADLs/Self Care Home Management;Electrical  Stimulation;Moist Heat;Traction;Ultrasound;Therapeutic exercise;Therapeutic activities;Patient/family education;Manual techniques;Passive range of motion;Vestibular;Visual/perceptual remediation/compensation;Taping   PT Next Visit Plan progress VOR as tolerated, manual therapy for improve tissue quality, modalities prn as needed   Consulted and Agree with Plan of Care Patient      Patient will benefit from skilled therapeutic intervention in order to improve the following deficits and impairments:  Decreased activity tolerance, Decreased balance, Decreased range of motion, Dizziness, Pain  Visit Diagnosis: Dizziness and giddiness  Cervicalgia  Abnormal posture     Problem List Patient Active Problem List   Diagnosis Date Noted  . Post concussion syndrome 05/17/2016  . Vitamin D deficiency 12/20/2013     Lanney Gins, PT, DPT 08/13/16 3:52 PM   [facility] [address]  [place], [place], [address] Phone: [phone]   Fax:  [phone]  Name: CHELSYE FREW MRN: [id number] Date of Birth: 09-02-1970

## 2016-08-16 ENCOUNTER — Ambulatory Visit: Payer: BC Managed Care – PPO | Admitting: Physical Therapy

## 2016-08-16 DIAGNOSIS — M542 Cervicalgia: Secondary | ICD-10-CM

## 2016-08-16 DIAGNOSIS — R42 Dizziness and giddiness: Secondary | ICD-10-CM

## 2016-08-16 DIAGNOSIS — R293 Abnormal posture: Secondary | ICD-10-CM

## 2016-08-16 NOTE — Therapy (Signed)
[facility] [address]  [place], [place], [address] Phone: [phone]   Fax:  [phone]  Physical Therapy Treatment  Patient Details  Name: Jennifer Manning MRN: [id number] Date of Birth: Nov 12, 1970 Referring Provider: Dr. Melvenia Beam  Encounter Date: 08/16/2016      PT End of Session - 08/16/16 1027    Visit Number 7   Number of Visits 12   Date for PT Re-Evaluation 08/31/16   PT Start Time [id number]   PT Stop Time 1103   PT Time Calculation (min) 40 min   Activity Tolerance Patient tolerated treatment well   Behavior During Therapy [facility] for tasks assessed/performed      Past Medical History:  Diagnosis Date  . Migraines     Past Surgical History:  Procedure Laterality Date  . TONSILLECTOMY    . TUBAL LIGATION      There were no vitals filed for this visit.      Subjective Assessment - 08/16/16 1025    Subjective feels some stiffness - in neck and shoulder. Has had a headache since Tuesday   Diagnostic tests Xray and MRI - no diagnostic findings   Patient Stated Goals return to work, reduced headachs and dizziness   Currently in Pain? Yes   Pain Score 3    Pain Location Head   Pain Descriptors / Indicators Headache   Pain Type Acute pain   Pain Onset More than a month ago   Pain Frequency Intermittent                         [facility] Adult PT Treatment/Exercise - 08/16/16 0001      Neck Exercises: Seated   Neck Retraction 10 reps   Neck Retraction Limitations VC for closed mouth   Shoulder Abduction Limitations horizontal abduction with green tband - with added scap squeeze x 15 reps   Other Seated Exercise ER with scap squeeze - green tband x 15 reps     Manual Therapy   Manual Therapy Joint mobilization;Soft tissue mobilization;Myofascial release;Passive ROM   Manual therapy comments patient supine   Joint Mobilization Grade I-II CPA from approx C3-T1; good joint  movement, reduced pain   Soft tissue mobilization STM to B cervical paraspinals, B UT, B levator scap   Myofascial Release suboccipital release 3 x 1  minute   Passive ROM B cervical lateral flexion for B UT stretch 2 x 1 minute each side; B cervical rotation/flexion with patient supine 2 x 1 minute each side for levator scap stretch; B cervical flexion/rotation for levator scap stretch 1 x 1 minute each     Neck Exercises: Stretches   Upper Trapezius Stretch --  HEP review   Levator Stretch --  HEP review   Corner Stretch Limitations HEP review                     PT Long Term Goals - 07/24/16 1715      PT LONG TERM GOAL #1   Title patient to be independent with HEP (08/31/16)   Status On-going     PT LONG TERM GOAL #2   Title Patient to demonstrate active cervical ROM with no increase in pain for improved function (08/31/16)   Status On-going     PT LONG TERM GOAL #3   Title Patient to verbalize and demonstrate proper posture/body mechanics for reduced stress placed on neck/upper  back musculature (08/31/16)   Status On-going     PT LONG TERM GOAL #4   Title Patient to report ability to navigate busy environment with dizziness no greater than 2/10 (08/31/16)   Status On-going     PT LONG TERM GOAL #5   Title Patient to report dizziness no greater than 1/10 with all head movements and positional changes (08/31/16)   Status On-going     PT LONG TERM GOAL #6   Title Motion sensitivty quotient TBD   Status Achieved               Plan - 08/16/16 1302    Clinical Impression Statement Patient today with subjective reports of feeling of sickness which she believes may be flaring up some neck pain and dizziness due to lack of ability to perform HEP. Patient with some increase tissue tightness noted throughout neck and upper back musculature with patient responding well to manual therapy. Patient to continue to benefit from PT to maximize function.    PT  Treatment/Interventions ADLs/Self Care Home Management;Electrical Stimulation;Moist Heat;Traction;Ultrasound;Therapeutic exercise;Therapeutic activities;Patient/family education;Manual techniques;Passive range of motion;Vestibular;Visual/perceptual remediation/compensation;Taping   PT Next Visit Plan progress VOR as tolerated, manual therapy for improve tissue quality, modalities prn as needed   Consulted and Agree with Plan of Care Patient      Patient will benefit from skilled therapeutic intervention in order to improve the following deficits and impairments:  Decreased activity tolerance, Decreased balance, Decreased range of motion, Dizziness, Pain  Visit Diagnosis: Dizziness and giddiness  Cervicalgia  Abnormal posture     Problem List Patient Active Problem List   Diagnosis Date Noted  . Post concussion syndrome 05/17/2016  . Vitamin D deficiency 12/20/2013    Lanney Gins, PT, DPT 08/16/16 1:06 PM   [facility] [address]  [place], [place], [address] Phone: [phone]   Fax:  [phone]  Name: Jennifer Manning MRN: [id number] Date of Birth: 09-29-1970

## 2016-08-21 ENCOUNTER — Ambulatory Visit: Payer: BC Managed Care – PPO | Admitting: Physical Therapy

## 2016-08-21 DIAGNOSIS — M542 Cervicalgia: Secondary | ICD-10-CM

## 2016-08-21 DIAGNOSIS — R42 Dizziness and giddiness: Secondary | ICD-10-CM | POA: Diagnosis not present

## 2016-08-21 DIAGNOSIS — R293 Abnormal posture: Secondary | ICD-10-CM

## 2016-08-21 NOTE — Therapy (Signed)
[facility] [address]  [place], [place], [address] Phone: [phone]   Fax:  [phone]  Physical Therapy Treatment  Patient Details  Name: Jennifer Manning MRN: [id number] Date of Birth: 1971-07-20 Referring Provider: Dr. Melvenia Beam  Encounter Date: 08/21/2016      PT End of Session - 08/21/16 1535    Visit Number 8   Number of Visits 12   Date for PT Re-Evaluation 08/31/16   PT Start Time 1532   PT Stop Time 1615   PT Time Calculation (min) 43 min   Activity Tolerance Patient tolerated treatment well   Behavior During Therapy [facility] for tasks assessed/performed      Past Medical History:  Diagnosis Date  . Migraines     Past Surgical History:  Procedure Laterality Date  . TONSILLECTOMY    . TUBAL LIGATION      There were no vitals filed for this visit.      Subjective Assessment - 08/21/16 1534    Subjective Having some trouble today with quick head turns, leaning over   Diagnostic tests Xray and MRI - no diagnostic findings   Patient Stated Goals return to work, reduced headachs and dizziness   Currently in Pain? Yes   Pain Score 4   2/10 headache   Pain Location Shoulder   Pain Orientation Right;Left   Pain Descriptors / Indicators Tightness;Sore   Pain Type Acute pain   Pain Onset More than a month ago   Pain Frequency Intermittent                         [facility] Adult PT Treatment/Exercise - 08/21/16 0001      Neck Exercises: Machines for Strengthening   UBE (Upper Arm Bike) level 3 x 4 minutes (2/2)     Neck Exercises: Standing   Other Standing Exercises row with scap squeeze - green tband 5" hold x 15 reps     Neck Exercises: Seated   Shoulder ABduction Both;15 reps   Shoulder Abduction Limitations horizontal abduction with scap squeeze - green tband x 15 reps     Neck Exercises: Prone   Neck Retraction 10 reps;5 secs   Neck Retraction Limitations prone  on elbows - pillow at hips     Manual Therapy   Manual Therapy Soft tissue mobilization;Myofascial release;Passive ROM   Manual therapy comments patient supine   Soft tissue mobilization STM to B cervical paraspinals, B UT, B levator scap   Myofascial Release suboccipital release 2 x 1  minute   Passive ROM B cervical lateral flexion for B UT stretch 2 x 1 minute each side         Vestibular Treatment/Exercise - 08/21/16 0001      Vestibular Treatment/Exercise   Gaze Exercises X1 Viewing Horizontal;X1 Viewing Vertical  some increase in dizziness and nausea     X1 Viewing Horizontal   Foot Position standing   Comments 3 x 30" - firm surface; 3 x 30" compliant surface - busy background with both     X1 Viewing Vertical   Foot Position standing   Comments 3 x 30" - firm surface; 3 x 30" compliant surface - busy background with both                    PT Long Term Goals - 07/24/16 1715      PT LONG TERM GOAL #1  Title patient to be independent with HEP (08/31/16)   Status On-going     PT LONG TERM GOAL #2   Title Patient to demonstrate active cervical ROM with no increase in pain for improved function (08/31/16)   Status On-going     PT LONG TERM GOAL #3   Title Patient to verbalize and demonstrate proper posture/body mechanics for reduced stress placed on neck/upper back musculature (08/31/16)   Status On-going     PT LONG TERM GOAL #4   Title Patient to report ability to navigate busy environment with dizziness no greater than 2/10 (08/31/16)   Status On-going     PT LONG TERM GOAL #5   Title Patient to report dizziness no greater than 1/10 with all head movements and positional changes (08/31/16)   Status On-going     PT LONG TERM GOAL #6   Title Motion sensitivty quotient TBD   Status Achieved               Plan - 08/21/16 1535    Clinical Impression Statement PT session today focusing more on VOR with vertical and horizontal head turns with busy  background while on firm and compliant surface. Patient with some increase in dizziness and nausea during this tasks as well as body sway. Patient with conitued tightness of cerivcal musculature with some tenderness to deep pressure. Patient to continue to beneift from PT to maximize function.    PT Treatment/Interventions ADLs/Self Care Home Management;Electrical Stimulation;Moist Heat;Traction;Ultrasound;Therapeutic exercise;Therapeutic activities;Patient/family education;Manual techniques;Passive range of motion;Vestibular;Visual/perceptual remediation/compensation;Taping   PT Next Visit Plan progress VOR as tolerated, manual therapy for improve tissue quality, modalities prn as needed   Consulted and Agree with Plan of Care Patient      Patient will benefit from skilled therapeutic intervention in order to improve the following deficits and impairments:  Decreased activity tolerance, Decreased balance, Decreased range of motion, Dizziness, Pain  Visit Diagnosis: Dizziness and giddiness  Cervicalgia  Abnormal posture     Problem List Patient Active Problem List   Diagnosis Date Noted  . Post concussion syndrome 05/17/2016  . Vitamin D deficiency 12/20/2013    Lanney Gins, PT, DPT 08/21/16 5:53 PM   [facility] [address]  [place], [place], [address] Phone: [phone]   Fax:  [phone]  Name: Jennifer Manning MRN: [id number] Date of Birth: 1971/04/24

## 2016-08-24 ENCOUNTER — Ambulatory Visit: Payer: BC Managed Care – PPO | Attending: Neurology | Admitting: Physical Therapy

## 2016-08-24 DIAGNOSIS — M542 Cervicalgia: Secondary | ICD-10-CM | POA: Diagnosis present

## 2016-08-24 DIAGNOSIS — R42 Dizziness and giddiness: Secondary | ICD-10-CM | POA: Diagnosis not present

## 2016-08-24 DIAGNOSIS — R293 Abnormal posture: Secondary | ICD-10-CM | POA: Insufficient documentation

## 2016-08-24 NOTE — Therapy (Signed)
[facility] [address]  [place], [place], [address] Phone: [phone]   Fax:  [phone]  Physical Therapy Treatment  Patient Details  Name: Jennifer Manning MRN: [id number] Date of Birth: 16-Nov-1970 Referring Provider: Dr. Melvenia Beam  Encounter Date: 08/24/2016      PT End of Session - 08/24/16 0935    Visit Number 9   Number of Visits 12   Date for PT Re-Evaluation 08/31/16   PT Start Time 0934   PT Stop Time 1036   PT Time Calculation (min) 62 min   Activity Tolerance Patient tolerated treatment well   Behavior During Therapy [facility] for tasks assessed/performed      Past Medical History:  Diagnosis Date  . Migraines     Past Surgical History:  Procedure Laterality Date  . TONSILLECTOMY    . TUBAL LIGATION      There were no vitals filed for this visit.      Subjective Assessment - 08/24/16 0936    Subjective continues to have some tightness - N&V returning   Diagnostic tests Xray and MRI - no diagnostic findings   Patient Stated Goals return to work, reduced headachs and dizziness   Currently in Pain? Yes   Pain Score 3    Pain Location Neck   Pain Orientation Right;Left   Pain Descriptors / Indicators Tightness;Aching;Sore   Pain Type Acute pain   Pain Onset More than a month ago   Pain Frequency Intermittent                         [facility] Adult PT Treatment/Exercise - 08/24/16 0941      Neck Exercises: Machines for Strengthening   UBE (Upper Arm Bike) level 3 x 4 minutes (2/2)     Modalities   Modalities Electrical Stimulation;Moist Heat     Moist Heat Therapy   Number Minutes Moist Heat 15 Minutes   Moist Heat Location Cervical     Electrical Stimulation   Electrical Stimulation Location B cervical paraspinals to B UT   Electrical Stimulation Action IFC   Electrical Stimulation Parameters to tolerance   Electrical Stimulation Goals Pain;Tone     Manual  Therapy   Manual Therapy Joint mobilization;Soft tissue mobilization;Myofascial release;Passive ROM   Manual therapy comments patient supine   Joint Mobilization Grade I-II CPA from approx C3-T1; good joint movement, reduced pain   Soft tissue mobilization STM to B cervical paraspinals, B UT, B levator scap, and B suboccipital mm   Myofascial Release manual triggerpoint release - B UT - R>L   Passive ROM B cervical lateral flexion for B UT stretch 1 x 1 minute each side         Vestibular Treatment/Exercise - 08/24/16 0001      Vestibular Treatment/Exercise   Gaze Exercises X1 Viewing Horizontal;X1 Viewing Vertical     X1 Viewing Horizontal   Foot Position standing   Comments 3 x 30" - firm surface - narrow BOS; 3 x 30" compliant surface - narrow BOS - busy background with both with busy background - increase nausea after this tasks - VOR training terminated     X1 Viewing Vertical   Foot Position standing   Comments 3 x 30" - firm surface - narrow BOS;  busy background                    PT Long Term Goals -  07/24/16 1715      PT LONG TERM GOAL #1   Title patient to be independent with HEP (08/31/16)   Status On-going     PT LONG TERM GOAL #2   Title Patient to demonstrate active cervical ROM with no increase in pain for improved function (08/31/16)   Status On-going     PT LONG TERM GOAL #3   Title Patient to verbalize and demonstrate proper posture/body mechanics for reduced stress placed on neck/upper back musculature (08/31/16)   Status On-going     PT LONG TERM GOAL #4   Title Patient to report ability to navigate busy environment with dizziness no greater than 2/10 (08/31/16)   Status On-going     PT LONG TERM GOAL #5   Title Patient to report dizziness no greater than 1/10 with all head movements and positional changes (08/31/16)   Status On-going     PT LONG TERM GOAL #6   Title Motion sensitivty quotient TBD   Status Achieved               Plan  - 08/24/16 0936    Clinical Impression Statement Patient doing well today. VOR training continued today with patient having an increase in nausea symptoms with horizontal head turns on compliant surface requiring termination of task. Patient continues to have some noted tightness throughout B UT, B levator scap, as well as cervical paraspinals with patient responding well to manual therapy and estim/moist heat with pain and symptom reduction at end of treatment. Discussion between patinet and PT today regarding possiblity of returning to work 3 full days starting on approx 09/03/16 (will speak with MD prior to committing to this) as patient feels she needs to begin reintegrating herself into full work days. Patient to continue to benefit from PT to maximize function.    PT Treatment/Interventions ADLs/Self Care Home Management;Electrical Stimulation;Moist Heat;Traction;Ultrasound;Therapeutic exercise;Therapeutic activities;Patient/family education;Manual techniques;Passive range of motion;Vestibular;Visual/perceptual remediation/compensation;Taping   PT Next Visit Plan progress VOR as tolerated, manual therapy for improve tissue quality, modalities prn as needed   Consulted and Agree with Plan of Care Patient      Patient will benefit from skilled therapeutic intervention in order to improve the following deficits and impairments:  Decreased activity tolerance, Decreased balance, Decreased range of motion, Dizziness, Pain  Visit Diagnosis: Dizziness and giddiness  Cervicalgia  Abnormal posture     Problem List Patient Active Problem List   Diagnosis Date Noted  . Post concussion syndrome 05/17/2016  . Vitamin D deficiency 12/20/2013    Lanney Gins, PT, DPT 08/24/16 10:58 AM   [facility] [address]  [place], [place], [address] Phone: [phone]   Fax:  [phone]  Name: Jennifer Manning MRN: [id number] Date  of Birth: 02-09-1971

## 2016-08-27 ENCOUNTER — Telehealth: Payer: Self-pay | Admitting: Neurology

## 2016-08-27 NOTE — Telephone Encounter (Signed)
Returned pt TC to verify what should be included in work [employer]. PT note mentions starting work next Mon (09/03/16) for 3 full days per week. Asked that pt return call to discuss further.

## 2016-08-27 NOTE — Telephone Encounter (Signed)
Pt called back and would like to try working 3 full days per week starting next Monday for at least 3 wks before going to full time. She will continue half days the rest of this week and will attend her last PT session on Friday. Will complete letter and place up front for pt to pick up tomorrow during regular office hours.

## 2016-08-27 NOTE — Telephone Encounter (Signed)
PT notes from 08/24/16 visit state... Patient doing well today. VOR training continued today with patient having an increase in nausea symptoms with horizontal head turns on compliant surface requiring termination of task. Patient continues to have some noted tightness throughout B UT, B levator scap, as well as cervical paraspinals with patient responding well to manual therapy and estim/moist heat with pain and symptom reduction at end of treatment. Discussion between patinet and PT today regarding possiblity of returning to work 3 full days starting on approx 09/03/16 (will speak with MD prior to committing to this) as patient feels she needs to begin reintegrating herself into full work days. Patient to continue to benefit from PT to maximize function.

## 2016-08-27 NOTE — Telephone Encounter (Signed)
That's fine thank you

## 2016-08-27 NOTE — Telephone Encounter (Signed)
Pt reports that she is doing much better and is ready to try to work full days. However, she just wanted to let Dr. Jaynee Eagles know that she still experiences occasional HA and is having nausea almost daily but this is usually relieved w/ use of Zofran. She also continues to have slight difficulty w/ vision esp if looking at something that is moving or w/ light such as when scrolling on a computer.

## 2016-08-27 NOTE — Telephone Encounter (Signed)
Pt called said she was advised per PT she needs extension letter for work. The PT orders will expire this Friday and they will send a letter to Dr A with the recommendations. Says she is not sure if letter should be for 1 or 2 week extension. She would like to pick up the letter to take to HR. Please call

## 2016-08-28 ENCOUNTER — Ambulatory Visit: Payer: BC Managed Care – PPO | Admitting: Physical Therapy

## 2016-08-28 DIAGNOSIS — R293 Abnormal posture: Secondary | ICD-10-CM

## 2016-08-28 DIAGNOSIS — R42 Dizziness and giddiness: Secondary | ICD-10-CM | POA: Diagnosis not present

## 2016-08-28 DIAGNOSIS — M542 Cervicalgia: Secondary | ICD-10-CM

## 2016-08-28 NOTE — Therapy (Signed)
[facility] [address]  [place], [place], [address] Phone: [phone]   Fax:  [phone]  Physical Therapy Treatment  Patient Details  Name: TYNECIA CORTIJO MRN: [id number] Date of Birth: 08-30-1970 Referring Provider: Dr. Melvenia Beam  Encounter Date: 08/28/2016      PT End of Session - 08/28/16 1754    Visit Number 10   Number of Visits 12   Date for PT Re-Evaluation 08/31/16   PT Start Time [id number]   PT Stop Time 1751   PT Time Calculation (min) 46 min   Activity Tolerance Patient tolerated treatment well   Behavior During Therapy [facility] for tasks assessed/performed      Past Medical History:  Diagnosis Date  . Migraines     Past Surgical History:  Procedure Laterality Date  . TONSILLECTOMY    . TUBAL LIGATION      There were no vitals filed for this visit.      Subjective Assessment - 08/28/16 1707    Subjective continues to have good compliance with HEP - same neck pain lingering   Diagnostic tests Xray and MRI - no diagnostic findings   Patient Stated Goals return to work, reduced headachs and dizziness   Currently in Pain? Yes   Pain Score 3   0/10 headache pain; 4/10 HA earlier today   Pain Location Neck   Pain Orientation Right;Left   Pain Descriptors / Indicators Sore;Constant   Pain Type Acute pain   Pain Onset More than a month ago   Pain Frequency Intermittent                         [facility] Adult PT Treatment/Exercise - 08/28/16 0001      Neck Exercises: Machines for Strengthening   UBE (Upper Arm Bike) level 3 x 4 minutes (2/2)     Neck Exercises: Standing   Other Standing Exercises row with scap squeeze - green tband 5" hold x 15 reps   Other Standing Exercises shoulder extension with scap squeeze - green tband x 15 reps with 5" hold     Neck Exercises: Seated   Other Seated Exercise ER with scap squeeze - green tband x 15 reps - elbows by side     Manual  Therapy   Manual Therapy Soft tissue mobilization;Myofascial release;Passive ROM   Manual therapy comments patient supine   Soft tissue mobilization STM to B cervical paraspinals, B UT, B levator scap, and B suboccipital mm   Myofascial Release suboccipital release 3 x 1 minute - pain reproduction and resolve   Passive ROM B cervical lateral flexion for B UT stretch 1 x 1 minute each side         Vestibular Treatment/Exercise - 08/28/16 0001      Vestibular Treatment/Exercise   Gaze Exercises X2 Viewing Horizontal;X2 Viewing Vertical;Eye/Head Exercise Horizontal     X1 Viewing Horizontal   Foot Position standing   Comments 3x30" - firm surface; 3x30" compliant surface - both with narrow BOS     X1 Viewing Vertical   Foot Position standing   Comments 3x30" firm surface; narow BOS     Eye/Head Exercise Horizontal   Foot Position feet together - snellen eye chart - difficult maintainin eyes open line 1-10                    PT Long Term Goals - 07/24/16 1715  PT LONG TERM GOAL #1   Title patient to be independent with HEP (08/31/16)   Status On-going     PT LONG TERM GOAL #2   Title Patient to demonstrate active cervical ROM with no increase in pain for improved function (08/31/16)   Status On-going     PT LONG TERM GOAL #3   Title Patient to verbalize and demonstrate proper posture/body mechanics for reduced stress placed on neck/upper back musculature (08/31/16)   Status On-going     PT LONG TERM GOAL #4   Title Patient to report ability to navigate busy environment with dizziness no greater than 2/10 (08/31/16)   Status On-going     PT LONG TERM GOAL #5   Title Patient to report dizziness no greater than 1/10 with all head movements and positional changes (08/31/16)   Status On-going     PT LONG TERM GOAL #6   Title Motion sensitivty quotient TBD   Status Achieved               Plan - 08/28/16 1754    Clinical Impression Statement Ariatna doing well  today - reports of "deep tissue massage" at chiro office earlier this week - of which made her nauseous, however with improved tissue quailty of B UT/levator scap area. Patient continuing to work on VOR tasks today with some increae nausea with vertical head turns, thus terminating task prior to compliant surface training. Patient to conitnue to benefit from PT to maximize function.    PT Treatment/Interventions ADLs/Self Care Home Management;Electrical Stimulation;Moist Heat;Traction;Ultrasound;Therapeutic exercise;Therapeutic activities;Patient/family education;Manual techniques;Passive range of motion;Vestibular;Visual/perceptual remediation/compensation;Taping   PT Next Visit Plan progress VOR as tolerated, manual therapy for improve tissue quality, modalities prn as needed   Consulted and Agree with Plan of Care Patient      Patient will benefit from skilled therapeutic intervention in order to improve the following deficits and impairments:  Decreased activity tolerance, Decreased balance, Decreased range of motion, Dizziness, Pain  Visit Diagnosis: Dizziness and giddiness  Cervicalgia  Abnormal posture     Problem List Patient Active Problem List   Diagnosis Date Noted  . Post concussion syndrome 05/17/2016  . Vitamin D deficiency 12/20/2013    Lanney Gins, PT, DPT 08/28/16 6:15 PM   [facility] [address]  [place], [place], [address] Phone: [phone]   Fax:  [phone]  Name: PAULINE RITCHIE MRN: [id number] Date of Birth: 11-29-1970

## 2016-08-31 ENCOUNTER — Ambulatory Visit: Payer: BC Managed Care – PPO | Admitting: Physical Therapy

## 2016-08-31 DIAGNOSIS — R42 Dizziness and giddiness: Secondary | ICD-10-CM | POA: Diagnosis not present

## 2016-08-31 DIAGNOSIS — R293 Abnormal posture: Secondary | ICD-10-CM

## 2016-08-31 DIAGNOSIS — M542 Cervicalgia: Secondary | ICD-10-CM

## 2016-08-31 NOTE — Therapy (Signed)
[facility] [address]  [place], [place], [address] Phone: [phone]   Fax:  [phone]  Physical Therapy Treatment  Patient Details  Name: Jennifer Manning MRN: [id number] Date of Birth: 06-18-1971 Referring Provider: Dr. Melvenia Beam  Encounter Date: 08/31/2016      PT End of Session - 08/31/16 0941    Visit Number 11   Number of Visits 20   Date for PT Re-Evaluation 09/28/16   PT Start Time 0935   PT Stop Time 1015   PT Time Calculation (min) 40 min   Activity Tolerance Patient tolerated treatment well   Behavior During Therapy [facility] for tasks assessed/performed      Past Medical History:  Diagnosis Date  . Migraines     Past Surgical History:  Procedure Laterality Date  . TONSILLECTOMY    . TUBAL LIGATION      There were no vitals filed for this visit.      Subjective Assessment - 08/31/16 0940    Subjective feeling well today - no headache. Feels "at least 50% better"   Diagnostic tests Xray and MRI - no diagnostic findings   Patient Stated Goals return to work, reduced headachs and dizziness   Currently in Pain? Yes   Pain Score 1    Pain Location Neck   Pain Orientation Right;Left   Pain Descriptors / Indicators Sore   Pain Type Acute pain   Pain Onset More than a month ago   Pain Frequency Intermittent                         [facility] Adult PT Treatment/Exercise - 08/31/16 0001      Neck Exercises: Machines for Strengthening   UBE (Upper Arm Bike) level 3 x 6 minutes (3/3)     Modalities   Modalities Moist Heat     Moist Heat Therapy   Number Minutes Moist Heat 15 Minutes   Moist Heat Location Lumbar Spine     Manual Therapy   Manual Therapy Soft tissue mobilization;Myofascial release;Passive ROM   Manual therapy comments patient supine   Soft tissue mobilization STM to B cervical paraspinals, B UT, B levator scap, and B suboccipital mm   Myofascial Release  suboccipital release 2 x 1 minute    Passive ROM B cervical lateral flexion for B UT stretch 1 x 1 minute each side             Balance Exercises - 08/31/16 1126      Balance Exercises: Standing   Standing Eyes Opened Narrow base of support (BOS);Foam/compliant surface;Head turns  60 seconds x 2 each - vertical and horizontal   Tandem Stance Eyes open;Foam/compliant surface  60 seconds each - R and L foot in front   SLS Eyes open;Foam/compliant surface  B LE - 30 seconds x 2 each   Tandem Gait Forward;Retro;Foam/compliant surface   Sidestepping Foam/compliant support                PT Long Term Goals - 08/31/16 1128      PT LONG TERM GOAL #1   Title patient to be independent with HEP (08/31/16)   Status Achieved     PT LONG TERM GOAL #2   Title Patient to demonstrate active cervical ROM with no increase in pain for improved function (08/31/16)   Status Achieved  only residual muscle soreness      PT  LONG TERM GOAL #3   Title Patient to verbalize and demonstrate proper posture/body mechanics for reduced stress placed on neck/upper back musculature (08/31/16)   Status On-going     PT LONG TERM GOAL #4   Title Patient to report ability to navigate busy environment with dizziness no greater than 2/10 (08/31/16)   Status On-going     PT LONG TERM GOAL #5   Title Patient to report dizziness no greater than 1/10 with all head movements and positional changes (08/31/16)   Status On-going               Plan - 08/31/16 1129    Clinical Impression Statement Rivka doing well today - PT session today focusing more on balance tasks rather than VOR with patient to perform all balance activities (narrow BOS, tandem stance, single limb stance) on compliant surfaces with head movements with very little dizziness with fair to good balance. Patient tissue quality improving with mild palpable tissue tightness noted throughout B UT, B cervical paraspinals, and B suboccipital mm. Some  reports of nausea with deep palpation and manual therapy. Patient with subjective reports of feeling 50% better since start of treatment with ability to engage herself more in conversation with head turns as well as feeling more comfortable with driving. Patient to continue to benefit from PT to maximize function and ovall quality of life.    Rehab Potential Good   PT Frequency 2x / week   PT Duration 4 weeks   PT Treatment/Interventions ADLs/Self Care Home Management;Electrical Stimulation;Moist Heat;Traction;Ultrasound;Therapeutic exercise;Therapeutic activities;Patient/family education;Manual techniques;Passive range of motion;Vestibular;Visual/perceptual remediation/compensation;Taping   PT Next Visit Plan progress VOR as tolerated, manual therapy for improve tissue quality, modalities prn as needed   Consulted and Agree with Plan of Care Patient      Patient will benefit from skilled therapeutic intervention in order to improve the following deficits and impairments:  Decreased activity tolerance, Decreased balance, Decreased range of motion, Dizziness, Pain  Visit Diagnosis: Dizziness and giddiness - Plan: PT plan of care cert/re-cert  Cervicalgia - Plan: PT plan of care cert/re-cert  Abnormal posture - Plan: PT plan of care cert/re-cert     Problem List Patient Active Problem List   Diagnosis Date Noted  . Post concussion syndrome 05/17/2016  . Vitamin D deficiency 12/20/2013    Lanney Gins, PT, DPT 08/31/16 11:36 AM  [facility] [address]  [place], [place], [address] Phone: [phone]   Fax:  [phone]  Name: Jennifer Manning MRN: [id number] Date of Birth: Apr 29, 1971

## 2016-09-03 ENCOUNTER — Ambulatory Visit: Payer: BC Managed Care – PPO | Admitting: Physical Therapy

## 2016-09-03 DIAGNOSIS — R42 Dizziness and giddiness: Secondary | ICD-10-CM

## 2016-09-03 DIAGNOSIS — M542 Cervicalgia: Secondary | ICD-10-CM

## 2016-09-03 DIAGNOSIS — R293 Abnormal posture: Secondary | ICD-10-CM

## 2016-09-03 NOTE — Therapy (Signed)
[facility] [address]  [place], [place], [address] Phone: [phone]   Fax:  [phone]  Physical Therapy Treatment  Patient Details  Name: Jennifer Manning MRN: [id number] Date of Birth: Apr 28, 1971 Referring Provider: Dr. Melvenia Beam  Encounter Date: 09/03/2016      PT End of Session - 09/03/16 0842    Visit Number 12   Number of Visits 20   Date for PT Re-Evaluation 09/28/16   PT Start Time 0838   PT Stop Time 0940   PT Time Calculation (min) 62 min   Activity Tolerance Patient tolerated treatment well   Behavior During Therapy [facility] for tasks assessed/performed      Past Medical History:  Diagnosis Date  . Migraines     Past Surgical History:  Procedure Laterality Date  . TONSILLECTOMY    . TUBAL LIGATION      There were no vitals filed for this visit.      Subjective Assessment - 09/03/16 0840    Subjective tried vertical head turns first - increased nausea   Diagnostic tests Xray and MRI - no diagnostic findings   Patient Stated Goals return to work, reduced headachs and dizziness   Currently in Pain? Yes   Pain Score 1    Pain Location Neck   Pain Orientation Right;Left   Pain Descriptors / Indicators Sore;Discomfort   Pain Type Acute pain   Pain Onset More than a month ago   Pain Frequency Intermittent                         [facility] Adult PT Treatment/Exercise - 09/03/16 0001      Neck Exercises: Machines for Strengthening   UBE (Upper Arm Bike) level 3 x 6 minutes (3/3)     Neck Exercises: Standing   Other Standing Exercises B UE - row with scap squeeze - green tband x 15 reps   Other Standing Exercises shoulder extension with scap squeeze - green tband x 15 reps with 5" hold     Neck Exercises: Seated   Shoulder ABduction Both;15 reps   Shoulder Abduction Limitations horizontal abduction with scap squeeze - green tband x 15   Other Seated Exercise ER with  scap squeeze - green tband x 15 reps - elbows by side     Modalities   Modalities Moist Heat     Moist Heat Therapy   Number Minutes Moist Heat 15 Minutes   Moist Heat Location Cervical;Lumbar Spine     Electrical Stimulation   Electrical Stimulation Location B cervical paraspinals to B UT   Electrical Stimulation Action IFC    Electrical Stimulation Parameters to tolerance   Electrical Stimulation Goals Pain;Tone     Manual Therapy   Manual Therapy Joint mobilization;Soft tissue mobilization;Myofascial release   Manual therapy comments patient supine   Joint Mobilization gentle grade I-II CPA from approx C4-T1 - good joint mobility, however with slight localized pain   Soft tissue mobilization STM to B cervical paraspinals, B UT, B levator scap, and B suboccipital mm   Myofascial Release suboccipital release 3 x 1 minute          Vestibular Treatment/Exercise - 09/03/16 0001      Vestibular Treatment/Exercise   Gaze Exercises X1 Viewing Horizontal;X1 Viewing Vertical     X1 Viewing Horizontal   Foot Position tandem   Time --  45 seconds   Reps 2  Comments 2 reps with each foot in front - firm surface     X1 Viewing Vertical   Foot Position narrow BOS   Time --  45 seconds   Reps 3   Comments firm surface     Eye/Head Exercise Horizontal   Foot Position feet together - same head/eye movements - slow speed; horizontal 2 x 30 sec, vertical 2 x 30 sec                    PT Long Term Goals - 08/31/16 1128      PT LONG TERM GOAL #1   Title patient to be independent with HEP (08/31/16)   Status Achieved     PT LONG TERM GOAL #2   Title Patient to demonstrate active cervical ROM with no increase in pain for improved function (08/31/16)   Status Achieved  only residual muscle soreness      PT LONG TERM GOAL #3   Title Patient to verbalize and demonstrate proper posture/body mechanics for reduced stress placed on neck/upper back musculature (08/31/16)    Status On-going     PT LONG TERM GOAL #4   Title Patient to report ability to navigate busy environment with dizziness no greater than 2/10 (08/31/16)   Status On-going     PT LONG TERM GOAL #5   Title Patient to report dizziness no greater than 1/10 with all head movements and positional changes (08/31/16)   Status On-going               Plan - 09/03/16 [id number]    Clinical Impression Statement Patient doing well today - very minimal head and neck pain at initiation of session, however slight headache with VOR exercises and an increase in nausea with head turns. VOR treatment including x1 viewing both horizontal and vertical head turns as well as same direction head/eye turns with a much more pronounced increase in nausea and "swimmy" feeling after completion of task requiring task to be terminated with vertical head turns. Patient progressing periscapular strength and posture with ability to maintain and self-correct to neutral positioning.    PT Treatment/Interventions ADLs/Self Care Home Management;Electrical Stimulation;Moist Heat;Traction;Ultrasound;Therapeutic exercise;Therapeutic activities;Patient/family education;Manual techniques;Passive range of motion;Vestibular;Visual/perceptual remediation/compensation;Taping   PT Next Visit Plan progress VOR as tolerated, manual therapy for improve tissue quality, modalities prn as needed   Consulted and Agree with Plan of Care Patient      Patient will benefit from skilled therapeutic intervention in order to improve the following deficits and impairments:  Decreased activity tolerance, Decreased balance, Decreased range of motion, Dizziness, Pain  Visit Diagnosis: Dizziness and giddiness  Cervicalgia  Abnormal posture     Problem List Patient Active Problem List   Diagnosis Date Noted  . Post concussion syndrome 05/17/2016  . Vitamin D deficiency 12/20/2013    Lanney Gins, PT, DPT 09/03/16 12:01 PM   [facility] [address]  [address] [place], [place], [address] Phone: [phone]   Fax:  [phone]  Name: Jennifer Manning MRN: [id number] Date of Birth: Oct 01, 1970

## 2016-09-06 ENCOUNTER — Ambulatory Visit: Payer: BC Managed Care – PPO | Admitting: Physical Therapy

## 2016-09-10 ENCOUNTER — Ambulatory Visit: Payer: BC Managed Care – PPO | Admitting: Physical Therapy

## 2016-09-10 DIAGNOSIS — R42 Dizziness and giddiness: Secondary | ICD-10-CM

## 2016-09-10 DIAGNOSIS — R293 Abnormal posture: Secondary | ICD-10-CM

## 2016-09-10 DIAGNOSIS — M542 Cervicalgia: Secondary | ICD-10-CM

## 2016-09-10 NOTE — Therapy (Signed)
[facility] [address]  [place], [place], [address] Phone: [phone]   Fax:  [phone]  Physical Therapy Treatment  Patient Details  Name: Jennifer Manning MRN: [id number] Date of Birth: 02/22/1971 Referring Provider: Dr. Melvenia Beam  Encounter Date: 09/10/2016      PT End of Session - 09/10/16 0811    Visit Number 13   Number of Visits 20   Date for PT Re-Evaluation 09/28/16   PT Start Time 0807  pt late   PT Stop Time 0908   PT Time Calculation (min) 61 min   Activity Tolerance Patient tolerated treatment well   Behavior During Therapy [facility] for tasks assessed/performed      Past Medical History:  Diagnosis Date  . Migraines     Past Surgical History:  Procedure Laterality Date  . TONSILLECTOMY    . TUBAL LIGATION      There were no vitals filed for this visit.      Subjective Assessment - 09/10/16 0809    Subjective went back to 3 full days last week - had headache on Wednesday and through to weekend. Some increased dizziness/nausea with looking at phone and tv.   Limitations Reading;House hold activities   Diagnostic tests Xray and MRI - no diagnostic findings   Patient Stated Goals return to work, reduced headachs and dizziness   Currently in Pain? Yes   Pain Score 2   2/10 headache   Pain Location Neck   Pain Orientation Right;Left;Posterior   Pain Descriptors / Indicators Sore;Discomfort   Pain Type Acute pain   Pain Onset More than a month ago   Pain Frequency Intermittent                         [facility] Adult PT Treatment/Exercise - 09/10/16 0812      Neck Exercises: Machines for Strengthening   UBE (Upper Arm Bike) level 3 x 6 minutes (3/3)     Modalities   Modalities Moist Heat     Moist Heat Therapy   Number Minutes Moist Heat 15 Minutes   Moist Heat Location Cervical;Lumbar Spine     Electrical Stimulation   Electrical Stimulation Location B  cervical paraspinals to B UT   Electrical Stimulation Action IFC   Electrical Stimulation Parameters to tolerance   Electrical Stimulation Goals Pain;Tone     Manual Therapy   Manual Therapy Joint mobilization;Soft tissue mobilization;Myofascial release;Passive ROM;Manual Traction   Manual therapy comments patient supine   Joint Mobilization gentle grade I-II CPA from approx C4-T1 - some pain with this - patient with tendency to move jaw in response to pain   Soft tissue mobilization STM to B cervical paraspinals, B UT, B levator scap, and B suboccipital mm - some nausea ellicited with deep STM and palpation   Myofascial Release suboccipital release 2 x 1 minute    Passive ROM cervical sidebending 3 x each side with time allotted for return to baseline symptoms; rotation 3 x each side with time to return to baseline symptoms.    Manual Traction 3 x 30 seconds - no pain or symptoms provocation (nausea)                     PT Long Term Goals - 08/31/16 1128      PT LONG TERM GOAL #1   Title patient to be independent with HEP (08/31/16)   Status  Achieved     PT LONG TERM GOAL #2   Title Patient to demonstrate active cervical ROM with no increase in pain for improved function (08/31/16)   Status Achieved  only residual muscle soreness      PT LONG TERM GOAL #3   Title Patient to verbalize and demonstrate proper posture/body mechanics for reduced stress placed on neck/upper back musculature (08/31/16)   Status On-going     PT LONG TERM GOAL #4   Title Patient to report ability to navigate busy environment with dizziness no greater than 2/10 (08/31/16)   Status On-going     PT LONG TERM GOAL #5   Title Patient to report dizziness no greater than 1/10 with all head movements and positional changes (08/31/16)   Status On-going               Plan - 09/10/16 [id number]    Clinical Impression Statement PT session today focusing on manual therapy to reduce head/neck pain as well as  for tolerance to different positions as patient today reported increased nausea symptoms with manual therapy as well as with head turns and sidebending while in supine, however with no reports of spinning or dizziness and no abnormal eye movements. Patient and PT discussion today regarding patient follwoing up with MD to assess current and ongoing symptoms.    PT Treatment/Interventions ADLs/Self Care Home Management;Electrical Stimulation;Moist Heat;Traction;Ultrasound;Therapeutic exercise;Therapeutic activities;Patient/family education;Manual techniques;Passive range of motion;Vestibular;Visual/perceptual remediation/compensation;Taping   PT Next Visit Plan progress VOR as tolerated, manual therapy for improve tissue quality, modalities prn as needed   Consulted and Agree with Plan of Care Patient      Patient will benefit from skilled therapeutic intervention in order to improve the following deficits and impairments:  Decreased activity tolerance, Decreased balance, Decreased range of motion, Dizziness, Pain  Visit Diagnosis: Dizziness and giddiness  Cervicalgia  Abnormal posture     Problem List Patient Active Problem List   Diagnosis Date Noted  . Post concussion syndrome 05/17/2016  . Vitamin D deficiency 12/20/2013     Lanney Gins, PT, DPT 09/10/16 3:34 PM   [facility] [address]  [place], [place], [address] Phone: [phone]   Fax:  [phone]  Name: Jennifer Manning MRN: [id number] Date of Birth: [birth year]

## 2016-09-13 ENCOUNTER — Telehealth: Payer: Self-pay | Admitting: Neurology

## 2016-09-13 ENCOUNTER — Ambulatory Visit: Payer: BC Managed Care – PPO | Admitting: Physical Therapy

## 2016-09-13 DIAGNOSIS — R293 Abnormal posture: Secondary | ICD-10-CM

## 2016-09-13 DIAGNOSIS — R42 Dizziness and giddiness: Secondary | ICD-10-CM | POA: Diagnosis not present

## 2016-09-13 DIAGNOSIS — M542 Cervicalgia: Secondary | ICD-10-CM

## 2016-09-13 NOTE — Therapy (Signed)
[facility] [address]  [place], [place], [address] Phone: [phone]   Fax:  [phone]  Physical Therapy Treatment  Patient Details  Name: Jennifer Manning MRN: [id number] Date of Birth: [birth year] Referring Provider: Dr. Melvenia Beam  Encounter Date: 09/13/2016      PT End of Session - 09/13/16 1735    Visit Number 14   Number of Visits 20   Date for PT Re-Evaluation 09/28/16   PT Start Time [id number]   PT Stop Time 1656   PT Time Calculation (min) 41 min   Activity Tolerance Patient tolerated treatment well   Behavior During Therapy [facility] for tasks assessed/performed      Past Medical History:  Diagnosis Date  . Migraines     Past Surgical History:  Procedure Laterality Date  . TONSILLECTOMY    . TUBAL LIGATION      There were no vitals filed for this visit.      Subjective Assessment - 09/13/16 1727    Subjective cantinued increase in nausea - waiting to hear from MD regarding follow-up appt.    Diagnostic tests Xray and MRI - no diagnostic findings   Patient Stated Goals return to work, reduced headachs and dizziness   Currently in Pain? Yes   Pain Score 2    Pain Location Head   Pain Descriptors / Indicators Headache   Pain Type Acute pain                Vestibular Assessment - 09/13/16 0001      Positional Testing   Dix-Hallpike Dix-Hallpike Right;Dix-Hallpike Left   Sidelying Test Sidelying Right;Sidelying Left     Dix-Hallpike Right   Dix-Hallpike Right Symptoms No nystagmus     Dix-Hallpike Left   Dix-Hallpike Left Symptoms No nystagmus     Sidelying Right   Sidelying Right Symptoms No nystagmus     Sidelying Left   Sidelying Left Symptoms No nystagmus                 [facility] Adult PT Treatment/Exercise - 09/13/16 0001      Neck Exercises: Stretches   Corner Stretch 3 reps;30 seconds  low/mid/high in doorway         Vestibular Treatment/Exercise -  09/13/16 0001      Vestibular Treatment/Exercise   Vestibular Treatment Provided Canalith Repositioning   Canalith Repositioning Epley Manuever Right   Habituation Exercises [employer]   Gaze Exercises X1 Viewing Horizontal;X1 Viewing Vertical      EPLEY MANUEVER RIGHT   Number of Reps  1   Overall Response Improved Symptoms   Response Details  slight improvement in symptoms - no real nystagmus, however increased symptoms towards right, therefore taken through [employer]   Number of Reps  5   Symptom Description  continued "swimminess" - improvement in symtpoms <30 in each position     X1 Viewing Horizontal   Foot Position seated   Reps 2   Comments 30-45 sec     X1 Viewing Vertical   Foot Position seated   Reps 2   Comments 30-45 seconds     Eye/Head Exercise Horizontal   Foot Position seated - same head and eye movements 2 x 30 sec     Eye/Head Exercise Vertical   Foot Position seated - same head and eye movements 2 x 30 sec  PT Long Term Goals - 08/31/16 1128      PT LONG TERM GOAL #1   Title patient to be independent with HEP (08/31/16)   Status Achieved     PT LONG TERM GOAL #2   Title Patient to demonstrate active cervical ROM with no increase in pain for improved function (08/31/16)   Status Achieved  only residual muscle soreness      PT LONG TERM GOAL #3   Title Patient to verbalize and demonstrate proper posture/body mechanics for reduced stress placed on neck/upper back musculature (08/31/16)   Status On-going     PT LONG TERM GOAL #4   Title Patient to report ability to navigate busy environment with dizziness no greater than 2/10 (08/31/16)   Status On-going     PT LONG TERM GOAL #5   Title Patient to report dizziness no greater than 1/10 with all head movements and positional changes (08/31/16)   Status On-going               Plan - 09/13/16 1735    Clinical Impression Statement Jennifer Manning today with  sontinued subjective reports of overall increased headache and nausea symptoms - has called to schedule appt. with MD and awaiting phone call. Patient reporting dizziness today while turning head in shower, therefore patient taken through Dix-Hallpike and side-lying test with patient negative for nystagmus in all positions, however taken through canalith repositioning manuever on R due to an increase in symptoms as compared to the left. Patient today given Brandt-Daroff exercises for habituation and improved tolerance. Patient to notify PT of follow-up and MD plan.   PT Treatment/Interventions ADLs/Self Care Home Management;Electrical Stimulation;Moist Heat;Traction;Ultrasound;Therapeutic exercise;Therapeutic activities;Patient/family education;Manual techniques;Passive range of motion;Vestibular;Visual/perceptual remediation/compensation;Taping   Consulted and Agree with Plan of Care Patient      Patient will benefit from skilled therapeutic intervention in order to improve the following deficits and impairments:  Decreased activity tolerance, Decreased balance, Decreased range of motion, Dizziness, Pain  Visit Diagnosis: Dizziness and giddiness  Cervicalgia  Abnormal posture     Problem List Patient Active Problem List   Diagnosis Date Noted  . Post concussion syndrome 05/17/2016  . Vitamin D deficiency 12/20/2013    Lanney Gins, PT, DPT 09/13/16 5:42 PM   [facility] [address]  [place], [place], [address] Phone: [phone]   Fax:  [phone]  Name: Jennifer Manning MRN: [id number] Date of Birth: 1970-10-17

## 2016-09-13 NOTE — Telephone Encounter (Signed)
Patient called office in reference to dizziness, vomiting, headaches, difficulty with any movement causing vomiting has returned back to work 3 days a week full days, but not doing well.  Patient is wanting to know if she is needing to come back in to be seen.  Please call

## 2016-09-14 NOTE — Telephone Encounter (Signed)
Have her see an NP next week. If these are acute and worsening she needs to go to the ED

## 2016-09-14 NOTE — Telephone Encounter (Signed)
Spoke with pt.  She stated that since increase of her work schedule she has noted increased headache, dizziness, nausea, some vomiting at times.  TV/computer make things worse.  Appt made Monday 09-17-16 at 1515 to evaluate.  Pt made aware if worsens to seek care with ED or urgent care.  She verbalized understanding.

## 2016-09-17 ENCOUNTER — Encounter: Payer: Self-pay | Admitting: Nurse Practitioner

## 2016-09-17 ENCOUNTER — Telehealth: Payer: Self-pay | Admitting: *Deleted

## 2016-09-17 ENCOUNTER — Ambulatory Visit (INDEPENDENT_AMBULATORY_CARE_PROVIDER_SITE_OTHER): Payer: BC Managed Care – PPO | Admitting: Nurse Practitioner

## 2016-09-17 VITALS — BP 112/70 | HR 69 | Ht 60.0 in | Wt 114.4 lb

## 2016-09-17 DIAGNOSIS — G4489 Other headache syndrome: Secondary | ICD-10-CM | POA: Diagnosis not present

## 2016-09-17 DIAGNOSIS — R519 Headache, unspecified: Secondary | ICD-10-CM | POA: Insufficient documentation

## 2016-09-17 DIAGNOSIS — F0781 Postconcussional syndrome: Secondary | ICD-10-CM | POA: Diagnosis not present

## 2016-09-17 DIAGNOSIS — R51 Headache: Secondary | ICD-10-CM

## 2016-09-17 DIAGNOSIS — R42 Dizziness and giddiness: Secondary | ICD-10-CM | POA: Diagnosis not present

## 2016-09-17 NOTE — Telephone Encounter (Signed)
LMVM for pt to return call clarifying her current work of 3 half / wk or 3 full days per week ? So can write letter to keep her out  till the end of March.
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
## 2016-09-17 NOTE — Patient Instructions (Addendum)
Flexeril 5 mg at bedtime already ordered Try Claritin for seasonal allergies Given information on concussion Post-Concussion Syndrome Introduction Post-concussion syndrome describes the symptoms that can occur after a head injury. These symptoms can last from weeks to months. What are the causes? It is not clear why some head injuries cause post-concussion syndrome. It can occur whether your head injury was mild or severe and whether you were wearing head protection or not. What are the signs or symptoms?  Memory difficulties.  Dizziness.  Headaches.  Double vision or blurry vision.  Sensitivity to light.  Hearing difficulties.  Depression.  Tiredness.  Weakness.  Difficulty with concentration.  Difficulty sleeping or staying asleep.  Vomiting.  Poor balance or instability on your feet.  Slow reaction time.  Difficulty learning and remembering things you have heard. How is this diagnosed? There is no test to determine whether you have post-concussion syndrome. Your health care provider may order an imaging scan of your brain, such as a CT scan, to check for other problems that may be causing your symptoms (such as a severe injury inside your skull). How is this treated? Usually, these problems disappear over time without medical care. Your health care provider may prescribe medicine to help ease your symptoms. It is important to follow up with a neurologist to evaluate your recovery and address any lingering symptoms or issues. Follow these instructions at home:  Take medicines only as directed by your health care provider. Do not take aspirin. Aspirin can slow blood clotting.  Sleep with your head slightly elevated to help with headaches.  Avoid any situation where there is potential for another head injury. This includes football, hockey, soccer, basketball, martial arts, downhill snow sports, and horseback riding. Your condition will get worse every time you  experience a concussion. You should avoid these activities until you are evaluated by the appropriate follow-up health care providers.  Keep all follow-up visits as directed by your health care provider. This is important. Contact a health care provider if:  You have increased problems paying attention or concentrating.  You have increased difficulty remembering or learning new information.  You need more time to complete tasks or assignments than before.  You have increased irritability or decreased ability to cope with stress.  You have more symptoms than before. Seek medical care if you have any of the following symptoms for more than two weeks after your injury:  Lasting (chronic) headaches.  Dizziness or balance problems.  Nausea.  Vision problems.  Increased sensitivity to noise or light.  Depression or mood swings.  Anxiety or irritability.  Memory problems.  Difficulty concentrating or paying attention.  Sleep problems.  Feeling tired all the time. Get help right away if:  You have confusion or unusual drowsiness.  Others find it difficult to wake you up.  You have nausea or persistent, forceful vomiting.  You feel like you are moving when you are not (vertigo). Your eyes may move rapidly back and forth.  You have convulsions or faint.  You have severe, persistent headaches that are not relieved by medicine.  You cannot use your arms or legs normally.  One of your pupils is larger than the other.  You have clear or bloody discharge from your nose or ears.  Your problems are getting worse, not better. This information is not intended to replace advice given to you by your health care provider. Make sure you discuss any questions you have with your health care provider. Document Released:  12/29/2001 Document Revised: 01/27/2016 Document Reviewed: 10/14/2013  2017 Elsevier

## 2016-09-18 ENCOUNTER — Encounter: Payer: Self-pay | Admitting: Nurse Practitioner

## 2016-09-18 NOTE — Telephone Encounter (Signed)
Patient is returning your call. She works 3 full days a week.

## 2016-09-18 NOTE — Telephone Encounter (Signed)
Spoke to pt to let her know letter ready of pick up at front desk.

## 2016-09-18 NOTE — Telephone Encounter (Signed)
LMVM again at home #, about her work.

## 2016-09-30 NOTE — Progress Notes (Signed)
Personally  participated in, made any corrections needed, and agree with history, physical, neuro exam,assessment and plan as stated above.    Kross Swallows, MD [facility] 

## 2016-10-01 ENCOUNTER — Ambulatory Visit: Payer: BC Managed Care – PPO | Attending: Neurology | Admitting: Physical Therapy

## 2016-10-01 DIAGNOSIS — R42 Dizziness and giddiness: Secondary | ICD-10-CM | POA: Diagnosis not present

## 2016-10-01 DIAGNOSIS — M542 Cervicalgia: Secondary | ICD-10-CM | POA: Insufficient documentation

## 2016-10-01 DIAGNOSIS — R293 Abnormal posture: Secondary | ICD-10-CM | POA: Insufficient documentation

## 2016-10-01 NOTE — Therapy (Signed)
[facility] [address]  [place], [place], [address] Phone: [phone]   Fax:  [phone]  Physical Therapy Treatment  Patient Details  Name: Jennifer Manning MRN: [id number] Date of Birth: [birth year] Referring Provider: Dr. Melvenia Beam  Encounter Date: 10/01/2016      PT End of Session - 10/01/16 1145    Visit Number 15   Number of Visits 30   Date for PT Re-Evaluation 11/26/16   PT Start Time 0844   PT Stop Time 0930   PT Time Calculation (min) 46 min   Activity Tolerance Patient tolerated treatment well   Behavior During Therapy [facility] for tasks assessed/performed      Past Medical History:  Diagnosis Date  . Migraines     Past Surgical History:  Procedure Laterality Date  . TONSILLECTOMY    . TUBAL LIGATION      There were no vitals filed for this visit.      Subjective Assessment - 10/01/16 1130    Subjective Reduction in nausea. Feels like she is getting her appetite back. Saw neuro MD last week - wants to continue PT.   Diagnostic tests Xray and MRI - no diagnostic findings   Patient Stated Goals return to work, reduced headachs and dizziness   Currently in Pain? No/denies   Pain Score 0-No pain                         [facility] Adult PT Treatment/Exercise - 10/01/16 0001      Neck Exercises: Machines for Strengthening   UBE (Upper Arm Bike) level 3 x 6 minutes (3/3)     Neck Exercises: Sidelying   Other Sidelying Exercise "open book" for thoracic and cervical mobility - 5 reps for 10 sec each side     Neck Exercises: Prone   Other Prone Exercise quadruped: "thread the needle" for thoracic mobility - 5 reps x 10 sec each side     Manual Therapy   Manual Therapy Joint mobilization;Soft tissue mobilization;Myofascial release   Manual therapy comments patient supine   Joint Mobilization Grade II-III CPA form approx C3-T1 - slight "soreness" at palpation site - otherwise  tolerated joint mobs   Soft tissue mobilization STM to B cervical paraspinals, B UT, B levator scap, and B suboccipital mm - paitent reporting reduced overall muscular tightness and reduced tenderness   Myofascial Release suboccipital release 1 x 1 minute          Vestibular Treatment/Exercise - 10/01/16 0001      Vestibular Treatment/Exercise   Gaze Exercises X1 Viewing Horizontal;X1 Viewing Vertical     X1 Viewing Horizontal   Foot Position seated   Reps 3   Comments 30 sec     X1 Viewing Vertical   Foot Position seated   Reps 3   Comments 30 sec                    PT Long Term Goals - 10/01/16 1145      PT LONG TERM GOAL #1   Title patient to be independent with HEP (08/31/16)   Status Achieved     PT LONG TERM GOAL #2   Title Patient to demonstrate active cervical ROM with no increase in pain for improved function (08/31/16)   Status Achieved     PT LONG TERM GOAL #3   Title Patient to verbalize and demonstrate proper posture/body  mechanics for reduced stress placed on neck/upper back musculature (08/31/16)   Status On-going     PT LONG TERM GOAL #4   Title Patient to report ability to navigate busy environment with dizziness no greater than 2/10 (11/26/16)   Status On-going     PT LONG TERM GOAL #5   Title Patient to report dizziness no greater than 1/10 with all head movements and positional changes (11/26/16)   Status On-going     PT LONG TERM GOAL #6   Title Patient ot report ability to return to work full time with no increased in nausea or dizziness (11/26/16)   Status New               Plan - 10/01/16 1148    Clinical Impression Statement Patient today with subjective rpeorts of improed symptoms since last visit with PT. Patient continues to have headaches as well as pain into neck and shoulders - however, reduced per patient report. Patient with some conitnued dizziness with quick movements as well as with scolling of phone and tv. Patient  reports she has followed up with neuro MD - who wishes to continue PT at this time. Will extend patients plan of care for 8 weeks for 1-2x/week with hopeful discharge prior to this date.    Rehab Potential Good   PT Frequency 2x / week  taper to 1x/week   PT Duration 8 weeks   PT Treatment/Interventions ADLs/Self Care Home Management;Electrical Stimulation;Moist Heat;Traction;Ultrasound;Therapeutic exercise;Therapeutic activities;Patient/family education;Manual techniques;Passive range of motion;Vestibular;Visual/perceptual remediation/compensation;Taping   PT Next Visit Plan progress VOR as tolerated, manual therapy for improve tissue quality, modalities prn as needed   Consulted and Agree with Plan of Care Patient      Patient will benefit from skilled therapeutic intervention in order to improve the following deficits and impairments:  Decreased activity tolerance, Decreased balance, Decreased range of motion, Dizziness, Pain  Visit Diagnosis: Dizziness and giddiness - Plan: PT plan of care cert/re-cert  Cervicalgia - Plan: PT plan of care cert/re-cert  Abnormal posture - Plan: PT plan of care cert/re-cert     Problem List Patient Active Problem List   Diagnosis Date Noted  . Dizziness 09/17/2016  . Headache 09/17/2016  . Post concussion syndrome 05/17/2016  . Vitamin D deficiency 12/20/2013     Lanney Gins, PT, DPT 10/01/16 12:07 PM   [facility] [address]  [place], [place], [address] Phone: [phone]   Fax:  [phone]  Name: Jennifer Manning MRN: [id number] Date of Birth: November 09, 1970

## 2016-10-04 ENCOUNTER — Ambulatory Visit: Payer: BC Managed Care – PPO | Admitting: Physical Therapy

## 2016-10-11 ENCOUNTER — Ambulatory Visit: Payer: BC Managed Care – PPO | Admitting: Physical Therapy

## 2016-10-11 DIAGNOSIS — R42 Dizziness and giddiness: Secondary | ICD-10-CM

## 2016-10-11 DIAGNOSIS — M542 Cervicalgia: Secondary | ICD-10-CM

## 2016-10-11 DIAGNOSIS — R293 Abnormal posture: Secondary | ICD-10-CM

## 2016-10-12 NOTE — Therapy (Signed)
[facility] [address]  [place], [place], [address] Phone: [phone]   Fax:  [phone]  Physical Therapy Treatment  Patient Details  Name: NIASHA DEVINS MRN: [id number] Date of Birth: [birth year] Referring Provider: Dr. Melvenia Beam  Encounter Date: 10/11/2016      PT End of Session - 10/12/16 0939    Visit Number 16   Number of Visits 30   Date for PT Re-Evaluation 11/26/16   PT Start Time 1449   PT Stop Time 1529   PT Time Calculation (min) 40 min   Activity Tolerance Patient tolerated treatment well   Behavior During Therapy [facility] for tasks assessed/performed      Past Medical History:  Diagnosis Date  . Migraines     Past Surgical History:  Procedure Laterality Date  . TONSILLECTOMY    . TUBAL LIGATION      There were no vitals filed for this visit.      Subjective Assessment - 10/11/16 1459    Subjective "have had some really good days" - very minimal soreness, able to start returning to exercise, no vomitting. Had one bad day after watching action movie which brought on nausea and vomitting.    Diagnostic tests Xray and MRI - no diagnostic findings   Patient Stated Goals return to work, reduced headachs and dizziness   Currently in Pain? No/denies   Pain Score 0-No pain                         [facility] Adult PT Treatment/Exercise - 10/12/16 0001      Neuro Re-ed    Neuro Re-ed Details  seated fixation with head turns 90 degrees R and L x 15 each - progression to standing x 15 each side; standing head turn then body turn 90 degrees with fixation x 15 each side; head and body turn at same time with fixation 2 x 15 each side progressing speed; standing 90 degree head turns on foam x 15 each side;     Neck Exercises: Stretches   Upper Trapezius Stretch 3 reps;30 seconds  B sides   Levator Stretch 3 reps;30 seconds  B sides         Vestibular Treatment/Exercise -  10/12/16 0001      X1 Viewing Horizontal   Foot Position seated   Reps 1   Comments 45 sec     X1 Viewing Vertical   Foot Position seated   Reps 1   Comments 45 sec                    PT Long Term Goals - 10/01/16 1145      PT LONG TERM GOAL #1   Title patient to be independent with HEP (08/31/16)   Status Achieved     PT LONG TERM GOAL #2   Title Patient to demonstrate active cervical ROM with no increase in pain for improved function (08/31/16)   Status Achieved     PT LONG TERM GOAL #3   Title Patient to verbalize and demonstrate proper posture/body mechanics for reduced stress placed on neck/upper back musculature (08/31/16)   Status On-going     PT LONG TERM GOAL #4   Title Patient to report ability to navigate busy environment with dizziness no greater than 2/10 (11/26/16)   Status On-going     PT LONG TERM GOAL #5   Title Patient  to report dizziness no greater than 1/10 with all head movements and positional changes (11/26/16)   Status On-going     PT LONG TERM GOAL #6   Title Patient ot report ability to return to work full time with no increased in nausea or dizziness (11/26/16)   Status New               Plan - 10/12/16 0939    Clinical Impression Statement Saloni doing very well today - feels like she is "turning a corner." Able to begin light exercise as well as subjective reports of overall reduced N&V - however did have a bout of nausea following watching action movie. PAtient doing well with vestibular treatment progressions with only slight increase in "swimminess" and nausea with treatment today.    PT Treatment/Interventions ADLs/Self Care Home Management;Electrical Stimulation;Moist Heat;Traction;Ultrasound;Therapeutic exercise;Therapeutic activities;Patient/family education;Manual techniques;Passive range of motion;Vestibular;Visual/perceptual remediation/compensation;Taping   PT Next Visit Plan progress VOR as tolerated, manual therapy for  improve tissue quality, modalities prn as needed   Consulted and Agree with Plan of Care Patient      Patient will benefit from skilled therapeutic intervention in order to improve the following deficits and impairments:  Decreased activity tolerance, Decreased balance, Decreased range of motion, Dizziness, Pain  Visit Diagnosis: Dizziness and giddiness  Cervicalgia  Abnormal posture     Problem List Patient Active Problem List   Diagnosis Date Noted  . Dizziness 09/17/2016  . Headache 09/17/2016  . Post concussion syndrome 05/17/2016  . Vitamin D deficiency 12/20/2013    Lanney Gins, PT, DPT 10/12/16 9:41 AM   [facility] [address]  [place], [place], [address] Phone: [phone]   Fax:  [phone]  Name: JUDENE LOGUE MRN: [id number] Date of Birth: 10-10-1970

## 2016-10-15 ENCOUNTER — Ambulatory Visit: Payer: BC Managed Care – PPO | Admitting: Physical Therapy

## 2016-10-15 DIAGNOSIS — M542 Cervicalgia: Secondary | ICD-10-CM

## 2016-10-15 DIAGNOSIS — R293 Abnormal posture: Secondary | ICD-10-CM

## 2016-10-15 DIAGNOSIS — R42 Dizziness and giddiness: Secondary | ICD-10-CM | POA: Diagnosis not present

## 2016-10-15 NOTE — Therapy (Signed)
[facility] [address]  [place], [place], [address] Phone: [phone]   Fax:  [phone]  Physical Therapy Treatment  Patient Details  Name: Jennifer Manning MRN: [id number] Date of Birth: 11/26/1970 Referring Provider: Dr. Melvenia Beam  Encounter Date: 10/15/2016      PT End of Session - 10/15/16 1514    Visit Number 17   Number of Visits 30   Date for PT Re-Evaluation 11/26/16   PT Start Time 1449   PT Stop Time 1530   PT Time Calculation (min) 41 min   Activity Tolerance Patient tolerated treatment well   Behavior During Therapy [facility] for tasks assessed/performed      Past Medical History:  Diagnosis Date  . Migraines     Past Surgical History:  Procedure Laterality Date  . TONSILLECTOMY    . TUBAL LIGATION      There were no vitals filed for this visit.      Subjective Assessment - 10/15/16 1451    Subjective "has had a few bad days" - some increase N&V (3 episodes) - no known reason for flare in symptoms   Diagnostic tests Xray and MRI - no diagnostic findings   Patient Stated Goals return to work, reduced headachs and dizziness   Currently in Pain? Yes   Pain Score 2    Pain Location Head   Pain Orientation Right;Left;Posterior   Pain Descriptors / Indicators Headache;Aching   Pain Type Acute pain                         [facility] Adult PT Treatment/Exercise - 10/15/16 1515      Neuro Re-ed    Neuro Re-ed Details  seated fixation with 90 degree head turns x 15 each side; standing fixation with 90 degree head turns x 15 each side     Neck Exercises: Machines for Strengthening   UBE (Upper Arm Bike) level 3.5 x 6 minutes (3/3)     Manual Therapy   Manual Therapy Soft tissue mobilization;Myofascial release   Manual therapy comments patient supine   Soft tissue mobilization STM to B UT, B LS, B cervical paraspinals, B suboccipital mm, B SCM - tightness noted throughout  anterior and posterior cervical musculature   Myofascial Release suboccipital release 3 x 1 minute - pain/headache reproduction         Vestibular Treatment/Exercise - 10/15/16 0001      Vestibular Treatment/Exercise   Gaze Exercises X1 Viewing Horizontal;X1 Viewing Vertical     X1 Viewing Horizontal   Foot Position seated   Reps 3   Comments 30-45 sec     X1 Viewing Vertical   Foot Position seated   Reps 3   Comments 30-45 sec                    PT Long Term Goals - 10/01/16 1145      PT LONG TERM GOAL #1   Title patient to be independent with HEP (08/31/16)   Status Achieved     PT LONG TERM GOAL #2   Title Patient to demonstrate active cervical ROM with no increase in pain for improved function (08/31/16)   Status Achieved     PT LONG TERM GOAL #3   Title Patient to verbalize and demonstrate proper posture/body mechanics for reduced stress placed on neck/upper back musculature (08/31/16)   Status On-going  PT LONG TERM GOAL #4   Title Patient to report ability to navigate busy environment with dizziness no greater than 2/10 (11/26/16)   Status On-going     PT LONG TERM GOAL #5   Title Patient to report dizziness no greater than 1/10 with all head movements and positional changes (11/26/16)   Status On-going     PT LONG TERM GOAL #6   Title Patient ot report ability to return to work full time with no increased in nausea or dizziness (11/26/16)   Status New               Plan - 10/15/16 1514    Clinical Impression Statement Vestibular treatment slowed down today due to increase in N&V symptoms since last visit. Manual therapy continued today due to subjective reports of increased tightness and soreness in cervical musculature with tightness noted throughout with palpation. Will continue to progress head turns and gaze fixation as tolerated.    PT Treatment/Interventions ADLs/Self Care Home Management;Electrical Stimulation;Moist  Heat;Traction;Ultrasound;Therapeutic exercise;Therapeutic activities;Patient/family education;Manual techniques;Passive range of motion;Vestibular;Visual/perceptual remediation/compensation;Taping   PT Next Visit Plan progress VOR as tolerated, manual therapy for improve tissue quality, modalities prn as needed   Consulted and Agree with Plan of Care Patient      Patient will benefit from skilled therapeutic intervention in order to improve the following deficits and impairments:  Decreased activity tolerance, Decreased balance, Decreased range of motion, Dizziness, Pain  Visit Diagnosis: Dizziness and giddiness  Cervicalgia  Abnormal posture     Problem List Patient Active Problem List   Diagnosis Date Noted  . Dizziness 09/17/2016  . Headache 09/17/2016  . Post concussion syndrome 05/17/2016  . Vitamin D deficiency 12/20/2013     Lanney Gins, PT, DPT 10/15/16 3:50 PM   [facility] [address]  [place], [place], [address] Phone: [phone]   Fax:  [phone]  Name: Jennifer Manning MRN: [id number] Date of Birth: [birth year]

## 2016-10-22 ENCOUNTER — Ambulatory Visit: Payer: BC Managed Care – PPO | Attending: Neurology | Admitting: Physical Therapy

## 2016-10-22 DIAGNOSIS — M542 Cervicalgia: Secondary | ICD-10-CM | POA: Insufficient documentation

## 2016-10-22 DIAGNOSIS — R293 Abnormal posture: Secondary | ICD-10-CM

## 2016-10-22 DIAGNOSIS — R42 Dizziness and giddiness: Secondary | ICD-10-CM | POA: Diagnosis not present

## 2016-10-22 NOTE — Therapy (Signed)
[facility] [address]  [place], [place], [address] Phone: [phone]   Fax:  [phone]  Physical Therapy Treatment  Patient Details  Name: Jennifer Manning MRN: [id number] Date of Birth: [birth year] Referring Provider: Dr. Melvenia Beam  Encounter Date: 10/22/2016      PT End of Session - 10/22/16 0803    Visit Number 18   Number of Visits 30   Date for PT Re-Evaluation 11/26/16   PT Start Time 0800   PT Stop Time 0843   PT Time Calculation (min) 43 min   Activity Tolerance Patient tolerated treatment well   Behavior During Therapy [facility] for tasks assessed/performed      Past Medical History:  Diagnosis Date  . Migraines     Past Surgical History:  Procedure Laterality Date  . TONSILLECTOMY    . TUBAL LIGATION      There were no vitals filed for this visit.      Subjective Assessment - 10/22/16 0802    Subjective Feeling well - does still have dizziness with turning too quickly   Diagnostic tests Xray and MRI - no diagnostic findings   Patient Stated Goals return to work, reduced headachs and dizziness   Currently in Pain? No/denies   Pain Score 0-No pain                         [facility] Adult PT Treatment/Exercise - 10/22/16 9833      Neuro Re-ed    Neuro Re-ed Details  seated gaze fixation with 90 degree head turn x 15 reps in all directions (L, R, up, down); standing gaze fixation with 90 degree head turn x 15 reps (L and R); scanning environment for cards - difficulty scanning left      Neck Exercises: Machines for Strengthening   UBE (Upper Arm Bike) Level 3.5 x 4 minutes (2/2)     Manual Therapy   Manual Therapy Soft tissue mobilization   Manual therapy comments patient seated   Soft tissue mobilization STM to B cervical and thoracis paraspinals, B LS, B UT   Other Manual Therapy discussion of using small lumbar roll while driving for long periods, as well as prone on  elbows for gentle low back extension      Neck Exercises: Stretches   Upper Trapezius Stretch 3 reps;30 seconds  bilateral   Levator Stretch 3 reps;30 seconds  bilateral         Vestibular Treatment/Exercise - 10/22/16 0001      Vestibular Treatment/Exercise   Gaze Exercises X1 Viewing Horizontal;X1 Viewing Vertical     X1 Viewing Horizontal   Foot Position seated   Reps 3   Comments 30-45 sec     X1 Viewing Vertical   Foot Position seated   Reps 3   Comments 30-45 sec                    PT Long Term Goals - 10/01/16 1145      PT LONG TERM GOAL #1   Title patient to be independent with HEP (08/31/16)   Status Achieved     PT LONG TERM GOAL #2   Title Patient to demonstrate active cervical ROM with no increase in pain for improved function (08/31/16)   Status Achieved     PT LONG TERM GOAL #3   Title Patient to verbalize and demonstrate proper posture/body mechanics for reduced  stress placed on neck/upper back musculature (08/31/16)   Status On-going     PT LONG TERM GOAL #4   Title Patient to report ability to navigate busy environment with dizziness no greater than 2/10 (11/26/16)   Status On-going     PT LONG TERM GOAL #5   Title Patient to report dizziness no greater than 1/10 with all head movements and positional changes (11/26/16)   Status On-going     PT LONG TERM GOAL #6   Title Patient ot report ability to return to work full time with no increased in nausea or dizziness (11/26/16)   Status New               Plan - 10/22/16 0804    Clinical Impression Statement Patient doing well today - reports most difficulty with scanning for objects in grocery store with L side most problematic. Doing well with seated and standing vestibular gaze exercises. Scanning environment with object retrieval introduced today with patient preferring to only scan to R - able to scan L after pausing and focusing attention to L, however increased dizziness with side to  side scanning. Will continue to progress this as this is patients most limiting factor in the community.    PT Treatment/Interventions ADLs/Self Care Home Management;Electrical Stimulation;Moist Heat;Traction;Ultrasound;Therapeutic exercise;Therapeutic activities;Patient/family education;Manual techniques;Passive range of motion;Vestibular;Visual/perceptual remediation/compensation;Taping   PT Next Visit Plan progress VOR as tolerated, manual therapy for improve tissue quality, modalities prn as needed   Consulted and Agree with Plan of Care Patient      Patient will benefit from skilled therapeutic intervention in order to improve the following deficits and impairments:  Decreased activity tolerance, Decreased balance, Decreased range of motion, Dizziness, Pain  Visit Diagnosis: Dizziness and giddiness  Cervicalgia  Abnormal posture     Problem List Patient Active Problem List   Diagnosis Date Noted  . Dizziness 09/17/2016  . Headache 09/17/2016  . Post concussion syndrome 05/17/2016  . Vitamin D deficiency 12/20/2013    Lanney Gins, PT, DPT 10/22/16 9:05 AM   [facility] [address]  [place], [place], [address] Phone: [phone]   Fax:  [phone]  Name: Jennifer Manning MRN: [id number] Date of Birth: Dec 11, 1970

## 2016-10-26 ENCOUNTER — Encounter: Payer: Self-pay | Admitting: *Deleted

## 2016-10-26 NOTE — Telephone Encounter (Signed)
Spoke with patient for update. She stated that even with OT and medications, she still has days of headaches and dizzy spells. She stated she wants to remain on 3 full time days a week at her job.  This RN advised she should come in for earlier FU than June to be evaluated again. Rescheduled her FU for first available with NP.  She stated she will still need a letter for her job until she sees NP.  Advised her this RN will place letter on NP desk who returns to office Monday. Patient verbalized understanding, appreciation. Letter will be given to NP on Monday for review.

## 2016-10-26 NOTE — Telephone Encounter (Signed)
Pt called requested a letter to continue to remain at 3 full days a week 4/9 thru 4/30.

## 2016-10-29 ENCOUNTER — Ambulatory Visit: Payer: BC Managed Care – PPO | Admitting: Physical Therapy

## 2016-10-29 DIAGNOSIS — R42 Dizziness and giddiness: Secondary | ICD-10-CM | POA: Diagnosis not present

## 2016-10-29 DIAGNOSIS — R293 Abnormal posture: Secondary | ICD-10-CM

## 2016-10-29 DIAGNOSIS — M542 Cervicalgia: Secondary | ICD-10-CM

## 2016-10-29 NOTE — Telephone Encounter (Signed)
LVM informing patient her letter is at front desk for pick up.

## 2016-10-29 NOTE — Therapy (Signed)
[facility] [address]  [place], [place], [address] Phone: [phone]   Fax:  [phone]  Physical Therapy Treatment  Patient Details  Name: Jennifer Manning MRN: [id number] Date of Birth: 03/31/1971 Referring Provider: Dr. Melvenia Beam  Encounter Date: 10/29/2016      PT End of Session - 10/29/16 1815    Visit Number 19   Number of Visits 30   Date for PT Re-Evaluation 11/26/16   PT Start Time 1702   PT Stop Time 1747   PT Time Calculation (min) 45 min   Activity Tolerance Patient tolerated treatment well   Behavior During Therapy [facility] for tasks assessed/performed      Past Medical History:  Diagnosis Date  . Migraines     Past Surgical History:  Procedure Laterality Date  . TONSILLECTOMY    . TUBAL LIGATION      There were no vitals filed for this visit.      Subjective Assessment - 10/29/16 1811    Subjective Some "swimminess" as well as neck pain today - Had 10/10 headache on Friday - no known reason   Diagnostic tests Xray and MRI - no diagnostic findings   Patient Stated Goals return to work, reduced headachs and dizziness   Currently in Pain? Yes   Pain Score 3    Pain Location Neck   Pain Orientation Right;Left;Posterior   Pain Descriptors / Indicators Aching                         [facility] Adult PT Treatment/Exercise - 10/29/16 0001      Neuro Re-ed    Neuro Re-ed Details  scanning environment for cards - much improved scanning with reduced nausea; seate gaze fization with 90 degree head turn x 15 each side; standing gaze fixation with 90 degreee head turn x 15 each side; standing head then body turn 90 degree x 10 each side; motion sensitivity exercise of nose to opposite knee in sitting x 5 each side     Neck Exercises: Machines for Strengthening   UBE (Upper Arm Bike) Level 3.5 x 6 mintues (3/3)         Vestibular Treatment/Exercise - 10/29/16 0001      Vestibular Treatment/Exercise   Gaze Exercises X1 Viewing Horizontal     X1 Viewing Horizontal   Foot Position seated   Comments eyes closed with head turn, open to ensure gaze fixation - approx 30-45 seconds x 2 reps                     PT Long Term Goals - 10/01/16 1145      PT LONG TERM GOAL #1   Title patient to be independent with HEP (08/31/16)   Status Achieved     PT LONG TERM GOAL #2   Title Patient to demonstrate active cervical ROM with no increase in pain for improved function (08/31/16)   Status Achieved     PT LONG TERM GOAL #3   Title Patient to verbalize and demonstrate proper posture/body mechanics for reduced stress placed on neck/upper back musculature (08/31/16)   Status On-going     PT LONG TERM GOAL #4   Title Patient to report ability to navigate busy environment with dizziness no greater than 2/10 (11/26/16)   Status On-going     PT LONG TERM GOAL #5   Title Patient to report dizziness no  greater than 1/10 with all head movements and positional changes (11/26/16)   Status On-going     PT LONG TERM GOAL #6   Title Patient ot report ability to return to work full time with no increased in nausea or dizziness (11/26/16)   Status New               Plan - 10/29/16 1815    Clinical Impression Statement Jonica doing well - does report some increased nausea as wellas cervial pain from over the weekend - was at amusement park and had difficulty looking at moving objects/people. Patient today working on both vestibular/VOR exercises as well as motion sensitivty exercises as patient reports difficulty bending down to tie shoes - does have an increase in "swimminess" when returning to erect position - all symptoms resolving in less than 30 seconds. Will continue to progress patient as tolerated with all vestibular/VOR treatment.    PT Treatment/Interventions ADLs/Self Care Home Management;Electrical Stimulation;Moist Heat;Traction;Ultrasound;Therapeutic  exercise;Therapeutic activities;Patient/family education;Manual techniques;Passive range of motion;Vestibular;Visual/perceptual remediation/compensation;Taping   PT Next Visit Plan progress VOR as tolerated, manual therapy for improve tissue quality, modalities prn as needed   Consulted and Agree with Plan of Care Patient      Patient will benefit from skilled therapeutic intervention in order to improve the following deficits and impairments:  Decreased activity tolerance, Decreased balance, Decreased range of motion, Dizziness, Pain  Visit Diagnosis: Dizziness and giddiness  Cervicalgia  Abnormal posture     Problem List Patient Active Problem List   Diagnosis Date Noted  . Dizziness 09/17/2016  . Headache 09/17/2016  . Post concussion syndrome 05/17/2016  . Vitamin D deficiency 12/20/2013    Lanney Gins, PT, DPT 10/29/16 6:19 PM   [facility] [address]  [place], [place], [address] Phone: [phone]   Fax:  [phone]  Name: Jennifer Manning MRN: [id number] Date of Birth: [birth year]

## 2016-11-05 ENCOUNTER — Ambulatory Visit: Payer: BC Managed Care – PPO | Admitting: Physical Therapy

## 2016-11-14 ENCOUNTER — Encounter: Payer: Self-pay | Admitting: *Deleted

## 2016-11-14 ENCOUNTER — Ambulatory Visit (INDEPENDENT_AMBULATORY_CARE_PROVIDER_SITE_OTHER): Payer: BC Managed Care – PPO | Admitting: Nurse Practitioner

## 2016-11-14 ENCOUNTER — Encounter: Payer: Self-pay | Admitting: Nurse Practitioner

## 2016-11-14 VITALS — BP 103/70 | HR 70 | Wt 117.0 lb

## 2016-11-14 DIAGNOSIS — F0781 Postconcussional syndrome: Secondary | ICD-10-CM | POA: Diagnosis not present

## 2016-11-14 DIAGNOSIS — R42 Dizziness and giddiness: Secondary | ICD-10-CM

## 2016-11-14 DIAGNOSIS — G4489 Other headache syndrome: Secondary | ICD-10-CM

## 2016-11-14 NOTE — Patient Instructions (Signed)
Flexeril  2.5 to 5 mg at bedtime  Continue Claritin for seasonal allergies Use Zofran as necessary for nausea Continue her physical therapy and home exercise program Follow-a regular  sleep-wake cycle MRI of the brain to follow up comcussion Will write a letter a letter to begin working 4 days a week next week for 1 month then increase to 5 days a week.  F/U prn
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
## 2016-11-15 NOTE — Progress Notes (Signed)
Personally have participated in and made any corrections needed to history, physical, neuro exam,assessment and plan as stated above.  I have personally evaluated lab date, reviewed imaging studies and agree with radiology interpretations.    Sarina Ill, MD

## 2016-11-20 ENCOUNTER — Other Ambulatory Visit: Payer: Self-pay | Admitting: Chiropractic Medicine

## 2016-11-20 DIAGNOSIS — M545 Low back pain, unspecified: Secondary | ICD-10-CM

## 2016-11-28 ENCOUNTER — Ambulatory Visit
Admission: RE | Admit: 2016-11-28 | Discharge: 2016-11-28 | Disposition: A | Discharge status: Home / Self Care | Payer: BC Managed Care – PPO | Source: Ambulatory Visit | Attending: Chiropractic Medicine | Admitting: Chiropractic Medicine

## 2016-11-28 ENCOUNTER — Ambulatory Visit (INDEPENDENT_AMBULATORY_CARE_PROVIDER_SITE_OTHER): Payer: BC Managed Care – PPO

## 2016-11-28 DIAGNOSIS — R42 Dizziness and giddiness: Secondary | ICD-10-CM

## 2016-11-28 DIAGNOSIS — G4489 Other headache syndrome: Secondary | ICD-10-CM

## 2016-11-28 DIAGNOSIS — M545 Low back pain, unspecified: Secondary | ICD-10-CM

## 2016-11-28 DIAGNOSIS — F0781 Postconcussional syndrome: Secondary | ICD-10-CM | POA: Diagnosis not present

## 2016-12-12 ENCOUNTER — Ambulatory Visit: Payer: BC Managed Care – PPO | Attending: Neurology | Admitting: Physical Therapy

## 2016-12-12 DIAGNOSIS — R293 Abnormal posture: Secondary | ICD-10-CM

## 2016-12-12 DIAGNOSIS — M542 Cervicalgia: Secondary | ICD-10-CM | POA: Diagnosis present

## 2016-12-12 DIAGNOSIS — R42 Dizziness and giddiness: Secondary | ICD-10-CM | POA: Insufficient documentation
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
## 2016-12-20 ENCOUNTER — Ambulatory Visit: Payer: BC Managed Care – PPO | Admitting: Physical Therapy

## 2016-12-26 ENCOUNTER — Ambulatory Visit: Payer: BC Managed Care – PPO | Admitting: Physical Therapy

## 2016-12-28 ENCOUNTER — Ambulatory Visit: Payer: BC Managed Care – PPO | Attending: Neurology | Admitting: Physical Therapy

## 2016-12-28 DIAGNOSIS — M542 Cervicalgia: Secondary | ICD-10-CM | POA: Diagnosis present

## 2016-12-28 DIAGNOSIS — R42 Dizziness and giddiness: Secondary | ICD-10-CM | POA: Diagnosis not present

## 2016-12-28 DIAGNOSIS — R293 Abnormal posture: Secondary | ICD-10-CM | POA: Diagnosis present

## 2016-12-28 NOTE — Patient Instructions (Signed)

## 2016-12-28 NOTE — Therapy (Signed)
[facility] [address]  [place], [place], [address] Phone: [phone]   Fax:  [phone]  Physical Therapy Treatment  Patient Details  Name: Jennifer Manning MRN: [id number] Date of Birth: 05-09-1971 Referring Provider: Dr. Melvenia Beam  Encounter Date: 12/28/2016      PT End of Session - 12/28/16 0943    Visit Number 21   Number of Visits 30   Date for PT Re-Evaluation 02/06/17   PT Start Time 0930   PT Stop Time 2637   PT Time Calculation (min) 44 min   Activity Tolerance Patient tolerated treatment well   Behavior During Therapy [facility] for tasks assessed/performed      Past Medical History:  Diagnosis Date  . Migraines     Past Surgical History:  Procedure Laterality Date  . TONSILLECTOMY    . TUBAL LIGATION      There were no vitals filed for this visit.      Subjective Assessment - 12/28/16 0934    Subjective Has been proctoring EOGs - feels like she is having some pain due to stress. Went to MD yesterday - wants her to switch chiropractors. Recommended dry needling.    Diagnostic tests Xray and MRI - no diagnostic findings   Patient Stated Goals return to work, reduced headachs and dizziness   Currently in Pain? Yes   Pain Score 2    Pain Location Neck   Pain Orientation Right;Left;Posterior   Pain Descriptors / Indicators Aching;Tightness   Pain Type Acute pain                         [facility] Adult PT Treatment/Exercise - 12/28/16 0001      Exercises   Exercises Lumbar     Neck Exercises: Machines for Strengthening   UBE (Upper Arm Bike) Level 3.5 x 6 mintues (3/3)     Lumbar Exercises: Stretches   Single Knee to Chest Stretch 5 reps;10 seconds     Lumbar Exercises: Supine   Ab Set 10 reps;5 seconds   Clam 10 reps;5 seconds   Clam Limitations green tband - unilateral   Bent Knee Raise 10 reps;5 seconds   Bent Knee Raise Limitations green tband   Bridge 10  reps;5 seconds   Isometric Hip Flexion 10 reps;5 seconds     Lumbar Exercises: Sidelying   Other Sidelying Lumbar Exercises open book 5 x 10 each side                     PT Long Term Goals - 12/13/16 0907      PT LONG TERM GOAL #3   Title Patient to verbalize and demonstrate proper posture/body mechanics for reduced stress placed on neck/upper back musculature (02/06/17)   Status On-going     PT LONG TERM GOAL #4   Title Patient to report ability to navigate busy environment with dizziness no greater than 2/10 (01/27/17)   Status On-going     PT LONG TERM GOAL #5   Title Patient to report dizziness no greater than 1/10 with all head movements and positional changes (7/8//18)   Status On-going     PT LONG TERM GOAL #6   Title Patient ot report ability to return to work full time with no increased in nausea or dizziness (01/27/17)   Status On-going     PT LONG TERM GOAL #7   Title Patient to demonstrate  good core activitation with all static and dynamic activities for reduced low back pain (01/27/17)   Status New               Plan - 12/28/16 0944    Clinical Impression Statement Patient well today - primary complaints of low back and neck pain with PT session focusing on lumbopelvic stabilization with good tolerance. Discussion of dry needling with information handout given - PT and patient agreeing on beginning this service at next visit for low back pain. Will continue to progress general flexibiity and strenghtening at upcoming visits.    PT Treatment/Interventions ADLs/Self Care Home Management;Electrical Stimulation;Moist Heat;Traction;Ultrasound;Therapeutic exercise;Therapeutic activities;Patient/family education;Manual techniques;Passive range of motion;Vestibular;Visual/perceptual remediation/compensation;Taping;Dry needling   Consulted and Agree with Plan of Care Patient      Patient will benefit from skilled therapeutic intervention in order to improve  the following deficits and impairments:  Decreased activity tolerance, Decreased balance, Decreased range of motion, Dizziness, Pain  Visit Diagnosis: Dizziness and giddiness  Cervicalgia  Abnormal posture     Problem List Patient Active Problem List   Diagnosis Date Noted  . Dizziness 09/17/2016  . Headache 09/17/2016  . Post concussion syndrome 05/17/2016  . Vitamin D deficiency 12/20/2013     Lanney Gins, PT, DPT 12/28/16 10:47 AM   [facility] [address]  [place], [place], [address] Phone: [phone]   Fax:  [phone]  Name: Jennifer Manning MRN: [id number] Date of Birth: 06/27/1971

## 2017-01-03 ENCOUNTER — Ambulatory Visit: Payer: BC Managed Care – PPO | Admitting: Physical Therapy

## 2017-01-03 DIAGNOSIS — M542 Cervicalgia: Secondary | ICD-10-CM

## 2017-01-03 DIAGNOSIS — R42 Dizziness and giddiness: Secondary | ICD-10-CM | POA: Diagnosis not present

## 2017-01-03 DIAGNOSIS — R293 Abnormal posture: Secondary | ICD-10-CM

## 2017-01-03 NOTE — Therapy (Signed)
[facility] [address]  [place], [place], [address] Phone: [phone]   Fax:  [phone]  Physical Therapy Treatment  Patient Details  Name: Jennifer Manning MRN: [id number] Date of Birth: 1971-05-19 Referring Provider: Dr. Melvenia Beam  Encounter Date: 01/03/2017      PT End of Session - 01/03/17 1619    Visit Number 22   Number of Visits 30   Date for PT Re-Evaluation 02/06/17   PT Start Time 1616   PT Stop Time 1700   PT Time Calculation (min) 44 min   Activity Tolerance Patient tolerated treatment well   Behavior During Therapy [facility] for tasks assessed/performed      Past Medical History:  Diagnosis Date  . Migraines     Past Surgical History:  Procedure Laterality Date  . TONSILLECTOMY    . TUBAL LIGATION      There were no vitals filed for this visit.      Subjective Assessment - 01/03/17 1618    Subjective Has had some pain/stiffness in back and neck - relates to end of school year.    Diagnostic tests Xray and MRI - no diagnostic findings   Patient Stated Goals return to work, reduced headachs and dizziness   Currently in Pain? Yes   Pain Score 3    Pain Location Neck  and back   Pain Orientation Posterior   Pain Descriptors / Indicators Aching;Tightness   Pain Type Acute pain                         [facility] Adult PT Treatment/Exercise - 01/03/17 0001      Lumbar Exercises: Aerobic   Stationary Bike L3 x 5 minutes     Lumbar Exercises: Standing   Other Standing Lumbar Exercises paloff press - green tband x 15 each side     Lumbar Exercises: Supine   Straight Leg Raise 15 reps   Straight Leg Raises Limitations B; with core engagement   Isometric Hip Flexion 15 reps;5 seconds     Manual Therapy   Manual Therapy Joint mobilization;Soft tissue mobilization;Myofascial release   Manual therapy comments patient supine with bolster under knees   Joint Mobilization  Grade II-III CPA of cervical spine from approx C3-T1   Soft tissue mobilization STM to B UT, B LS, B cervical paraspinals, B lumbar paraspinals, suboccipital mm   Myofascial Release suboccipital release 2 x 30 seconds; manual trigger point release to B UT     Neck Exercises: Stretches   Upper Trapezius Stretch 1 rep;60 seconds  bilateral; manual by PT          Trigger Point Dry Needling - 01/03/17 1752    Consent Given? Yes   Education Handout Provided Yes   Muscles Treated Lower Body --  lumbar multifidi, iliocostalis lumborum - twitch response              PT Education - 01/03/17 1759    Education provided Yes   Education Details dry needling   Person(s) Educated Patient   Methods Explanation;Handout   Comprehension Verbalized understanding             PT Long Term Goals - 12/13/16 0907      PT LONG TERM GOAL #3   Title Patient to verbalize and demonstrate proper posture/body mechanics for reduced stress placed on neck/upper back musculature (02/06/17)   Status On-going  PT LONG TERM GOAL #4   Title Patient to report ability to navigate busy environment with dizziness no greater than 2/10 (01/27/17)   Status On-going     PT LONG TERM GOAL #5   Title Patient to report dizziness no greater than 1/10 with all head movements and positional changes (7/8//18)   Status On-going     PT LONG TERM GOAL #6   Title Patient ot report ability to return to work full time with no increased in nausea or dizziness (01/27/17)   Status On-going     PT LONG TERM GOAL #7   Title Patient to demonstrate good core activitation with all static and dynamic activities for reduced low back pain (01/27/17)   Status New               Plan - 01/03/17 1801    Clinical Impression Statement Patient and PT discussing utilization of dry needling today with patient willing to proceed with this treatment. PT perfomring DN to R lower lumbar multifidi and R iliocostalis lumborum with  good response. Patient with some autonomic response of sweating, nausea, and feeling faint - however, quickly resolving with reassurance of treatment. Some pain in C-spine follwoing treatment due to patient positioning, therefore heavy manual continued in this area.    PT Treatment/Interventions ADLs/Self Care Home Management;Electrical Stimulation;Moist Heat;Traction;Ultrasound;Therapeutic exercise;Therapeutic activities;Patient/family education;Manual techniques;Passive range of motion;Vestibular;Visual/perceptual remediation/compensation;Taping;Dry needling   PT Next Visit Plan progress VOR as tolerated, manual therapy for improve tissue quality, modalities prn as needed; will plan to include core work   Consulted and Agree with Plan of Care Patient      Patient will benefit from skilled therapeutic intervention in order to improve the following deficits and impairments:  Decreased activity tolerance, Decreased balance, Decreased range of motion, Dizziness, Pain  Visit Diagnosis: Dizziness and giddiness  Cervicalgia  Abnormal posture     Problem List Patient Active Problem List   Diagnosis Date Noted  . Dizziness 09/17/2016  . Headache 09/17/2016  . Post concussion syndrome 05/17/2016  . Vitamin D deficiency 12/20/2013    Lanney Gins, PT, DPT 01/03/17 6:07 PM   [facility] [address]  [place], [place], [address] Phone: [phone]   Fax:  [phone]  Name: ANNAMAY LAYMON MRN: [id number] Date of Birth: [birth year]

## 2017-01-03 NOTE — Patient Instructions (Signed)

## 2017-01-09 ENCOUNTER — Ambulatory Visit: Payer: BC Managed Care – PPO | Admitting: Physical Therapy

## 2017-01-09 DIAGNOSIS — R293 Abnormal posture: Secondary | ICD-10-CM

## 2017-01-09 DIAGNOSIS — R42 Dizziness and giddiness: Secondary | ICD-10-CM | POA: Diagnosis not present

## 2017-01-09 DIAGNOSIS — M542 Cervicalgia: Secondary | ICD-10-CM

## 2017-01-09 NOTE — Therapy (Signed)
[facility] [address]  [place], [place], [address] Phone: [phone]   Fax:  [phone]  Physical Therapy Treatment  Patient Details  Name: Jennifer Manning MRN: [id number] Date of Birth: 02-21-1971 Referring Provider: Dr. Melvenia Beam  Encounter Date: 01/09/2017      PT End of Session - 01/09/17 0918    Visit Number 23   Number of Visits 30   Date for PT Re-Evaluation 02/06/17   PT Start Time 0846   PT Stop Time 0943  moist heat   PT Time Calculation (min) 57 min   Activity Tolerance Patient tolerated treatment well   Behavior During Therapy [facility] for tasks assessed/performed      Past Medical History:  Diagnosis Date  . Migraines     Past Surgical History:  Procedure Laterality Date  . TONSILLECTOMY    . TUBAL LIGATION      There were no vitals filed for this visit.      Subjective Assessment - 01/09/17 0915    Subjective Felt great relief from DN - able to sit on toilet with full weight for first time since injury. Wants to continue DN today   Diagnostic tests Xray and MRI - no diagnostic findings   Patient Stated Goals return to work, reduced headachs and dizziness   Currently in Pain? Yes   Pain Score 1    Pain Location Back   Pain Orientation Right;Left;Lower   Pain Descriptors / Indicators Aching;Sore   Pain Type Acute pain                         [facility] Adult PT Treatment/Exercise - 01/09/17 0001      Lumbar Exercises: Aerobic   Stationary Bike L3 x 6 minutes     Lumbar Exercises: Supine   Clam 15 reps;5 seconds   Clam Limitations green tband - unilateral   Bent Knee Raise 15 reps;5 seconds   Bent Knee Raise Limitations green tband   Bridge 15 reps;5 seconds   Bridge Limitations green tband     Modalities   Modalities Moist Heat     Moist Heat Therapy   Number Minutes Moist Heat 15 Minutes   Moist Heat Location Cervical;Lumbar Spine     Manual Therapy    Manual Therapy Soft tissue mobilization;Myofascial release   Manual therapy comments patient prone   Soft tissue mobilization STM to B lumbar paraspinals   Myofascial Release manual trigger point release           Trigger Point Dry Needling - 01/09/17 0926    Consent Given? Yes   Muscles Treated Lower Body --  R L4-5, L5-S1 multifidi; L lumbar iliocostalis lumbar                   PT Long Term Goals - 12/13/16 0907      PT LONG TERM GOAL #3   Title Patient to verbalize and demonstrate proper posture/body mechanics for reduced stress placed on neck/upper back musculature (02/06/17)   Status On-going     PT LONG TERM GOAL #4   Title Patient to report ability to navigate busy environment with dizziness no greater than 2/10 (01/27/17)   Status On-going     PT LONG TERM GOAL #5   Title Patient to report dizziness no greater than 1/10 with all head movements and positional changes (7/8//18)   Status On-going  PT LONG TERM GOAL #6   Title Patient ot report ability to return to work full time with no increased in nausea or dizziness (01/27/17)   Status On-going     PT LONG TERM GOAL #7   Title Patient to demonstrate good core activitation with all static and dynamic activities for reduced low back pain (01/27/17)   Status New               Plan - 01/09/17 1022    Clinical Impression Statement Gentri noting great benefit from DN last treatment with ability to fully bear weight while sitting on toilet as well as ability to engage in excercise with her children. Patient and PT agreeing on continuing DN today with patient noting immediate relief upon returning to supine laying and standing. Good work with core stabilization with good progress towards goals.    PT Treatment/Interventions ADLs/Self Care Home Management;Electrical Stimulation;Moist Heat;Traction;Ultrasound;Therapeutic exercise;Therapeutic activities;Patient/family education;Manual techniques;Passive range  of motion;Vestibular;Visual/perceptual remediation/compensation;Taping;Dry needling   PT Next Visit Plan progress VOR as tolerated, manual therapy for improve tissue quality, modalities prn as needed; will plan to include core work   Consulted and Agree with Plan of Care Patient      Patient will benefit from skilled therapeutic intervention in order to improve the following deficits and impairments:  Decreased activity tolerance, Decreased balance, Decreased range of motion, Dizziness, Pain  Visit Diagnosis: Dizziness and giddiness  Cervicalgia  Abnormal posture     Problem List Patient Active Problem List   Diagnosis Date Noted  . Dizziness 09/17/2016  . Headache 09/17/2016  . Post concussion syndrome 05/17/2016  . Vitamin D deficiency 12/20/2013     Lanney Gins, PT, DPT 01/09/17 10:31 AM   [facility] [address]  [place], [place], [address] Phone: [phone]   Fax:  [phone]  Name: Jennifer Manning MRN: [id number] Date of Birth: 1971/04/08

## 2017-01-15 ENCOUNTER — Ambulatory Visit: Payer: BC Managed Care – PPO | Admitting: Nurse Practitioner

## 2017-01-28 ENCOUNTER — Ambulatory Visit: Payer: BC Managed Care – PPO | Attending: Neurology | Admitting: Physical Therapy

## 2017-01-28 DIAGNOSIS — M542 Cervicalgia: Secondary | ICD-10-CM

## 2017-01-28 DIAGNOSIS — M545 Low back pain: Secondary | ICD-10-CM | POA: Insufficient documentation

## 2017-01-28 DIAGNOSIS — R42 Dizziness and giddiness: Secondary | ICD-10-CM | POA: Diagnosis not present

## 2017-01-28 DIAGNOSIS — R293 Abnormal posture: Secondary | ICD-10-CM | POA: Insufficient documentation

## 2017-01-28 NOTE — Therapy (Signed)
[facility] [address]  [place], [place], [address] Phone: [phone]   Fax:  [phone]  Physical Therapy Treatment  Patient Details  Name: Jennifer Manning MRN: [id number] Date of Birth: 05-03-1971 Referring Provider: Dr. Melvenia Beam  Encounter Date: 01/28/2017      PT End of Session - 01/28/17 1029    Visit Number 24   Number of Visits 30   Date for PT Re-Evaluation 02/06/17   PT Start Time 1021   PT Stop Time 1107   PT Time Calculation (min) 46 min   Activity Tolerance Patient tolerated treatment well   Behavior During Therapy [facility] for tasks assessed/performed      Past Medical History:  Diagnosis Date  . Migraines     Past Surgical History:  Procedure Laterality Date  . TONSILLECTOMY    . TUBAL LIGATION      There were no vitals filed for this visit.      Subjective Assessment - 01/28/17 1025    Subjective Is feeling a difference in low back - able to go to gym 3x/week and lift small weights   Diagnostic tests Xray and MRI - no diagnostic findings   Patient Stated Goals return to work, reduced headachs and dizziness   Currently in Pain? Yes   Pain Score 4   4-5/10 in low back   Pain Location Neck   Pain Orientation Posterior   Pain Descriptors / Indicators Aching;Sore;Dull                         [facility] Adult PT Treatment/Exercise - 01/28/17 0001      Neck Exercises: Prone   Other Prone Exercise I's, T's, Y's - over green pball x 15 reps each - 1# in B UE     Lumbar Exercises: Stretches   Double Knee to Chest Stretch 3 reps;30 seconds     Lumbar Exercises: Aerobic   Stationary Bike L3 x 6 minutes     Manual Therapy   Manual Therapy Soft tissue mobilization;Joint mobilization   Manual therapy comments patient prone   Joint Mobilization Grade II-III CPAs of lumbar segments   Soft tissue mobilization STM to R lumbar paraspinals          Trigger Point Dry  Needling - 01/28/17 1320    Consent Given? Yes   Muscles Treated Lower Body --  R L2-3, L3-4, L4-5 multifidi; R lower lumbar iliocostalis                    PT Long Term Goals - 12/13/16 0907      PT LONG TERM GOAL #3   Title Patient to verbalize and demonstrate proper posture/body mechanics for reduced stress placed on neck/upper back musculature (02/06/17)   Status On-going     PT LONG TERM GOAL #4   Title Patient to report ability to navigate busy environment with dizziness no greater than 2/10 (01/27/17)   Status On-going     PT LONG TERM GOAL #5   Title Patient to report dizziness no greater than 1/10 with all head movements and positional changes (7/8//18)   Status On-going     PT LONG TERM GOAL #6   Title Patient ot report ability to return to work full time with no increased in nausea or dizziness (01/27/17)   Status On-going     PT LONG TERM GOAL #7   Title Patient  to demonstrate good core activitation with all static and dynamic activities for reduced low back pain (01/27/17)   Status New               Plan - 01/28/17 1114    Clinical Impression Statement Nicosha continues to note benefit from dry needling at low back - good ability to sit with full weight bearing as well as return to gym/lifitng low weights. Patient discussing with PT that she is considering DN at cervical spine and upper back at next visit as patient continues to have pain in this area. Patient with excellent compliance with HEP and eagerness to return to PLOF.    PT Treatment/Interventions ADLs/Self Care Home Management;Electrical Stimulation;Moist Heat;Traction;Ultrasound;Therapeutic exercise;Therapeutic activities;Patient/family education;Manual techniques;Passive range of motion;Vestibular;Visual/perceptual remediation/compensation;Taping;Dry needling   Consulted and Agree with Plan of Care Patient      Patient will benefit from skilled therapeutic intervention in order to improve the  following deficits and impairments:  Decreased activity tolerance, Decreased balance, Decreased range of motion, Dizziness, Pain  Visit Diagnosis: Dizziness and giddiness  Cervicalgia  Abnormal posture     Problem List Patient Active Problem List   Diagnosis Date Noted  . Dizziness 09/17/2016  . Headache 09/17/2016  . Post concussion syndrome 05/17/2016  . Vitamin D deficiency 12/20/2013     Lanney Gins, PT, DPT 01/28/17 1:36 PM   [facility] [address]  [place], [place], [address] Phone: [phone]   Fax:  [phone]  Name: Jennifer Manning MRN: [id number] Date of Birth: 1971-07-13

## 2017-02-04 ENCOUNTER — Ambulatory Visit: Payer: BC Managed Care – PPO | Admitting: Physical Therapy

## 2017-02-07 ENCOUNTER — Ambulatory Visit: Payer: BC Managed Care – PPO | Admitting: Physical Therapy

## 2017-02-11 ENCOUNTER — Ambulatory Visit: Payer: BC Managed Care – PPO | Admitting: Physical Therapy

## 2017-02-11 DIAGNOSIS — R42 Dizziness and giddiness: Secondary | ICD-10-CM | POA: Diagnosis not present

## 2017-02-11 DIAGNOSIS — R293 Abnormal posture: Secondary | ICD-10-CM

## 2017-02-11 DIAGNOSIS — M545 Low back pain: Secondary | ICD-10-CM

## 2017-02-11 DIAGNOSIS — M542 Cervicalgia: Secondary | ICD-10-CM

## 2017-02-11 NOTE — Therapy (Signed)
[facility] [address]  [place], [place], [address] Phone: [phone]   Fax:  [phone]  Physical Therapy Treatment  Patient Details  Name: Jennifer Manning MRN: [id number] Date of Birth: 03-16-1971 Referring Provider: Dr. Melvenia Beam  Encounter Date: 02/11/2017      PT End of Session - 02/11/17 1027    Visit Number 25   Date for PT Re-Evaluation 03/25/17   PT Start Time 1020   PT Stop Time 1101   PT Time Calculation (min) 41 min   Activity Tolerance Patient tolerated treatment well   Behavior During Therapy [facility] for tasks assessed/performed      Past Medical History:  Diagnosis Date  . Migraines     Past Surgical History:  Procedure Laterality Date  . TONSILLECTOMY    . TUBAL LIGATION      There were no vitals filed for this visit.      Subjective Assessment - 02/11/17 1023    Subjective Went to MD last week - dx of DDD - spoke of surgery but patient is not agreeable to this   Diagnostic tests Xray and MRI - no diagnostic findings   Patient Stated Goals return to work, reduced headachs and dizziness   Currently in Pain? Yes   Pain Score 3    Pain Location Neck   Pain Orientation Posterior;Right;Left   Pain Descriptors / Indicators Aching;Sore   Multiple Pain Sites Yes   Pain Score 2   Pain Location Back   Pain Orientation Lower   Pain Descriptors / Indicators Aching;Sore                         [facility] Adult PT Treatment/Exercise - 02/11/17 0001      Neck Exercises: Theraband   Shoulder External Rotation 15 reps;Green   Shoulder External Rotation Limitations with scap squeeze   Horizontal ADduction 15 reps;Green   Horizontal ADduction Limitations with scap squeeze     Lumbar Exercises: Aerobic   Stationary Bike NuStep L6 x 6 minutes     Manual Therapy   Manual Therapy Soft tissue mobilization;Passive ROM   Manual therapy comments patient supine   Soft tissue  mobilization STM and manual trigger point release to B UT, B cervical paraspinals, B suboccipital mm   Passive ROM Manual L UT stretch 2 x 60 sec, R cervical rotation 1 x 60 seconds          Trigger Point Dry Needling - 02/11/17 1121    Consent Given? Yes   Muscles Treated Upper Body Upper trapezius  L side only   Upper Trapezius Response Twitch reponse elicited;Palpable increased muscle length                   PT Long Term Goals - 02/11/17 1111      PT LONG TERM GOAL #1   Title Patient to report ability to return to jogging/running for greater than 20 min wihtout increase in pain   Target Date 03/25/17     PT LONG TERM GOAL #3   Title Patient to verbalize and demonstrate proper posture/body mechanics for reduced stress placed on neck/upper back musculature (02/06/17)   Status Achieved     PT LONG TERM GOAL #4   Title Patient to report ability to navigate busy environment with dizziness no greater than 2/10 (01/27/17)   Status Achieved     PT LONG TERM GOAL #5  Title Patient to report dizziness no greater than 1/10 with all head movements and positional changes (7/8//18)   Status Partially Met     PT LONG TERM GOAL #6   Title Patient ot report ability to return to work full time with no increased in nausea or dizziness (01/27/17)   Status Partially Met     PT LONG TERM GOAL #7   Title Patient to demonstrate good core activitation with all static and dynamic activities for reduced low back pain (01/27/17)   Status On-going               Plan - 02/11/17 1114    Clinical Impression Statement Patient today reporting that she has been seen by ortho MD with dx of Lumbar DDD, 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 Patient reporting back pain improvement to level 2/10 with improved functional abilities such as particiaption with exercise and sitting activities. Patient does continue to report cervical pain with tenderness to deep palpation, along  with some continued dizziness/nausea with watching movies and scrolling on her phone. Patient with excellent compliance wiht HEP program. Dry needling recently introduced for low back and cervicla pain with excellent patient repsponse thus far. Will contiue to progress both cervical and low back mobility/pain issues as patient tolerates.    PT Treatment/Interventions ADLs/Self Care Home Management;Electrical Stimulation;Moist Heat;Traction;Ultrasound;Therapeutic exercise;Therapeutic activities;Patient/family education;Manual techniques;Passive range of motion;Vestibular;Visual/perceptual remediation/compensation;Taping;Dry needling   PT Next Visit Plan cervical and low back mobility, pain, dry needling   Consulted and Agree with Plan of Care Patient      Patient will benefit from skilled therapeutic intervention in order to improve the following deficits and impairments:  Decreased activity tolerance, Decreased balance, Decreased range of motion, Dizziness, Pain  Visit Diagnosis: Dizziness and giddiness - Plan: PT plan of care cert/re-cert  Cervicalgia - Plan: PT plan of care cert/re-cert  Abnormal posture - Plan: PT plan of care cert/re-cert  Low back pain, unspecified back pain laterality, unspecified chronicity, with sciatica presence unspecified - Plan: PT plan of care cert/re-cert     Problem List Patient Active Problem List   Diagnosis Date Noted  . Dizziness 09/17/2016  . Headache 09/17/2016  . Post concussion syndrome 05/17/2016  . Vitamin D deficiency 12/20/2013    Lanney Gins, PT, DPT 02/11/17 11:24 AM   [facility] [address]  [place], [place], [address] Phone: [phone]   Fax:  [phone]  Name: Jennifer Manning MRN: [id number] Date of Birth: 05-31-1971

## 2017-02-13 ENCOUNTER — Encounter: Payer: Self-pay | Admitting: Neurology

## 2017-02-13 ENCOUNTER — Ambulatory Visit (INDEPENDENT_AMBULATORY_CARE_PROVIDER_SITE_OTHER): Payer: BC Managed Care – PPO | Admitting: Neurology

## 2017-02-13 VITALS — BP 112/78 | HR 74 | Ht 60.0 in | Wt 121.4 lb

## 2017-02-13 DIAGNOSIS — G43709 Chronic migraine without aura, not intractable, without status migrainosus: Secondary | ICD-10-CM
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
## 2017-02-13 NOTE — Patient Instructions (Addendum)
Remember to drink plenty of fluid, eat healthy meals and do not skip any meals. Try to eat protein with a every meal and eat a healthy snack such as fruit or nuts in between meals. Try to keep a regular sleep-wake schedule and try to exercise daily, particularly in the form of walking, 20-30 minutes a day, if you can.   As far as your medications are concerned, I would like to suggest:  Topiramate, Nortriptyline/Amitriptyline, propranolol Aimovig Triptans for example Sumatriptan  I would like to see you back as needed, sooner if we need to. Please call us with any interim questions, concerns, problems, updates or refill requests.   Our phone number is [phone]. We also have an after hours call service for urgent matters and there is a physician on-call for urgent questions. For any emergencies you know to call 911 or go to the nearest emergency room

## 2017-02-18 ENCOUNTER — Ambulatory Visit: Payer: BC Managed Care – PPO | Admitting: Physical Therapy

## 2017-02-18 DIAGNOSIS — R42 Dizziness and giddiness: Secondary | ICD-10-CM

## 2017-02-18 DIAGNOSIS — R293 Abnormal posture: Secondary | ICD-10-CM

## 2017-02-18 DIAGNOSIS — M545 Low back pain: Secondary | ICD-10-CM

## 2017-02-18 DIAGNOSIS — M542 Cervicalgia: Secondary | ICD-10-CM

## 2017-02-18 NOTE — Therapy (Signed)
[facility] [address]  [place], [place], [address] Phone: [phone]   Fax:  [phone]  Physical Therapy Treatment  Patient Details  Name: Jennifer Manning MRN: [id number] Date of Birth: 03-04-1971 Referring Provider: Dr. Melvenia Beam  Encounter Date: 02/18/2017      PT End of Session - 02/18/17 1021    Visit Number 26   Date for PT Re-Evaluation 03/25/17   PT Start Time 1016   PT Stop Time 1100   PT Time Calculation (min) 44 min   Activity Tolerance Patient tolerated treatment well   Behavior During Therapy [facility] for tasks assessed/performed      Past Medical History:  Diagnosis Date  . Migraines   . Post concussive syndrome     Past Surgical History:  Procedure Laterality Date  . TONSILLECTOMY    . TUBAL LIGATION      There were no vitals filed for this visit.      Subjective Assessment - 02/18/17 1019    Subjective saw neuro MD last week - concerned about visual deficits - thinks it may be a form of migraine; felt looser in the neck/shoulder after DN last session   Diagnostic tests Xray and MRI - no diagnostic findings   Patient Stated Goals return to work, reduced headachs and dizziness   Currently in Pain? Yes   Pain Score 5    Pain Location Back   Pain Orientation Lower   Pain Descriptors / Indicators Aching;Sore                         [facility] Adult PT Treatment/Exercise - 02/18/17 1022      Lumbar Exercises: Stretches   Double Knee to Chest Stretch 2 reps;30 seconds   Prone Mid Back Stretch Limitations ball walkouts - fwd/right/left 3 x 10 sec each     Lumbar Exercises: Aerobic   Stationary Bike L2 x 6 min     Lumbar Exercises: Supine   Other Supine Lumbar Exercises single leg bridge x 10 each leg     Lumbar Exercises: Prone   Other Prone Lumbar Exercises modified plank on chair 3 x 30 sec     Manual Therapy   Manual Therapy Soft tissue mobilization   Manual therapy comments patient prone   Soft tissue mobilization STM to B lumbar paraspinals    Other Manual Therapy review of up/down dog for low back stretching          Trigger Point Dry Needling - 02/18/17 1143    Consent Given? Yes   Muscles Treated Lower Body --  R and L - L3-4, L4-5 multifidi                   PT Long Term Goals - 02/11/17 1111      PT LONG TERM GOAL #1   Title Patient to report ability to return to jogging/running for greater than 20 min wihtout increase in pain   Target Date 03/25/17     PT LONG TERM GOAL #3   Title Patient to verbalize and demonstrate proper posture/body mechanics for reduced stress placed on neck/upper back musculature (02/06/17)   Status Achieved     PT LONG TERM GOAL #4   Title Patient to report ability to navigate busy environment with dizziness no greater than 2/10 (01/27/17)   Status Achieved     PT LONG TERM GOAL #5   Title  Patient to report dizziness no greater than 1/10 with all head movements and positional changes (7/8//18)   Status Partially Met     PT LONG TERM GOAL #6   Title Patient ot report ability to return to work full time with no increased in nausea or dizziness (01/27/17)   Status Partially Met     PT LONG TERM GOAL #7   Title Patient to demonstrate good core activitation with all static and dynamic activities for reduced low back pain (01/27/17)   Status On-going               Plan - 02/18/17 1021    Clinical Impression Statement Patient today reporting some improvement in L UT tightness since DN at last session. Having some increased low back pain today that patient has some difficulty stretching and relieving pain - feels like DN helps with pain with patient and PT agreeing to continued DN treatment at lumbar spine. Patient to continue to progress towards goals.    PT Treatment/Interventions ADLs/Self Care Home Management;Electrical Stimulation;Moist Heat;Traction;Ultrasound;Therapeutic  exercise;Therapeutic activities;Patient/family education;Manual techniques;Passive range of motion;Vestibular;Visual/perceptual remediation/compensation;Taping;Dry needling   PT Next Visit Plan cervical and low back mobility, pain, dry needling   Consulted and Agree with Plan of Care Patient      Patient will benefit from skilled therapeutic intervention in order to improve the following deficits and impairments:  Decreased activity tolerance, Decreased balance, Decreased range of motion, Dizziness, Pain  Visit Diagnosis: Dizziness and giddiness  Cervicalgia  Abnormal posture  Low back pain, unspecified back pain laterality, unspecified chronicity, with sciatica presence unspecified     Problem List Patient Active Problem List   Diagnosis Date Noted  . Dizziness 09/17/2016  . Headache 09/17/2016  . Post concussion syndrome 05/17/2016  . Vitamin D deficiency 12/20/2013     Lanney Gins, PT, DPT 02/18/17 1:11 PM   [facility] [address]  [place], [place], [address] Phone: [phone]   Fax:  [phone]  Name: Jennifer Manning MRN: [id number] Date of Birth: [birth year]

## 2017-02-26 ENCOUNTER — Ambulatory Visit: Payer: BC Managed Care – PPO | Admitting: Physical Therapy

## 2017-02-26 ENCOUNTER — Ambulatory Visit: Payer: BC Managed Care – PPO | Attending: Neurology | Admitting: Physical Therapy

## 2017-02-26 DIAGNOSIS — M545 Low back pain: Secondary | ICD-10-CM

## 2017-02-26 DIAGNOSIS — M542 Cervicalgia: Secondary | ICD-10-CM | POA: Insufficient documentation

## 2017-02-26 DIAGNOSIS — R42 Dizziness and giddiness: Secondary | ICD-10-CM | POA: Insufficient documentation

## 2017-02-26 DIAGNOSIS — R293 Abnormal posture: Secondary | ICD-10-CM | POA: Diagnosis present

## 2017-02-26 NOTE — Therapy (Signed)
[facility] [address]  [place], [place], [address] Phone: [phone]   Fax:  [phone]  Physical Therapy Treatment  Patient Details  Name: Jennifer Manning MRN: [id number] Date of Birth: 1971-04-30 Referring Provider: Dr. Melvenia Beam  Encounter Date: 02/26/2017      PT End of Session - 02/26/17 1751    Visit Number 27   Date for PT Re-Evaluation 03/25/17   PT Start Time 1700   PT Stop Time 1744   PT Time Calculation (min) 44 min   Activity Tolerance Patient tolerated treatment well   Behavior During Therapy [facility] for tasks assessed/performed      Past Medical History:  Diagnosis Date  . Migraines   . Post concussive syndrome     Past Surgical History:  Procedure Laterality Date  . TONSILLECTOMY    . TUBAL LIGATION      There were no vitals filed for this visit.      Subjective Assessment - 02/26/17 1705    Subjective Feels like her back pain has been really good compared to the neck   Diagnostic tests Xray and MRI - no diagnostic findings   Patient Stated Goals return to work, reduced headachs and dizziness   Currently in Pain? Yes   Pain Score 2    Pain Location Neck  1/10 low back   Pain Orientation Posterior   Pain Descriptors / Indicators Aching;Sore;Tightness                         [facility] Adult PT Treatment/Exercise - 02/26/17 0001      Lumbar Exercises: Stretches   Lower Trunk Rotation 5 reps;30 seconds   Lower Trunk Rotation Limitations prone   Quadruped Mid Back Stretch 5 reps   Quadruped Mid Back Stretch Limitations seated EOM - red pball rollouts - fwd/R/L     Lumbar Exercises: Aerobic   Stationary Bike L3 x 6 min     Lumbar Exercises: Prone   Other Prone Lumbar Exercises upward facing dog 3 x 30; down dog 3 x 30;    Other Prone Lumbar Exercises catepillar x 5 reps     Manual Therapy   Manual Therapy Soft tissue mobilization;Joint mobilization   Manual therapy comments patient prone   Joint Mobilization grade II-III CPAs of C-spine   Soft tissue mobilization STM to B UT, B LS, B cervical paraspinals          Trigger Point Dry Needling - 02/26/17 1747    Consent Given? Yes   Muscles Treated Upper Body Longissimus   Longissimus Response Twitch response elicited;Palpable increased muscle length                   PT Long Term Goals - 02/11/17 1111      PT LONG TERM GOAL #1   Title Patient to report ability to return to jogging/running for greater than 20 min wihtout increase in pain   Target Date 03/25/17     PT LONG TERM GOAL #3   Title Patient to verbalize and demonstrate proper posture/body mechanics for reduced stress placed on neck/upper back musculature (02/06/17)   Status Achieved     PT LONG TERM GOAL #4   Title Patient to report ability to navigate busy environment with dizziness no greater than 2/10 (01/27/17)   Status Achieved     PT LONG TERM GOAL #5   Title Patient to report  dizziness no greater than 1/10 with all head movements and positional changes (7/8//18)   Status Partially Met     PT LONG TERM GOAL #6   Title Patient ot report ability to return to work full time with no increased in nausea or dizziness (01/27/17)   Status Partially Met     PT LONG TERM GOAL #7   Title Patient to demonstrate good core activitation with all static and dynamic activities for reduced low back pain (01/27/17)   Status On-going               Plan - 02/26/17 1751    Clinical Impression Statement Patient reporting great improvements in low back pain with DN intervention. PT and patient discussing holding off on DN to low back today to allow for patient to manage low back pain independently. Patient does report neck pain which PT and patient both feel would benefit from DN, with patient noting immediate relief follwoing treatment. Will plan to continue to progress towards goals.    PT Treatment/Interventions  ADLs/Self Care Home Management;Electrical Stimulation;Moist Heat;Traction;Ultrasound;Therapeutic exercise;Therapeutic activities;Patient/family education;Manual techniques;Passive range of motion;Vestibular;Visual/perceptual remediation/compensation;Taping;Dry needling   PT Next Visit Plan cervical and low back mobility, pain, dry needling   Consulted and Agree with Plan of Care Patient      Patient will benefit from skilled therapeutic intervention in order to improve the following deficits and impairments:  Decreased activity tolerance, Decreased balance, Decreased range of motion, Dizziness, Pain  Visit Diagnosis: Dizziness and giddiness  Cervicalgia  Abnormal posture  Low back pain, unspecified back pain laterality, unspecified chronicity, with sciatica presence unspecified     Problem List Patient Active Problem List   Diagnosis Date Noted  . Dizziness 09/17/2016  . Headache 09/17/2016  . Post concussion syndrome 05/17/2016  . Vitamin D deficiency 12/20/2013     Lanney Gins, PT, DPT 02/26/17 5:54 PM   [facility] [address]  [place], [place], [address] Phone: [phone]   Fax:  [phone]  Name: Jennifer Manning MRN: [id number] Date of Birth: 01/23/1971

## 2017-03-04 ENCOUNTER — Ambulatory Visit: Payer: BC Managed Care – PPO | Admitting: Physical Therapy

## 2017-03-04 DIAGNOSIS — M545 Low back pain: Secondary | ICD-10-CM

## 2017-03-04 DIAGNOSIS — R42 Dizziness and giddiness: Secondary | ICD-10-CM

## 2017-03-04 DIAGNOSIS — M542 Cervicalgia: Secondary | ICD-10-CM

## 2017-03-04 DIAGNOSIS — R293 Abnormal posture: Secondary | ICD-10-CM

## 2017-03-04 NOTE — Therapy (Addendum)
[facility] [address]  [place], [place], [address] Phone: [phone]   Fax:  [phone]  Physical Therapy Treatment  Patient Details  Name: Jennifer Manning MRN: [id number] Date of Birth: 09-28-1970 Referring Provider: Dr. Melvenia Beam  Encounter Date: 03/04/2017      PT End of Session - 03/04/17 1657    Visit Number 28   PT Start Time 9024   PT Stop Time 1626   PT Time Calculation (min) 44 min   Activity Tolerance Patient tolerated treatment well   Behavior During Therapy [facility] for tasks assessed/performed      Past Medical History:  Diagnosis Date  . Migraines   . Post concussive syndrome     Past Surgical History:  Procedure Laterality Date  . TONSILLECTOMY    . TUBAL LIGATION      There were no vitals filed for this visit.      Subjective Assessment - 03/04/17 1546    Subjective having some neck and mid back soreness - it may be related to    Diagnostic tests Xray and MRI - no diagnostic findings   Patient Stated Goals return to work, reduced headachs and dizziness   Currently in Pain? Yes   Pain Score 1    Pain Location Neck   Pain Orientation Posterior   Pain Descriptors / Indicators Aching;Sore                         [facility] Adult PT Treatment/Exercise - 03/04/17 1701      Exercises   Exercises --  education/review on HEP, core and hip strengthening     Lumbar Exercises: Stretches   Press Ups 3 reps;20 seconds     Lumbar Exercises: Aerobic   Stationary Bike L3 x 6 min     Lumbar Exercises: Quadruped   [place] 10 reps     Manual Therapy   Manual Therapy Joint mobilization;Soft tissue mobilization   Manual therapy comments patient supine   Joint Mobilization Grade II-III CPAs of cervical spine from approx C3-T1   Soft tissue mobilization STM to B UT, B LS, B cervical paraspinals, B suboccipital mm.   Passive ROM B UT stretch 1 x 1 min each, B  cervical rotation stretch 1 x 1 min each   Other Manual Therapy education on foam rolling for self mobilizations for back, rolling of mid back, as well as glutes and quads                     PT Long Term Goals - 03/04/17 1658      PT LONG TERM GOAL #1   Title Patient to report ability to return to jogging/running for greater than 20 min wihtout increase in pain   Status Partially Met     PT LONG TERM GOAL #3   Title Patient to verbalize and demonstrate proper posture/body mechanics for reduced stress placed on neck/upper back musculature (02/06/17)   Status Achieved     PT LONG TERM GOAL #4   Title Patient to report ability to navigate busy environment with dizziness no greater than 2/10 (01/27/17)   Status Achieved     PT LONG TERM GOAL #5   Title Patient to report dizziness no greater than 1/10 with all head movements and positional changes (7/8//18)   Status Achieved     PT LONG TERM GOAL #6   Title Patient  ot report ability to return to work full time with no increased in nausea or dizziness (01/27/17)   Status Achieved     PT LONG TERM GOAL #7   Title Patient to demonstrate good core activitation with all static and dynamic activities for reduced low back pain (01/27/17)   Status Achieved               Plan - 03/04/17 1658    Clinical Impression Statement Jennifer Manning today doing very well. Patient able to manage and prevent pain with pain levels remaining 1-2/10 with good ability to be aware of pain and how to remedy with stretching and general mobility. Patient and PT discussing today 30 day hold today as patient is able to perform all HEP independently as well as return to light gym program with no issue. Discussion on progression of cardio work to include jogging with good carryover. All goals met or partially met today with jogging goal only remaining goal with good patient education on how to progress independently. Will plan to place patient on 30 day hold at this  time with patient understanding ability to return to PT if warranted.    PT Treatment/Interventions ADLs/Self Care Home Management;Electrical Stimulation;Moist Heat;Traction;Ultrasound;Therapeutic exercise;Therapeutic activities;Patient/family education;Manual techniques;Passive range of motion;Vestibular;Visual/perceptual remediation/compensation;Taping;Dry needling   PT Next Visit Plan 30 day hold   Consulted and Agree with Plan of Care Patient      Patient will benefit from skilled therapeutic intervention in order to improve the following deficits and impairments:  Decreased activity tolerance, Decreased balance, Decreased range of motion, Dizziness, Pain  Visit Diagnosis: Dizziness and giddiness  Cervicalgia  Abnormal posture  Low back pain, unspecified back pain laterality, unspecified chronicity, with sciatica presence unspecified     Problem List Patient Active Problem List   Diagnosis Date Noted  . Dizziness 09/17/2016  . Headache 09/17/2016  . Post concussion syndrome 05/17/2016  . Vitamin D deficiency 12/20/2013     Lanney Gins, PT, DPT 03/04/17 5:06 PM   PHYSICAL THERAPY DISCHARGE SUMMARY  Visits from Start of Care: 28  Current functional level related to goals / functional outcomes: See above   Remaining deficits: See above   Education / Equipment: HEP  Plan: Patient agrees to discharge.  Patient goals were partially met. Patient is being discharged due to being pleased with the current functional level.  ?????    Lanney Gins, PT, DPT 04/09/17 2:16 PM    [facility] [address]  [place], [place], [address] Phone: [phone]   Fax:  [phone]  Name: Jennifer Manning MRN: [id number] Date of Birth: 09/20/1970

## 2017-09-04 LAB — HEPATIC FUNCTION PANEL
ALK PHOS: 54 (ref 25–125)
ALT: 16 (ref 7–35)
AST: 13 (ref 13–35)
Bilirubin, Total: 0.6

## 2017-09-04 LAB — BASIC METABOLIC PANEL
BUN: 8 (ref 4–21)
CREATININE: 0.7 (ref 0.5–1.1)
Glucose: 78
Potassium: 4.5 (ref 3.4–5.3)
Sodium: 140 (ref 137–147)

## 2017-09-04 LAB — TSH: TSH: 2.13 (ref 0.41–5.90)

## 2017-09-04 LAB — CBC AND DIFFERENTIAL
HEMATOCRIT: 38 (ref 36–46)
Hemoglobin: 12.2 (ref 12.0–16.0)
Platelets: 365 (ref 150–399)
WBC: 4.3

## 2017-10-30 ENCOUNTER — Telehealth: Payer: Self-pay | Admitting: *Deleted

## 2017-10-30 ENCOUNTER — Ambulatory Visit: Payer: BC Managed Care – PPO | Admitting: Neurology

## 2017-10-30 ENCOUNTER — Encounter: Payer: Self-pay | Admitting: Neurology

## 2017-10-30 VITALS — BP 106/63 | HR 70 | Ht 60.0 in | Wt 123.6 lb

## 2017-10-30 DIAGNOSIS — R413 Other amnesia: Secondary | ICD-10-CM

## 2017-10-30 DIAGNOSIS — H832X9 Labyrinthine dysfunction, unspecified ear: Secondary | ICD-10-CM

## 2017-10-30 DIAGNOSIS — R42 Dizziness and giddiness: Secondary | ICD-10-CM | POA: Diagnosis not present

## 2017-10-30 MED ORDER — ONDANSETRON 4 MG PO TBDP: 4.0000 mg | ORAL_TABLET | Freq: Three times a day (TID) | ORAL | 3 refills | Status: DC | PRN

## 2017-10-30 MED ORDER — MECLIZINE HCL 25 MG PO TABS: 25.0000 mg | ORAL_TABLET | Freq: Three times a day (TID) | ORAL | 0 refills | Status: DC | PRN

## 2017-10-30 NOTE — Patient Instructions (Addendum)
Dizziness and vestibular symptoms: Vestibular therapy. Meclizine and zofrn prn Vestibular therapy  Meclizine tablets or capsules What is this medicine? MECLIZINE (MEK li zeen) is an antihistamine. It is used to prevent nausea, vomiting, or dizziness caused by motion sickness. It is also used to prevent and treat vertigo (extreme dizziness or a feeling that you or your surroundings are tilting or spinning around). This medicine may be used for other purposes; ask your health care provider or pharmacist if you have questions. COMMON BRAND NAME(S): Antivert, Dramamine Less Drowsy, Medivert, Meni-D What should I tell my health care provider before I take this medicine? They need to know if you have any of these conditions: -glaucoma -lung or breathing disease, like asthma -problems urinating -prostate disease -stomach or intestine problems -an unusual or allergic reaction to meclizine, other medicines, foods, dyes, or preservatives -pregnant or trying to get pregnant -breast-feeding How should I use this medicine? Take this medicine by mouth with a glass of water. Follow the directions on the prescription label. If you are using this medicine to prevent motion sickness, take the dose at least 1 hour before travel. If it upsets your stomach, take it with food or milk. Take your doses at regular intervals. Do not take your medicine more often than directed. Talk to your pediatrician regarding the use of this medicine in children. Special care may be needed. Overdosage: If you think you have taken too much of this medicine contact a poison control center or emergency room at once. NOTE: This medicine is only for you. Do not share this medicine with others. What if I miss a dose? If you miss a dose, take it as soon as you can. If it is almost time for your next dose, take only that dose. Do not take double or extra doses. What may interact with this medicine? Do not take this medicine with any of  the following medications: -MAOIs like Carbex, Eldepryl, Marplan, Nardil, and Parnate This medicine may also interact with the following medications: -alcohol -antihistamines for allergy, cough and cold -certain medicines for anxiety or sleep -certain medicines for depression, like amitriptyline, fluoxetine, sertraline -certain medicines for seizures like phenobarbital, primidone -general anesthetics like halothane, isoflurane, methoxyflurane, propofol -local anesthetics like lidocaine, pramoxine, tetracaine -medicines that relax muscles for surgery -narcotic medicines for pain -phenothiazines like chlorpromazine, mesoridazine, prochlorperazine, thioridazine This list may not describe all possible interactions. Give your health care provider a list of all the medicines, herbs, non-prescription drugs, or dietary supplements you use. Also tell them if you smoke, drink alcohol, or use illegal drugs. Some items may interact with your medicine. What should I watch for while using this medicine? Tell your doctor or healthcare professional if your symptoms do not start to get better or if they get worse. You may get drowsy or dizzy. Do not drive, use machinery, or do anything that needs mental alertness until you know how this medicine affects you. Do not stand or sit up quickly, especially if you are an older patient. This reduces the risk of dizzy or fainting spells. Alcohol may interfere with the effect of this medicine. Avoid alcoholic drinks. Your mouth may get dry. Chewing sugarless gum or sucking hard candy, and drinking plenty of water may help. Contact your doctor if the problem does not go away or is severe. This medicine may cause dry eyes and blurred vision. If you wear contact lenses you may feel some discomfort. Lubricating drops may help. See your eye doctor if the  problem does not go away or is severe. What side effects may I notice from receiving this medicine? Side effects that you  should report to your doctor or health care professional as soon as possible: -feeling faint or lightheaded, falls -fast, irregular heartbeat Side effects that usually do not require medical attention (report to your doctor or health care professional if they continue or are bothersome): -constipation -headache -trouble passing urine or change in the amount of urine -trouble sleeping -upset stomach This list may not describe all possible side effects. Call your doctor for medical advice about side effects. You may report side effects to FDA at 1-800-FDA-1088. Where should I keep my medicine? Keep out of the reach of children. Store at room temperature between 15 and 30 degrees C (59 and 86 degrees F). Keep container tightly closed. Throw away any unused medicine after the expiration date. NOTE: This sheet is a summary. It may not cover all possible information. If you have questions about this medicine, talk to your doctor, pharmacist, or health care provider.  2018 Elsevier/Gold Standard (2015-08-10 19:41:02)  Ondansetron tablets What is this medicine? ONDANSETRON (on DAN se tron) is used to treat nausea and vomiting caused by chemotherapy. It is also used to prevent or treat nausea and vomiting after surgery. This medicine may be used for other purposes; ask your health care provider or pharmacist if you have questions. COMMON BRAND NAME(S): Zofran What should I tell my health care provider before I take this medicine? They need to know if you have any of these conditions: -heart disease -history of irregular heartbeat -liver disease -low levels of magnesium or potassium in the blood -an unusual or allergic reaction to ondansetron, granisetron, other medicines, foods, dyes, or preservatives -pregnant or trying to get pregnant -breast-feeding How should I use this medicine? Take this medicine by mouth with a glass of water. Follow the directions on your prescription label. Take your  doses at regular intervals. Do not take your medicine more often than directed. Talk to your pediatrician regarding the use of this medicine in children. Special care may be needed. Overdosage: If you think you have taken too much of this medicine contact a poison control center or emergency room at once. NOTE: This medicine is only for you. Do not share this medicine with others. What if I miss a dose? If you miss a dose, take it as soon as you can. If it is almost time for your next dose, take only that dose. Do not take double or extra doses. What may interact with this medicine? Do not take this medicine with any of the following medications: -apomorphine -certain medicines for fungal infections like fluconazole, itraconazole, ketoconazole, posaconazole, voriconazole -cisapride -dofetilide -dronedarone -pimozide -thioridazine -ziprasidone This medicine may also interact with the following medications: -carbamazepine -certain medicines for depression, anxiety, or psychotic disturbances -fentanyl -linezolid -MAOIs like Carbex, Eldepryl, Marplan, Nardil, and Parnate -methylene blue (injected into a vein) -other medicines that prolong the QT interval (cause an abnormal heart rhythm) -phenytoin -rifampicin -tramadol This list may not describe all possible interactions. Give your health care provider a list of all the medicines, herbs, non-prescription drugs, or dietary supplements you use. Also tell them if you smoke, drink alcohol, or use illegal drugs. Some items may interact with your medicine. What should I watch for while using this medicine? Check with your doctor or health care professional right away if you have any sign of an allergic reaction. What side effects may I notice  from receiving this medicine? Side effects that you should report to your doctor or health care professional as soon as possible: -allergic reactions like skin rash, itching or hives, swelling of the face,  lips or tongue -breathing problems -confusion -dizziness -fast or irregular heartbeat -feeling faint or lightheaded, falls -fever and chills -loss of balance or coordination -seizures -sweating -swelling of the hands or feet -tightness in the chest -tremors -unusually weak or tired Side effects that usually do not require medical attention (report to your doctor or health care professional if they continue or are bothersome): -constipation or diarrhea -headache This list may not describe all possible side effects. Call your doctor for medical advice about side effects. You may report side effects to FDA at 1-800-FDA-1088. Where should I keep my medicine? Keep out of the reach of children. Store between 2 and 30 degrees C (36 and 86 degrees F). Throw away any unused medicine after the expiration date. NOTE: This sheet is a summary. It may not cover all possible information. If you have questions about this medicine, talk to your doctor, pharmacist, or health care provider.  2018 Elsevier/Gold Standard (2013-04-15 16:27:45)

## 2017-10-30 NOTE — Telephone Encounter (Signed)
Patient has requested a letter for her work with the following content: continue to have limited use of screens as it can cause dizziness, nausea, headache.   She authorized for the letter to be mailed to her.
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
## 2017-11-03 LAB — METHYLMALONIC ACID, SERUM: Methylmalonic Acid: 82 nmol/L (ref 0–378)

## 2017-11-03 LAB — VITAMIN B1: Thiamine: 85.7 nmol/L (ref 66.5–200.0)

## 2017-11-03 LAB — TSH: TSH: 1.91 u[IU]/mL (ref 0.450–4.500)

## 2017-11-03 LAB — B12 AND FOLATE PANEL
Folate: 17.7 ng/mL (ref >3.0)
VITAMIN B 12: 469 pg/mL (ref 232–1245)

## 2017-11-03 LAB — VITAMIN B6: Vitamin B6: 11.7 ug/L (ref 2.0–32.8)

## 2017-11-04 ENCOUNTER — Telehealth: Payer: Self-pay | Admitting: Neurology

## 2017-11-04 ENCOUNTER — Encounter: Payer: Self-pay | Admitting: *Deleted

## 2017-11-04 NOTE — Telephone Encounter (Signed)
Called the pt to review lab work. No answer. Mailbox was full.  If patient calls please make aware that labs were normal and there were no concerns.

## 2017-11-04 NOTE — Telephone Encounter (Signed)
Letter written per Dr. Jaynee Eagles. Ready to be mailed to pt after MD signature.

## 2017-11-04 NOTE — Telephone Encounter (Signed)
Advised patient of normal labs per previous message.

## 2017-11-04 NOTE — Telephone Encounter (Signed)
-----   Message from Melvenia Beam, MD sent at 11/03/2017  8:16 PM EDT ----- Labs all normal thanks

## 2017-11-04 NOTE — Telephone Encounter (Signed)
Letter signed and placed in addressed envelope. Sent to medical records to be mailed.

## 2017-11-04 NOTE — Telephone Encounter (Signed)
Please write up letter as follows thanks:  Please allow frequent breaks due to sensitivity to computer screen brightness and motion. Please allow 10 minutes every hour for breaks from the computer screen if symptomatic.

## 2017-11-05 ENCOUNTER — Encounter (INDEPENDENT_AMBULATORY_CARE_PROVIDER_SITE_OTHER): Payer: Self-pay

## 2017-12-25 ENCOUNTER — Ambulatory Visit
Admission: RE | Admit: 2017-12-25 | Discharge: 2017-12-25 | Disposition: A | Discharge status: Home / Self Care | Payer: BC Managed Care – PPO | Source: Ambulatory Visit | Attending: Internal Medicine | Admitting: Internal Medicine

## 2017-12-25 ENCOUNTER — Other Ambulatory Visit: Payer: Self-pay | Admitting: Internal Medicine

## 2017-12-25 DIAGNOSIS — R05 Cough: Secondary | ICD-10-CM

## 2017-12-25 DIAGNOSIS — R059 Cough, unspecified: Secondary | ICD-10-CM

## 2017-12-27 ENCOUNTER — Other Ambulatory Visit: Payer: Self-pay

## 2017-12-27 ENCOUNTER — Ambulatory Visit: Payer: BC Managed Care – PPO | Attending: Neurology

## 2017-12-27 DIAGNOSIS — R2689 Other abnormalities of gait and mobility: Secondary | ICD-10-CM | POA: Diagnosis present

## 2017-12-27 DIAGNOSIS — R42 Dizziness and giddiness: Secondary | ICD-10-CM | POA: Diagnosis not present

## 2017-12-27 DIAGNOSIS — M542 Cervicalgia: Secondary | ICD-10-CM

## 2017-12-27 NOTE — Patient Instructions (Signed)
Gaze Stabilization: Tip Card  1.Target must remain in focus, not blurry, and appear stationary while head is in motion. 2.Perform exercises with small head movements (45 to either side of midline). 3.Increase speed of head motion so long as target is in focus. 4.If you wear eyeglasses, be sure you can see target through lens (therapist will give specific instructions for bifocal / progressive lenses). 5.These exercises may provoke dizziness or nausea. Work through these symptoms. If too dizzy, slow head movement slightly. Rest between each exercise. 6.Exercises demand concentration; avoid distractions.  Copyright  VHI. All rights reserved.   Gaze Stabilization: Sitting    Keeping eyes on target on wall 3-5 feet away, tilt head down 15-30 and move head side to side for _20-30___ seconds. Repeat while moving head up and down for __20-30__ seconds. Do __1-2__ sessions per day.  Copyright  VHI. All rights reserved.
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
## 2018-01-09 ENCOUNTER — Encounter: Payer: Self-pay | Admitting: Physical Therapy

## 2018-01-09 ENCOUNTER — Ambulatory Visit: Payer: BC Managed Care – PPO | Admitting: Physical Therapy

## 2018-01-09 DIAGNOSIS — R42 Dizziness and giddiness: Secondary | ICD-10-CM | POA: Diagnosis not present

## 2018-01-09 NOTE — Patient Instructions (Addendum)
Gaze Stabilization: Tip Card  1.Target must remain in focus, not blurry, and appear stationary while head is in motion. 2.Perform exercises with small head movements (45 to either side of midline). 3.Increase speed of head motion so long as target is in focus. 4.If you wear eyeglasses, be sure you can see target through lens (therapist will give specific instructions for bifocal / progressive lenses). 5.These exercises may provoke dizziness or nausea. Work through these symptoms. If too dizzy (symptoms more than 5 out of 10), slow head movement slightly. Rest between each exercise. 6.Exercises demand concentration; avoid distractions.  Copyright  VHI. All rights reserved.     Special Instructions: Exercises may bring on mild to moderate symptoms of dizziness/headache that resolve within 30 minutes of completing exercises. If symptoms are lasting longer than 30 minutes, modify your exercises by:  >decreasing the # of times you complete each activity >ensuring your symptoms return to baseline before moving onto the next exercise >dividing up exercises so you do not do them all in one session, but multiple short sessions throughout the day >doing them once a day until symptoms improve    For safety, perform standing exercises close to a counter, wall, corner, or next to someone.   Gaze Stabilization - Standing Feet Apart   Feet togethert, hand target on CLOSED blinds, keeping eyes on target on wall 3 feet away, tilt head down slightly and move head side to side for 30 seconds. Rest and repeat twice more for 30-60 seconds. Repeat while moving head up and down for 30 seconds. Rest and repeat twice more for 30-60 seconds. *Work up to tolerating 60 seconds, as able. Do 2-3 sessions per day.   Copyright  VHI. All rights reserved.
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
## 2018-01-17 ENCOUNTER — Ambulatory Visit: Payer: BC Managed Care – PPO

## 2018-01-20 ENCOUNTER — Ambulatory Visit: Payer: BC Managed Care – PPO | Attending: Neurology | Admitting: Rehabilitative and Restorative Service Providers"

## 2018-01-20 ENCOUNTER — Encounter: Payer: Self-pay | Admitting: Rehabilitative and Restorative Service Providers"

## 2018-01-20 DIAGNOSIS — R2689 Other abnormalities of gait and mobility: Secondary | ICD-10-CM

## 2018-01-20 DIAGNOSIS — R293 Abnormal posture: Secondary | ICD-10-CM | POA: Insufficient documentation

## 2018-01-20 DIAGNOSIS — M542 Cervicalgia: Secondary | ICD-10-CM | POA: Diagnosis present

## 2018-01-20 DIAGNOSIS — R42 Dizziness and giddiness: Secondary | ICD-10-CM | POA: Diagnosis not present
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
## 2018-01-20 NOTE — Patient Instructions (Signed)
Gaze Stabilization: Tip Card  1.Target must remain in focus, not blurry, and appear stationary while head is in motion. 2.Perform exercises with small head movements (45 to either side of midline). 3.Increase speed of head motion so long as target is in focus. 4.If you wear eyeglasses, be sure you can see target through lens (therapist will give specific instructions for bifocal / progressive lenses). 5.These exercises may provoke dizziness or nausea. Work through these symptoms. If too dizzy (symptoms more than 5 out of 10), slow head movement slightly. Rest between each exercise. 6.Exercises demand concentration; avoid distractions.  Copyright  VHI. All rights reserved.     Special Instructions: Exercises may bring on mild to moderate symptoms of dizziness/headache that resolve within 30 minutes of completing exercises. If symptoms are lasting longer than 30 minutes, modify your exercises by:  >decreasing the # of times you complete each activity >ensuring your symptoms return to baseline before moving onto the next exercise >dividing up exercises so you do not do them all in one session, but multiple short sessions throughout the day >doing them once a day until symptoms improve    For safety, perform standing exercises close to a counter, wall, corner, or next to someone.   Gaze Stabilization - Standing Feet Apart   Feet togethert, hand target on CLOSED blinds, keeping eyes on target on wall 3 feet away, tilt head down slightly and move head side to side for 30 seconds. Rest and repeat twice more for 30-60 seconds. Repeat while moving head up and down for 30 seconds. Rest and repeat twice more for 30-60 seconds. *Work up to tolerating 60 seconds, as able. Do 2-3 sessions per day.   Copyright  VHI. All rights reserved.   Feet Apart (Compliant Surface) Head Motion - Eyes Open    With eyes open, standing on compliant surface: ___pillow_____, feet shoulder width  apart, move head slowly: up and down 5 times.  Rest, then move head side to side x 5 times.  Do __2__ sessions per day.  Copyright  VHI. All rights reserved.   Turning in Place: Solid Surface    Standing in place, lead with head and turn slowly making a full turn to the right.  Repeat 2 times resting in between.  Then perform 2 turns toward left.  Rest in between to allow symptoms to settle. Do 2 times/day.  You can increase from 2 times up to 5 times slowly (every 2-3 days you can add an extra repetition) Copyright  VHI. All rights reserved.

## 2018-01-22 ENCOUNTER — Encounter: Payer: Self-pay | Admitting: Rehabilitative and Restorative Service Providers"

## 2018-01-22 ENCOUNTER — Ambulatory Visit: Payer: BC Managed Care – PPO | Admitting: Rehabilitative and Restorative Service Providers"

## 2018-01-22 DIAGNOSIS — R293 Abnormal posture: Secondary | ICD-10-CM

## 2018-01-22 DIAGNOSIS — R2689 Other abnormalities of gait and mobility: Secondary | ICD-10-CM

## 2018-01-22 DIAGNOSIS — R42 Dizziness and giddiness: Secondary | ICD-10-CM

## 2018-01-22 NOTE — Patient Instructions (Signed)
Gaze Stabilization: Tip Card  1.Target must remain in focus, not blurry, and appear stationary while head is in motion. 2.Perform exercises with small head movements (45 to either side of midline). 3.Increase speed of head motion so long as target is in focus. 4.If you wear eyeglasses, be sure you can see target through lens (therapist will give specific instructions for bifocal / progressive lenses). 5.These exercises may provoke dizziness or nausea. Work through these symptoms. If too dizzy(symptoms more than 5 out of 10), slow head movement slightly. Rest between each exercise. 6.Exercises demand concentration; avoid distractions.  Copyright  VHI. All rights reserved.    Special Instructions: Exercises may bring on mild to moderate symptoms ofdizziness/headachethat resolve within 30 minutes of completing exercises. If symptoms are lasting longer than 30 minutes, modify your exercises by: >decreasing the # of times you complete each activity >ensuring your symptoms return to baseline before moving onto the next exercise >dividing up exercises so you do not do them all in one session, but multiple short sessions throughout the day >doing them once a day until symptoms improve    For safety, perform standing exercises close to a counter, wall, corner, or next to someone.   Gaze Stabilization - Standing Feet Apart   Feettogethert,hand target on CLOSED blinds,keeping eyes on target on wall 3 feet away, tilt head down slightly and move head side to side for 30 seconds.Rest and repeat twice more for 30-60 seconds.Repeat while moving head up and down for 30 seconds.Rest and repeat twice more for 30-60 seconds.*Work up to tolerating 60 seconds, as able. Do 2-3 sessions per day.   Copyright  VHI. All rights reserved.  Feet Apart (Compliant Surface) Head Motion - Eyes Open    With eyes open, standing on compliant surface: ___pillow_____, feet shoulder width  apart, move head slowly: up and down 5 times.  Rest, then move head side to side x 5 times.  Do __2__ sessions per day.  Copyright  VHI. All rights reserved.    FOR MOTION SENSITIVITY:  These exercises should bring on mild to moderate symptoms that resolve within minutes of finishing.  You can modify by decreasing repetitions or only doing once a day if it is provoking headaches.    Turning in Place: Solid Surface    Standing in place, lead with head and turn slowly making a full turn to the right.  Repeat 2 times resting in between.  Then perform 2 turns toward left.  Rest in between to allow symptoms to settle. Do 2 times/day.  You can increase from 2 times up to 5 times slowly (every 2-3 days you can add an extra repetition) Copyright  VHI. All rights reserved.            Electronically signed by Mervyn Gay, PT at 01/20/2018 1:59 PM    Bending / Picking Up Objects    From standing, slowly bend head down and pick up object on the floor. KEEP YOUR EYES OPEN, and LET YOUR EYES MOVE WITH YOUR HEAD (don't spot in front of you).  Return to upright position. Hold position until symptoms subside. Repeat _5___ times per session. Do __2__ sessions per day.  Copyright  VHI. All rights reserved.   Diagonals    Standing reach down towards your right foot, then up/ looking over your left shoulder. Make sure to look towards your foot and then over your shoulder.Rest until symptoms subside. Repeat __5__ times per session. Do __2__ sessions per day.  REST IN BETWEEN, repeat to both sides.  Copyright  VHI. All rights reserved.
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
## 2018-02-06 ENCOUNTER — Ambulatory Visit: Payer: BC Managed Care – PPO

## 2018-02-20 ENCOUNTER — Ambulatory Visit: Payer: BC Managed Care – PPO | Attending: Neurology | Admitting: Rehabilitative and Restorative Service Providers"

## 2018-02-20 ENCOUNTER — Encounter: Payer: Self-pay | Admitting: Rehabilitative and Restorative Service Providers"

## 2018-02-20 DIAGNOSIS — R2689 Other abnormalities of gait and mobility: Secondary | ICD-10-CM | POA: Diagnosis present

## 2018-02-20 DIAGNOSIS — R42 Dizziness and giddiness: Secondary | ICD-10-CM | POA: Diagnosis not present

## 2018-02-20 DIAGNOSIS — R293 Abnormal posture: Secondary | ICD-10-CM | POA: Diagnosis present
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
## 2018-02-20 NOTE — Patient Instructions (Addendum)
Gaze Stabilization: Tip Card  1.Target must remain in focus, not blurry, and appear stationary while head is in motion. 2.Perform exercises with small head movements (45 to either side of midline). 3.Increase speed of head motion so long as target is in focus. 4.If you wear eyeglasses, be sure you can see target through lens (therapist will give specific instructions for bifocal / progressive lenses). 5.These exercises may provoke dizziness or nausea. Work through these symptoms. If too dizzy(symptoms more than 5 out of 10), slow head movement slightly. Rest between each exercise. 6.Exercises demand concentration; avoid distractions.  Copyright  VHI. All rights reserved.    Special Instructions: Exercises may bring on mild to moderate symptoms ofdizziness/headachethat resolve within 30 minutes of completing exercises. If symptoms are lasting longer than 30 minutes, modify your exercises by: >decreasing the # of times you complete each activity >ensuring your symptoms return to baseline before moving onto the next exercise >dividing up exercises so you do not do them all in one session, but multiple short sessions throughout the day >doing them once a day until symptoms improve    For safety, perform standing exercises close to a counter, wall, corner, or next to someone.   Gaze Stabilization - Standing Feet Apart   Feettogethert,hang target on CLOSED blinds,keeping eyes on target on wall 5 feet away, tilt head down slightly and move head side to side for 30 seconds.Rest and repeat twice more for 30-60 seconds.Repeat while moving head up and down for 30 seconds.Rest and repeat twice more for 30-60 seconds.*Work up to tolerating 60 seconds, as able. Do 2-3 sessions per day.   Copyright  VHI. All rights reserved.  Feet Apart (Compliant Surface) Head Motion - Eyes Closed    With eyes closed, standing on compliant surface: ___pillow_____, feet shoulder width  apart, move head slowly: up and down5 times. Rest, then move head side to side x 5 times. Do __2__ sessions per day.  Copyright  VHI. All rights reserved.   FOR MOTION SENSITIVITY:  These exercises should bring on mild to moderate symptoms that resolve within minutes of finishing.  You can modify by decreasing repetitions or only doing once a day if it is provoking headaches.    Turning in Place: Solid Surface    Standing in place, lead with head and turn slowly makinga full turn to the right. Repeat 2 times resting in between. Then perform 2 turnstoward left. Rest in between to allow symptoms to settle. Do 2 times/day. You can increase from 2 times up to 5 times slowly (every 2-3 days you can add an extra repetition) Copyright  VHI. All rights reserved. Vary which way you begin turns each time (start with movement to right one day and then left the next day).          Carpeted Surface With Side to Side Head Motion    Perform without assistive device. Turn head and eyes left for __2__ steps, then right for 2 steps. Repeat sequence __10__ times per session.  Begin in a household setting with less visual feedback.  As this improves, try in a grocery store for short distances.    Bring on mild symptoms and then let your system rest.    Copyright  VHI. All rights reserved.    Electronically signed by Mervyn Gay, PT at 01/20/2018 1:59 PM

## 2018-03-21 ENCOUNTER — Ambulatory Visit: Payer: BC Managed Care – PPO | Admitting: Rehabilitative and Restorative Service Providers"

## 2018-03-21 ENCOUNTER — Encounter: Payer: Self-pay | Admitting: Rehabilitative and Restorative Service Providers"

## 2018-03-21 DIAGNOSIS — R2689 Other abnormalities of gait and mobility: Secondary | ICD-10-CM

## 2018-03-21 DIAGNOSIS — R42 Dizziness and giddiness: Secondary | ICD-10-CM | POA: Diagnosis not present

## 2018-03-21 NOTE — Patient Instructions (Signed)
Gaze Stabilization: Tip Card  1.Target must remain in focus, not blurry, and appear stationary while head is in motion. 2.Perform exercises with small head movements (45 to either side of midline). 3.Increase speed of head motion so long as target is in focus. 4.If you wear eyeglasses, be sure you can see target through lens (therapist will give specific instructions for bifocal / progressive lenses). 5.These exercises may provoke dizziness or nausea. Work through these symptoms. If too dizzy(symptoms more than 5 out of 10), slow head movement slightly. Rest between each exercise. 6.Exercises demand concentration; avoid distractions.  Copyright  VHI. All rights reserved.    Special Instructions: Exercises may bring on mild to moderate symptoms ofdizziness/headachethat resolve within 30 minutes of completing exercises. If symptoms are lasting longer than 30 minutes, modify your exercises by: >decreasing the # of times you complete each activity >ensuring your symptoms return to baseline before moving onto the next exercise >dividing up exercises so you do not do them all in one session, but multiple short sessions throughout the day >doing them once a day until symptoms improve    Gaze Stabilization - Standing Feet Apart    FOR MOTION SENSITIVITY:  These exercises should bring on mild to moderate symptoms that resolve within minutes of finishing. You can modify by decreasing repetitions or only doing once a day if it is provoking headaches.    Turning in Place: Solid Surface    Standing in place, lead with head and turn slowly makinga full turn to the right. Repeat 2 times resting in between. Then perform 2 turnstoward left. Rest in between to allow symptoms to settle. Do 2 times/day. You can increase from 2 times up to 5 times slowly (every 2-3 days you can add an extra repetition) Copyright  VHI. All rights reserved. Vary which way you begin turns  each time (start with movement to right one day and then left the next day).
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
## 2018-03-22 ENCOUNTER — Other Ambulatory Visit: Payer: Self-pay | Admitting: Neurology

## 2018-04-20 ENCOUNTER — Encounter: Payer: Self-pay | Admitting: Internal Medicine

## 2018-04-20 DIAGNOSIS — M542 Cervicalgia: Secondary | ICD-10-CM | POA: Insufficient documentation
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
## 2018-05-08 ENCOUNTER — Ambulatory Visit (INDEPENDENT_AMBULATORY_CARE_PROVIDER_SITE_OTHER): Payer: BC Managed Care – PPO | Admitting: Internal Medicine

## 2018-05-08 ENCOUNTER — Encounter: Payer: Self-pay | Admitting: Internal Medicine

## 2018-05-08 VITALS — BP 108/64 | HR 63 | Temp 98.6°F | Wt 122.0 lb

## 2018-05-08 DIAGNOSIS — M542 Cervicalgia: Secondary | ICD-10-CM

## 2018-05-08 DIAGNOSIS — M545 Low back pain: Secondary | ICD-10-CM

## 2018-05-08 DIAGNOSIS — Z0000 Encounter for general adult medical examination without abnormal findings: Secondary | ICD-10-CM

## 2018-05-08 DIAGNOSIS — G43909 Migraine, unspecified, not intractable, without status migrainosus: Secondary | ICD-10-CM

## 2018-05-08 DIAGNOSIS — G8929 Other chronic pain: Secondary | ICD-10-CM

## 2018-05-08 LAB — POCT URINALYSIS DIPSTICK
Bilirubin, UA: NEGATIVE
Blood, UA: NEGATIVE
Glucose, UA: NEGATIVE
Ketones, UA: NEGATIVE
Leukocytes, UA: NEGATIVE
NITRITE UA: NEGATIVE
PH UA: 6.5 (ref 5.0–8.0)
PROTEIN UA: NEGATIVE
Spec Grav, UA: 1.02 (ref 1.010–1.025)
UROBILINOGEN UA: 0.2 U/dL

## 2018-05-08 MED ORDER — RIZATRIPTAN BENZOATE 5 MG PO TABS: 5.0000 mg | ORAL_TABLET | Freq: Once | ORAL | 2 refills | Status: DC | PRN

## 2018-05-08 NOTE — Progress Notes (Signed)
Subjective:     Patient ID: Jennifer Manning , female    DOB: [birth year] , 47 y.o.   MRN: [id number]   SHE IS HERE TODAY FOR A FULL PHYSICAL EXAM. SHE HAS HER PAP SMEARS PERFORMED BY DR. Cletis Media.     Past Medical History:  Diagnosis Date  . Migraines   . Post concussive syndrome      Last LMP was 04/27/2018.  Negative for: breast discharge, breast lump(s), breast pain and breast self exam. Associated symptoms include abnormal vaginal bleeding. Pertinent negatives include abnormal bleeding (hematology), anxiety, decreased libido, depression, difficulty falling sleep, dyspareunia, history of infertility, nocturia, sexual dysfunction, sleep disturbances, urinary incontinence, urinary urgency, vaginal discharge and vaginal itching. Diet regular.The patient states her exercise level is    . The patient's tobacco use is:  Social History   Tobacco Use  Smoking Status Never Smoker  Smokeless Tobacco Never Used  . She has been exposed to passive smoke. The patient's alcohol use is:  Social History   Substance and Sexual Activity  Alcohol Use No  . Additional information: Last pap August 2019 with Dr. Cletis Media. Mammogram performed same day.   Current Outpatient Medications:  .  albuterol (PROAIR HFA) 108 (90 Base) MCG/ACT inhaler, Inhale into the lungs every 6 (six) hours as needed for wheezing or shortness of breath., Disp: , Rfl:  .  aspirin-acetaminophen-caffeine (EXCEDRIN MIGRAINE) 250-250-65 MG per tablet, Take 1-2 tablets by mouth every 6 (six) hours as needed for headache., Disp: , Rfl:  .  budesonide-formoterol (SYMBICORT) 80-4.5 MCG/ACT inhaler, Inhale 2 puffs into the lungs 2 (two) times daily., Disp: , Rfl:  .  cetirizine (ZYRTEC) 10 MG tablet, Take 10 mg by mouth daily., Disp: , Rfl:  .  Cholecalciferol (VITAMIN D PO), Take 1 tablet by mouth daily., Disp: , Rfl:  .  cyclobenzaprine (FLEXERIL) 5 MG tablet, TAKE 1 TABLET (5 MG TOTAL) BY MOUTH AT BEDTIME., Disp: 30 tablet, Rfl: 1 .   magnesium oxide (MAG-OX) 400 (241.3 Mg) MG tablet, magnesium oxide 400 mg tablet  TAKE 1 TABLET WITH DAILY WITH EVENING MEAL, Disp: , Rfl:  .  meclizine (ANTIVERT) 25 MG tablet, Take 1 tablet (25 mg total) by mouth 3 (three) times daily as needed for dizziness., Disp: 30 tablet, Rfl: 0 .  naproxen sodium (ANAPROX) 550 MG tablet, Take 550 mg by mouth 2 (two) times daily as needed for pain., Disp: , Rfl:  .  Omega-3 1000 MG CAPS, Take 1,000 mg by mouth daily., Disp: , Rfl:  .  ondansetron (ZOFRAN-ODT) 4 MG disintegrating tablet, Take 1 tablet (4 mg total) by mouth every 8 (eight) hours as needed for nausea., Disp: 30 tablet, Rfl: 3 .  rizatriptan (MAXALT) 5 MG tablet, Take 1 tablet (5 mg total) by mouth once as needed for migraine. May repeat in 2 hours if needed, Disp: 9 tablet, Rfl: 2   Allergies  Allergen Reactions  . Sulfa Antibiotics Anaphylaxis     Review of Systems  Constitutional: Negative.   HENT: Negative.   Eyes: Negative.   Respiratory: Negative.   Cardiovascular: Negative.   Gastrointestinal: Negative.   Endocrine: Negative.   Genitourinary: Negative.   Musculoskeletal: Positive for back pain and neck pain.  Allergic/Immunologic: Negative.   Neurological: Positive for headaches.  Psychiatric/Behavioral: Negative.      Today's Vitals   05/08/18 0933  BP: 108/64  Pulse: 63  Temp: 98.6 F (37 C)  TempSrc: Oral  SpO2: 98%  Weight: 122  lb (55.3 kg)   Body mass index is 24.43 kg/m.   Objective:  Physical Exam  Constitutional: She is oriented to person, place, and time.  Cardiovascular: Normal rate, regular rhythm, normal heart sounds and intact distal pulses.  Pulmonary/Chest: Effort normal and breath sounds normal. Right breast exhibits no inverted nipple, no mass, no nipple discharge, no skin change and no tenderness. Left breast exhibits no inverted nipple, no mass, no nipple discharge, no skin change and no tenderness.  Abdominal: Soft. Bowel sounds are normal.    Genitourinary:  Genitourinary Comments: DEFERRED  Musculoskeletal: Normal range of motion.       Arms: TENDERNESS TO PALPATION OF MARKED AREAS  Neurological: She is alert and oriented to person, place, and time.  Skin: Skin is warm and dry.  Psychiatric: She has a normal mood and affect.  Nursing note and vitals reviewed.       Assessment And Plan:    1. Routine general medical examination at health care facility  A FULL EXAM WAS PERFORMED.  IMPORTANCE OF MONTHLY SELF BREAST EXAMS WAS DISCUSSED WITH THE PATIENT.  PATIENT HAS BEEN ADVISED TO GET 30-45 MINUTES REGULAR EXERCISE NO LESS THAN FOUR TO FIVE DAYS PER WEEK - BOTH WEIGHTBEARING EXERCISES AND AEROBIC ARE RECOMMENDED.  SHE IS ADVISED TO FOLLOW A HEALTHY DIET WITH AT LEAST SIX FRUITS/VEGGIES PER DAY, DECREASE INTAKE OF RED MEAT, AND TO INCREASE FISH INTAKE TO TWO DAYS PER WEEK.  MEATS/FISH SHOULD NOT BE FRIED, BAKED OR BROILED IS PREFERABLE.  I SUGGEST WEARING SPF 50 SUNSCREEN ON EXPOSED PARTS AND ESPECIALLY WHEN IN THE DIRECT SUNLIGHT FOR AN EXTENDED PERIOD OF TIME.  PLEASE AVOID FAST FOOD RESTAURANTS AND INCREASE YOUR WATER INTAKE.  - CBC no Diff - CMP14+EGFR - Lipid Profile - Magnesium - Ambulatory referral to Gastroenterology - POCT Urinalysis Dipstick (81002)  2. Migraine without status migrainosus, not intractable, unspecified migraine type  CHRONIC, INTERMITTENT.  SHE IS ENCOURAGED TO START MAGNESIUM SUPPLEMENTATION, 250-400MG NIGHTLY. SHE WILL GET OTC.   3. Cervicalgia  HER SX ARE LIKELY DUE TO MUSCULAR SPASMS. AGAIN, SHE IS ENCOURAGED TO CONTINUE WITH MG SUPPLEMENTATION. SHE IS ALSO ENCOURAGED TO CONSIDER CHIROPRACTIC THERAPY AND ACUPUNCTURE. DRY NEEDLING MAY ALSO BE HELPFUL.   4. Chronic bilateral low back pain without sciatica  CHRONIC. PLEASE SEE #3.    Maximino Greenland, MD

## 2018-05-09 LAB — CMP14+EGFR
ALBUMIN: 4.4 g/dL (ref 3.5–5.5)
ALT: 11 IU/L (ref 0–32)
AST: 11 IU/L (ref 0–40)
Albumin/Globulin Ratio: 1.5 (ref 1.2–2.2)
Alkaline Phosphatase: 57 IU/L (ref 39–117)
BUN / CREAT RATIO: 15 (ref 9–23)
BUN: 10 mg/dL (ref 6–24)
Bilirubin Total: 0.5 mg/dL (ref 0.0–1.2)
CALCIUM: 9.1 mg/dL (ref 8.7–10.2)
CO2: 23 mmol/L (ref 20–29)
CREATININE: 0.68 mg/dL (ref 0.57–1.00)
Chloride: 102 mmol/L (ref 96–106)
GFR, EST AFRICAN AMERICAN: 119 mL/min/{1.73_m2} (ref >59)
GFR, EST NON AFRICAN AMERICAN: 103 mL/min/{1.73_m2} (ref >59)
GLUCOSE: 78 mg/dL (ref 65–99)
Globulin, Total: 2.9 g/dL (ref 1.5–4.5)
Potassium: 4.2 mmol/L (ref 3.5–5.2)
Sodium: 139 mmol/L (ref 134–144)
Total Protein: 7.3 g/dL (ref 6.0–8.5)

## 2018-05-09 LAB — LIPID PANEL
CHOL/HDL RATIO: 2.7 ratio (ref 0.0–4.4)
CHOLESTEROL TOTAL: 203 mg/dL — AB (ref 100–199)
HDL: 74 mg/dL (ref >39)
LDL CALC: 120 mg/dL — AB (ref 0–99)
Triglycerides: 45 mg/dL (ref 0–149)
VLDL CHOLESTEROL CAL: 9 mg/dL (ref 5–40)

## 2018-05-09 LAB — CBC
HEMOGLOBIN: 11.9 g/dL (ref 11.1–15.9)
Hematocrit: 37.2 % (ref 34.0–46.6)
MCH: 28.5 pg (ref 26.6–33.0)
MCHC: 32 g/dL (ref 31.5–35.7)
MCV: 89 fL (ref 79–97)
Platelets: 328 10*3/uL (ref 150–450)
RBC: 4.17 x10E6/uL (ref 3.77–5.28)
RDW: 14.2 % (ref 12.3–15.4)
WBC: 3.8 10*3/uL (ref 3.4–10.8)

## 2018-05-09 LAB — MAGNESIUM: MAGNESIUM: 2 mg/dL (ref 1.6–2.3)

## 2018-05-10 NOTE — Progress Notes (Signed)
Here are your lab results:  Your blood count is normal.  Your liver and kidney function are normal.  Your LDL, bad cholesterol, is 120. I suggest exercising no less than 150 minutes per week and avoid fried foods when possible. Your magnesium levels are within normal limits. You will still benefit from magnesium supplementation.   Please let me know if you have any other concerns.   Sincerely,    Skylen Danielsen N. Baird Cancer, MD

## 2018-10-08 ENCOUNTER — Other Ambulatory Visit: Payer: Self-pay | Admitting: Nurse Practitioner

## 2018-10-21 ENCOUNTER — Telehealth: Payer: Self-pay | Admitting: Neurology

## 2018-10-21 NOTE — Telephone Encounter (Signed)
Pt is there anything that she can take to help prevent the dizziness and nausea when using the computer a lot or watching tv or a lot of movement. Pt was last seen 4/19. Please call to advise

## 2018-10-21 NOTE — Telephone Encounter (Signed)
Spoke with Dr. Jaynee Eagles. Patient needs video or telephone visit.

## 2018-10-21 NOTE — Telephone Encounter (Signed)
Spoke with patient and discussed that pt is due for an office visit for follow-up. I explained that d/t covid 19 pandemic, our office is providing virtual and telephone visits in order to minimize the risk to our patients and healthcare providers. Advised that although there may be some limitations with this type of visit, we will take all precautions to reduce any security or privacy concerns. This will be treated like an in-office visit and we will file with her insurance, and there may be a patient responsible charge related to this service. The patient gave consent for a virtual webex visit, filed to her insurance. She is aware that the co-pay will be waived. I discussed instructions on how to download the webex app. Pt's email is [email or web address]. I advised that we would email her with an invitation to join the meeting. We scheduled her appt for Tuesday 10/28/2018 @ 10:00 AM. Pt aware she will receive a call ahead of time to update her medications, history, etc. She verbalized appreciation.   webex  Meeting created & emailed to pt.

## 2018-10-27 NOTE — Telephone Encounter (Signed)
LMVM for pt to return call for med, allergies history update prior to appt tomorrow 10-28-18 at 1000.
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
## 2018-10-28 ENCOUNTER — Encounter: Payer: Self-pay | Admitting: Neurology

## 2018-10-28 ENCOUNTER — Other Ambulatory Visit: Payer: Self-pay

## 2018-10-28 ENCOUNTER — Ambulatory Visit (INDEPENDENT_AMBULATORY_CARE_PROVIDER_SITE_OTHER): Payer: BC Managed Care – PPO | Admitting: Neurology

## 2018-10-28 DIAGNOSIS — G43709 Chronic migraine without aura, not intractable, without status migrainosus: Secondary | ICD-10-CM | POA: Insufficient documentation

## 2018-10-28 DIAGNOSIS — R42 Dizziness and giddiness: Secondary | ICD-10-CM

## 2018-10-28 DIAGNOSIS — H832X9 Labyrinthine dysfunction, unspecified ear: Secondary | ICD-10-CM

## 2018-10-28 MED ORDER — MECLIZINE HCL 25 MG PO TABS: 25.0000 mg | ORAL_TABLET | Freq: Three times a day (TID) | ORAL | 6 refills | Status: DC | PRN

## 2018-10-28 MED ORDER — RIZATRIPTAN BENZOATE 5 MG PO TABS: 5.0000 mg | ORAL_TABLET | Freq: Once | ORAL | 2 refills | Status: DC | PRN

## 2018-10-28 MED ORDER — ONDANSETRON 4 MG PO TBDP: 4.0000 mg | ORAL_TABLET | Freq: Three times a day (TID) | ORAL | 3 refills | Status: DC | PRN

## 2018-10-28 MED ORDER — CYCLOBENZAPRINE HCL 5 MG PO TABS: 5.0000 mg | ORAL_TABLET | Freq: Every day | ORAL | 4 refills | Status: DC

## 2018-10-28 NOTE — Telephone Encounter (Signed)
Updated medications, allergies, history, pharmacy per pt this am.

## 2018-12-17 ENCOUNTER — Other Ambulatory Visit: Payer: Self-pay | Admitting: Nurse Practitioner

## 2018-12-18 ENCOUNTER — Other Ambulatory Visit: Payer: Self-pay

## 2019-01-10 ENCOUNTER — Other Ambulatory Visit: Payer: Self-pay | Admitting: Nurse Practitioner

## 2019-01-19 NOTE — Telephone Encounter (Signed)
Ibuprofen 800mg refill.

## 2019-04-17 ENCOUNTER — Other Ambulatory Visit: Payer: Self-pay

## 2019-04-17 DIAGNOSIS — Z20822 Contact with and (suspected) exposure to covid-19: Secondary | ICD-10-CM

## 2019-04-18 LAB — NOVEL CORONAVIRUS, NAA: SARS-CoV-2, NAA: NOT DETECTED

## 2019-05-12 ENCOUNTER — Other Ambulatory Visit: Payer: Self-pay

## 2019-05-12 ENCOUNTER — Ambulatory Visit: Payer: BC Managed Care – PPO | Admitting: Internal Medicine

## 2019-05-12 ENCOUNTER — Encounter: Payer: Self-pay | Admitting: Internal Medicine

## 2019-05-12 VITALS — BP 118/74 | HR 74 | Temp 97.9°F | Ht 59.2 in | Wt 130.4 lb

## 2019-05-12 DIAGNOSIS — G43909 Migraine, unspecified, not intractable, without status migrainosus: Secondary | ICD-10-CM | POA: Diagnosis not present

## 2019-05-12 DIAGNOSIS — R002 Palpitations: Secondary | ICD-10-CM | POA: Diagnosis not present

## 2019-05-12 DIAGNOSIS — R5383 Other fatigue: Secondary | ICD-10-CM | POA: Diagnosis not present

## 2019-05-12 DIAGNOSIS — N951 Menopausal and female climacteric states: Secondary | ICD-10-CM

## 2019-05-12 DIAGNOSIS — Z0000 Encounter for general adult medical examination without abnormal findings: Secondary | ICD-10-CM | POA: Diagnosis not present

## 2019-05-12 LAB — POCT URINALYSIS DIPSTICK
Bilirubin, UA: NEGATIVE
Blood, UA: NEGATIVE
Glucose, UA: NEGATIVE
Ketones, UA: NEGATIVE
Leukocytes, UA: NEGATIVE
Nitrite, UA: NEGATIVE
Protein, UA: NEGATIVE
Spec Grav, UA: 1.01 (ref 1.010–1.025)
Urobilinogen, UA: 0.2 E.U./dL
pH, UA: 6.5 (ref 5.0–8.0)

## 2019-05-12 MED ORDER — IBUPROFEN 800 MG PO TABS: 800.0000 mg | ORAL_TABLET | Freq: Every day | ORAL | 0 refills | Status: DC | PRN

## 2019-05-12 MED ORDER — UBRELVY 50 MG PO TABS: 50.0000 mg | ORAL_TABLET | Freq: Every day | ORAL | 0 refills | Status: DC | PRN

## 2019-05-12 NOTE — Patient Instructions (Addendum)
Health Maintenance, Female Adopting a healthy lifestyle and getting preventive care are important in promoting health and wellness. Ask your health care provider about:  The right schedule for you to have regular tests and exams.  Things you can do on your own to prevent diseases and keep yourself healthy. What should I know about diet, weight, and exercise? Eat a healthy diet   Eat a diet that includes plenty of vegetables, fruits, low-fat dairy products, and lean protein.  Do not eat a lot of foods that are high in solid fats, added sugars, or sodium. Maintain a healthy weight Body mass index (BMI) is used to identify weight problems. It estimates body fat based on height and weight. Your health care provider can help determine your BMI and help you achieve or maintain a healthy weight. Get regular exercise Get regular exercise. This is one of the most important things you can do for your health. Most adults should:  Exercise for at least 150 minutes each week. The exercise should increase your heart rate and make you sweat (moderate-intensity exercise).  Do strengthening exercises at least twice a week. This is in addition to the moderate-intensity exercise.  Spend less time sitting. Even light physical activity can be beneficial. Watch cholesterol and blood lipids Have your blood tested for lipids and cholesterol at 50 years of age, then have this test every 5 years. Have your cholesterol levels checked more often if:  Your lipid or cholesterol levels are high.  You are older than 50 years of age.  You are at high risk for heart disease. What should I know about cancer screening? Depending on your health history and family history, you may need to have cancer screening at various ages. This may include screening for:  Breast cancer.  Cervical cancer.  Colorectal cancer.  Skin cancer.  Lung cancer. What should I know about heart disease, diabetes, and high blood  pressure? Blood pressure and heart disease  High blood pressure causes heart disease and increases the risk of stroke. This is more likely to develop in people who have high blood pressure readings, are of African descent, or are overweight.  Have your blood pressure checked: ? Every 3-5 years if you are 18-39 years of age. ? Every year if you are 40 years old or older. Diabetes Have regular diabetes screenings. This checks your fasting blood sugar level. Have the screening done:  Once every three years after age 40 if you are at a normal weight and have a low risk for diabetes.  More often and at a younger age if you are overweight or have a high risk for diabetes. What should I know about preventing infection? Hepatitis B If you have a higher risk for hepatitis B, you should be screened for this virus. Talk with your health care provider to find out if you are at risk for hepatitis B infection. Hepatitis C Testing is recommended for:  Everyone born from 1945 through 1965.  Anyone with known risk factors for hepatitis C. Sexually transmitted infections (STIs)  Get screened for STIs, including gonorrhea and chlamydia, if: ? You are sexually active and are younger than 50 years of age. ? You are older than 50 years of age and your health care provider tells you that you are at risk for this type of infection. ? Your sexual activity has changed since you were last screened, and you are at increased risk for chlamydia or gonorrhea. Ask your health care provider if   you are at risk.  Ask your health care provider about whether you are at high risk for HIV. Your health care provider may recommend a prescription medicine to help prevent HIV infection. If you choose to take medicine to prevent HIV, you should first get tested for HIV. You should then be tested every 3 months for as long as you are taking the medicine. Pregnancy  If you are about to stop having your period (premenopausal) and  you may become pregnant, seek counseling before you get pregnant.  Take 400 to 800 micrograms (mcg) of folic acid every day if you become pregnant.  Ask for birth control (contraception) if you want to prevent pregnancy. Osteoporosis and menopause Osteoporosis is a disease in which the bones lose minerals and strength with aging. This can result in bone fractures. If you are 48 years old or older, or if you are at risk for osteoporosis and fractures, ask your health care provider if you should:  Be screened for bone loss.  Take a calcium or vitamin D supplement to lower your risk of fractures.  Be given hormone replacement therapy (HRT) to treat symptoms of menopause. Follow these instructions at home: Lifestyle  Do not use any products that contain nicotine or tobacco, such as cigarettes, e-cigarettes, and chewing tobacco. If you need help quitting, ask your health care provider.  Do not use street drugs.  Do not share needles.  Ask your health care provider for help if you need support or information about quitting drugs. Alcohol use  Do not drink alcohol if: ? Your health care provider tells you not to drink. ? You are pregnant, may be pregnant, or are planning to become pregnant.  If you drink alcohol: ? Limit how much you use to 0-1 drink a day. ? Limit intake if you are breastfeeding.  Be aware of how much alcohol is in your drink. In the [place] one drink equals one 12 oz bottle of beer (355 mL), one 5 oz glass of wine (148 mL), or one 1 oz glass of hard liquor (44 mL). General instructions  Schedule regular health, dental, and eye exams.  Stay current with your vaccines.  Tell your health care provider if: ? You often feel depressed. ? You have ever been abused or do not feel safe at home. Summary  Adopting a healthy lifestyle and getting preventive care are important in promoting health and wellness.  Follow your health care provider's instructions about healthy  diet, exercising, and getting tested or screened for diseases.  Follow your health care provider's instructions on monitoring your cholesterol and blood pressure. This information is not intended to replace advice given to you by your health care provider. Make sure you discuss any questions you have with your health care provider. Document Released: 01/22/2011 Document Revised: 07/02/2018 Document Reviewed: 07/02/2018 Elsevier Patient Education  2020 [employer].   0

## 2019-05-13 LAB — CBC
Hematocrit: 37.4 % (ref 34.0–46.6)
Hemoglobin: 12 g/dL (ref 11.1–15.9)
MCH: 28.6 pg (ref 26.6–33.0)
MCHC: 32.1 g/dL (ref 31.5–35.7)
MCV: 89 fL (ref 79–97)
Platelets: 332 10*3/uL (ref 150–450)
RBC: 4.19 x10E6/uL (ref 3.77–5.28)
RDW: 14.7 % (ref 11.7–15.4)
WBC: 4.9 10*3/uL (ref 3.4–10.8)

## 2019-05-13 LAB — HEMOGLOBIN A1C
Est. average glucose Bld gHb Est-mCnc: 111 mg/dL
Hgb A1c MFr Bld: 5.5 % (ref 4.8–5.6)

## 2019-05-13 LAB — CMP14+EGFR
ALT: 14 IU/L (ref 0–32)
AST: 17 IU/L (ref 0–40)
Albumin/Globulin Ratio: 1.5 (ref 1.2–2.2)
Albumin: 4.4 g/dL (ref 3.8–4.8)
Alkaline Phosphatase: 62 IU/L (ref 39–117)
BUN/Creatinine Ratio: 18 (ref 9–23)
BUN: 12 mg/dL (ref 6–24)
Bilirubin Total: 0.4 mg/dL (ref 0.0–1.2)
CO2: 23 mmol/L (ref 20–29)
Calcium: 9.4 mg/dL (ref 8.7–10.2)
Chloride: 101 mmol/L (ref 96–106)
Creatinine, Ser: 0.68 mg/dL (ref 0.57–1.00)
GFR calc Af Amer: 118 mL/min/{1.73_m2} (ref >59)
GFR calc non Af Amer: 102 mL/min/{1.73_m2} (ref >59)
Globulin, Total: 3 g/dL (ref 1.5–4.5)
Glucose: 79 mg/dL (ref 65–99)
Potassium: 4 mmol/L (ref 3.5–5.2)
Sodium: 138 mmol/L (ref 134–144)
Total Protein: 7.4 g/dL (ref 6.0–8.5)

## 2019-05-13 LAB — LIPID PANEL
Chol/HDL Ratio: 2.9 ratio (ref 0.0–4.4)
Cholesterol, Total: 211 mg/dL — ABNORMAL HIGH (ref 100–199)
HDL: 73 mg/dL (ref >39)
LDL Chol Calc (NIH): 129 mg/dL — ABNORMAL HIGH (ref 0–99)
Triglycerides: 49 mg/dL (ref 0–149)
VLDL Cholesterol Cal: 9 mg/dL (ref 5–40)

## 2019-05-13 LAB — TSH: TSH: 2.52 u[IU]/mL (ref 0.450–4.500)

## 2019-05-13 LAB — ABO/RH: Rh Factor: POSITIVE

## 2019-05-14 ENCOUNTER — Encounter: Payer: BC Managed Care – PPO | Admitting: Internal Medicine

## 2019-05-15 ENCOUNTER — Other Ambulatory Visit: Payer: Self-pay

## 2019-05-15 DIAGNOSIS — Z20822 Contact with and (suspected) exposure to covid-19: Secondary | ICD-10-CM

## 2019-05-15 NOTE — Progress Notes (Signed)
Subjective:     Patient ID: Jennifer Manning , female    DOB: 01/27/1971 , 48 y.o.   MRN: [id number]   Chief Complaint  Patient presents with  . Annual Exam    HPI  She is here today for a full physical examination. She is followed by Dr. Cletis Media for her GYN exams. She has multiple concerns today including fatigue, palpitations and migraines. She reports persistent fatigue - she is not sure what is contributing to her symptoms. She denies change in her eating/sleeping habits. She reports she exercises less b/c she runs out of energy. She does admit that she could increase her water intake. She reports having intermittent palpitations. They can occur at rest and with exertion; but usually with rest. She denies associated chest pain and shortness of breath.   Lastly, she feels her migraines are worsening in both frequency and severity. She admits that she has not been compliant with her water intake.     Past Medical History:  Diagnosis Date  . Migraines   . Post concussive syndrome      Family History  Problem Relation Age of Onset  . Heart Problems Mother   . Hyperlipidemia Mother   . Hypertension Mother   . Hyperlipidemia Father   . Hyperlipidemia Brother   . Hypertension Brother   . Dementia Neg Hx      Current Outpatient Medications:  .  Ascorbic Acid (VITAMIN C) 100 MG tablet, Take 100 mg by mouth daily., Disp: , Rfl:  .  aspirin-acetaminophen-caffeine (EXCEDRIN MIGRAINE) 250-250-65 MG per tablet, Take 1-2 tablets by mouth every 6 (six) hours as needed for headache., Disp: , Rfl:  .  b complex vitamins capsule, Take 1 capsule by mouth daily., Disp: , Rfl:  .  budesonide-formoterol (SYMBICORT) 80-4.5 MCG/ACT inhaler, Inhale 2 puffs into the lungs 2 (two) times daily., Disp: , Rfl:  .  cetirizine (ZYRTEC) 10 MG tablet, Take 10 mg by mouth daily., Disp: , Rfl:  .  Cholecalciferol (VITAMIN D PO), Take 1 tablet by mouth daily. 2000units, Disp: , Rfl:  .  cyclobenzaprine  (FLEXERIL) 5 MG tablet, Take 1 tablet (5 mg total) by mouth at bedtime., Disp: 90 tablet, Rfl: 4 .  ibuprofen (ADVIL) 800 MG tablet, Take 1 tablet (800 mg total) by mouth daily as needed., Disp: 30 tablet, Rfl: 0 .  magnesium oxide (MAG-OX) 400 (241.3 Mg) MG tablet, magnesium oxide 400 mg tablet  TAKE 1 TABLET WITH DAILY WITH EVENING MEAL, Disp: , Rfl:  .  meclizine (ANTIVERT) 25 MG tablet, Take 1 tablet (25 mg total) by mouth 3 (three) times daily as needed for dizziness., Disp: 30 tablet, Rfl: 6 .  Omega-3 1000 MG CAPS, Take 1,000 mg by mouth daily., Disp: , Rfl:  .  ondansetron (ZOFRAN-ODT) 4 MG disintegrating tablet, Take 1 tablet (4 mg total) by mouth every 8 (eight) hours as needed for nausea. And dizziness and motion intolerance, Disp: 30 tablet, Rfl: 3 .  PROAIR HFA 108 (90 Base) MCG/ACT inhaler, TAKE 2 PUFFS BY MOUTH EVERY 4 TO 6 HOURS AS NEEDED, Disp: 8.5 Inhaler, Rfl: 2 .  rizatriptan (MAXALT) 5 MG tablet, Take 1 tablet (5 mg total) by mouth once as needed for up to 30 days for migraine. May repeat in 2 hours if needed, Disp: 9 tablet, Rfl: 2 .  Ubrogepant (UBRELVY) 50 MG TABS, Take 50 mg by mouth daily as needed., Disp: 8 tablet, Rfl: 0   Allergies  Allergen Reactions  .  Sulfa Antibiotics Anaphylaxis     The patient states she uses rhythm method for birth control. Last LMP was Patient's last menstrual period was 05/03/2019.. Negative for Dysmenorrhea Negative for: breast discharge, breast lump(s), breast pain and breast self exam. Associated symptoms include abnormal vaginal bleeding. Pertinent negatives include abnormal bleeding (hematology), anxiety, decreased libido, depression, difficulty falling sleep, dyspareunia, history of infertility, nocturia, sexual dysfunction, sleep disturbances, urinary incontinence, urinary urgency, vaginal discharge and vaginal itching. Diet regular.The patient states her exercise level is  intermittent.   . The patient's tobacco use is:  Social History    Tobacco Use  Smoking Status Never Smoker  Smokeless Tobacco Never Used  . She has been exposed to passive smoke. The patient's alcohol use is:  Social History   Substance and Sexual Activity  Alcohol Use No    Review of Systems  Constitutional: Positive for fatigue.  HENT: Negative.   Eyes: Negative.   Respiratory: Negative.   Cardiovascular: Positive for palpitations.  Endocrine: Negative.   Genitourinary: Negative.   Musculoskeletal: Negative.   Skin: Negative.   Allergic/Immunologic: Negative.   Neurological: Positive for headaches.  Hematological: Negative.   Psychiatric/Behavioral: Negative.      Today's Vitals   05/12/19 1151  BP: 118/74  Pulse: 74  Temp: 97.9 F (36.6 C)  TempSrc: Oral  Weight: 130 lb 6.4 oz (59.1 kg)  Height: 4' 11.2" (1.504 m)   Body mass index is 26.16 kg/m.   Objective:  Physical Exam Vitals signs and nursing note reviewed.  Constitutional:      Appearance: Normal appearance.  HENT:     Head: Normocephalic and atraumatic.     Right Ear: Tympanic membrane, ear canal and external ear normal.     Left Ear: Tympanic membrane, ear canal and external ear normal.     Nose: Nose normal.     Mouth/Throat:     Mouth: Mucous membranes are moist.     Pharynx: Oropharynx is clear.  Eyes:     Extraocular Movements: Extraocular movements intact.     Conjunctiva/sclera: Conjunctivae normal.     Pupils: Pupils are equal, round, and reactive to light.  Neck:     Musculoskeletal: Normal range of motion and neck supple.  Cardiovascular:     Rate and Rhythm: Normal rate and regular rhythm.     Pulses: Normal pulses.     Heart sounds: Normal heart sounds.  Pulmonary:     Effort: Pulmonary effort is normal.     Breath sounds: Normal breath sounds.  Chest:     Breasts: Tanner Score is 5.        Right: Normal.        Left: Normal.  Abdominal:     General: Abdomen is flat. Bowel sounds are normal.     Palpations: Abdomen is soft.   Genitourinary:    Comments: deferred Musculoskeletal: Normal range of motion.  Skin:    General: Skin is warm and dry.  Neurological:     General: No focal deficit present.     Mental Status: She is alert and oriented to person, place, and time.  Psychiatric:        Mood and Affect: Mood normal.        Behavior: Behavior normal.         Assessment And Plan:     1. Encounter for annual physical exam  A full exam was performed.  Importance of monthly self breast exams was discussed with the patient. PATIENT HAS  BEEN ADVISED TO GET 30-45 MINUTES REGULAR EXERCISE NO LESS THAN FOUR TO FIVE DAYS PER WEEK - BOTH WEIGHTBEARING EXERCISES AND AEROBIC ARE RECOMMENDED.  SHE WAS ADVISED TO FOLLOW A HEALTHY DIET WITH AT LEAST SIX FRUITS/VEGGIES PER DAY, DECREASE INTAKE OF RED MEAT, AND TO INCREASE FISH INTAKE TO TWO DAYS PER WEEK.  MEATS/FISH SHOULD NOT BE FRIED, BAKED OR BROILED IS PREFERABLE.  I SUGGEST WEARING SPF 50 SUNSCREEN ON EXPOSED PARTS AND ESPECIALLY WHEN IN THE DIRECT SUNLIGHT FOR AN EXTENDED PERIOD OF TIME.  PLEASE AVOID FAST FOOD RESTAURANTS AND INCREASE YOUR WATER INTAKE.  - POCT Urinalysis Dipstick (81002) - CMP14+EGFR - CBC - Lipid panel - Hemoglobin A1c - ABO/Rh - TSH  2. Fatigue, unspecified type  Chronic. I will check CBC and thyroid function today. She is encouraged to increase her water intake as well. She will let me know if her sx persist. She did inquire about vitamin supplementation. She is advised to start a whole foods vitamin.   3. Perimenopause  Patient advised that a lot of her sx may be related to her perimenopausal state. She is encouraged to follow a clean diet free of sugary beverages. She may benefit from progesterone supplementation. She is encouraged to discuss further with her GYN.   4. Palpitations  Chronic, intermittent. She is advised to start magnesium supplementation nightly.   5. Migraine without status migrainosus, not intractable,  unspecified migraine type  She was given sample of Ubrelvy to take at onset of migraine headache. Again, the importance of magnesium supplementation was discussed with the patient. She was advised to try the Calm brand.   Maximino Greenland, MD    THE PATIENT IS ENCOURAGED TO PRACTICE SOCIAL DISTANCING DUE TO THE COVID-19 PANDEMIC.

## 2019-05-16 LAB — NOVEL CORONAVIRUS, NAA: SARS-CoV-2, NAA: NOT DETECTED

## 2019-05-20 ENCOUNTER — Other Ambulatory Visit: Payer: Self-pay

## 2019-05-20 DIAGNOSIS — Z20822 Contact with and (suspected) exposure to covid-19: Secondary | ICD-10-CM

## 2019-05-22 LAB — NOVEL CORONAVIRUS, NAA: SARS-CoV-2, NAA: NOT DETECTED

## 2019-06-17 ENCOUNTER — Other Ambulatory Visit: Payer: Self-pay

## 2019-06-17 DIAGNOSIS — Z20822 Contact with and (suspected) exposure to covid-19: Secondary | ICD-10-CM

## 2019-06-18 LAB — NOVEL CORONAVIRUS, NAA: SARS-CoV-2, NAA: NOT DETECTED

## 2019-08-20 ENCOUNTER — Ambulatory Visit: Payer: No Typology Code available for payment source | Attending: Internal Medicine

## 2019-08-20 ENCOUNTER — Ambulatory Visit: Payer: Self-pay | Admitting: Cardiology

## 2019-08-20 DIAGNOSIS — Z20822 Contact with and (suspected) exposure to covid-19: Secondary | ICD-10-CM | POA: Insufficient documentation

## 2019-08-21 LAB — NOVEL CORONAVIRUS, NAA: SARS-CoV-2, NAA: NOT DETECTED

## 2019-08-24 ENCOUNTER — Other Ambulatory Visit: Payer: Self-pay

## 2019-08-24 ENCOUNTER — Telehealth: Payer: Self-pay

## 2019-08-24 ENCOUNTER — Telehealth (INDEPENDENT_AMBULATORY_CARE_PROVIDER_SITE_OTHER): Payer: BC Managed Care – PPO | Admitting: Nurse Practitioner

## 2019-08-24 ENCOUNTER — Encounter: Payer: Self-pay | Admitting: Nurse Practitioner

## 2019-08-24 VITALS — Ht 60.0 in | Wt 130.0 lb

## 2019-08-24 DIAGNOSIS — R05 Cough: Secondary | ICD-10-CM | POA: Diagnosis not present

## 2019-08-24 DIAGNOSIS — R5383 Other fatigue: Secondary | ICD-10-CM | POA: Diagnosis not present

## 2019-08-24 DIAGNOSIS — Z20822 Contact with and (suspected) exposure to covid-19: Secondary | ICD-10-CM

## 2019-08-24 DIAGNOSIS — H938X2 Other specified disorders of left ear: Secondary | ICD-10-CM | POA: Diagnosis not present

## 2019-08-24 DIAGNOSIS — R110 Nausea: Secondary | ICD-10-CM

## 2019-08-24 MED ORDER — MOMETASONE FUROATE 50 MCG/ACT NA SUSP
2.0000 | Freq: Every day | NASAL | 2 refills | Status: DC
Start: 2019-08-24 — End: 2022-05-15

## 2019-08-24 NOTE — Telephone Encounter (Signed)
The pt was scheduled a virtual appointment for evaluation of cold symptoms.

## 2019-08-24 NOTE — Progress Notes (Signed)
Virtual Visit via Video   This visit type was conducted due to national recommendations for restrictions regarding the COVID-19 Pandemic (e.g. social distancing) in an effort to limit this patient's exposure and mitigate transmission in our community.  Due to her co-morbid illnesses, this patient is at least at moderate risk for complications without adequate follow up.  This format is felt to be most appropriate for this patient at this time.  All issues noted in this document were discussed and addressed.  A limited physical exam was performed with this format.    This visit type was conducted due to national recommendations for restrictions regarding the COVID-19 Pandemic (e.g. social distancing) in an effort to limit this patient's exposure and mitigate transmission in our community.  Patients identity confirmed using two different identifiers.  This format is felt to be most appropriate for this patient at this time.  All issues noted in this document were discussed and addressed.  No physical exam was performed (except for noted visual exam findings with Video Visits).    Date:  08/24/2019   ID:  Jennifer Manning, DOB [birth year], MRN [id number]  Patient Location:  Home - spoke with [name]  Provider location:   Office    Chief Complaint:  Ear pain  History of Present Illness:    Jennifer Manning is a 49 y.o. female who presents via video conferencing for a telehealth visit today.    The patient does have symptoms concerning for COVID-19 infection (fever, chills, cough, or new shortness of breath).   Wednesday found out had been exposed to covid.  By Friday felt like a cold was coming, she also felt fatigue, she is having pressure in her ears and drainage. She had a nagging headache. Would take medication and would improve. Taste buds were "shaky".  She had nausea, able to eat broth soup and crackers. Denies fever.  Intermittent cough has been taking zinc, vitamin c, elderberry  (black).  Today she felt better moving around.  Felt like fatigued and felt like ran a race thought she would get relief by lying on counter.    She has used her inhaler at that time, unsure if made feel better.     URI  This is a new problem. The current episode started in the past 7 days. There has been no fever. Associated symptoms include coughing (mild). Pertinent negatives include no abdominal pain, chest pain, congestion, ear pain, headaches, nausea, rhinorrhea or wheezing. She has tried inhaler use and acetaminophen for the symptoms. The treatment provided mild relief.     Past Medical History:  Diagnosis Date  . Migraines   . Post concussive syndrome    Past Surgical History:  Procedure Laterality Date  . TONSILLECTOMY    . TUBAL LIGATION       Current Meds  Medication Sig  . Ascorbic Acid (VITAMIN C) 100 MG tablet Take 100 mg by mouth daily.  Marland Kitchen aspirin-acetaminophen-caffeine (EXCEDRIN MIGRAINE) 250-250-65 MG per tablet Take 1-2 tablets by mouth every 6 (six) hours as needed for headache.  . b complex vitamins capsule Take 1 capsule by mouth daily.  . budesonide-formoterol (SYMBICORT) 80-4.5 MCG/ACT inhaler Inhale 2 puffs into the lungs 2 (two) times daily.  . cetirizine (ZYRTEC) 10 MG tablet Take 10 mg by mouth daily.  . Cholecalciferol (VITAMIN D PO) Take 1 tablet by mouth daily. 2000units  . cyclobenzaprine (FLEXERIL) 5 MG tablet Take 1 tablet (5 mg total) by mouth at bedtime.  Marland Kitchen  ibuprofen (ADVIL) 800 MG tablet Take 1 tablet (800 mg total) by mouth daily as needed.  . magnesium oxide (MAG-OX) 400 (241.3 Mg) MG tablet magnesium oxide 400 mg tablet  TAKE 1 TABLET WITH DAILY WITH EVENING MEAL  . meclizine (ANTIVERT) 25 MG tablet Take 1 tablet (25 mg total) by mouth 3 (three) times daily as needed for dizziness.  . Omega-3 1000 MG CAPS Take 1,000 mg by mouth daily.  . ondansetron (ZOFRAN-ODT) 4 MG disintegrating tablet Take 1 tablet (4 mg total) by mouth every 8 (eight) hours  as needed for nausea. And dizziness and motion intolerance  . PROAIR HFA 108 (90 Base) MCG/ACT inhaler TAKE 2 PUFFS BY MOUTH EVERY 4 TO 6 HOURS AS NEEDED  . rizatriptan (MAXALT) 5 MG tablet Take 1 tablet (5 mg total) by mouth once as needed for up to 30 days for migraine. May repeat in 2 hours if needed  . Ubrogepant (UBRELVY) 50 MG TABS Take 50 mg by mouth daily as needed.     Allergies:   Sulfa antibiotics   Social History   Tobacco Use  . Smoking status: Never Smoker  . Smokeless tobacco: Never Used  Substance Use Topics  . Alcohol use: No  . Drug use: No     Family Hx: The patient's family history includes Heart Problems in her mother; Hyperlipidemia in her brother, father, and mother; Hypertension in her brother and mother. There is no history of Dementia.  ROS:   Please see the history of present illness.    Review of Systems  HENT: Negative for congestion, ear pain and rhinorrhea.   Respiratory: Positive for cough (mild). Negative for shortness of breath and wheezing.   Cardiovascular: Negative for chest pain.  Gastrointestinal: Negative for abdominal pain and nausea.  Neurological: Negative for dizziness and headaches.  Psychiatric/Behavioral: Negative.  The patient is not nervous/anxious.     All other systems reviewed and are negative.   Labs/Other Tests and Data Reviewed:    Recent Labs: 05/12/2019: ALT 14; BUN 12; Creatinine, Ser 0.68; Hemoglobin 12.0; Platelets 332; Potassium 4.0; Sodium 138; TSH 2.520   Recent Lipid Panel Lab Results  Component Value Date/Time   CHOL 211 (H) 05/12/2019 02:11 PM   TRIG 49 05/12/2019 02:11 PM   HDL 73 05/12/2019 02:11 PM   CHOLHDL 2.9 05/12/2019 02:11 PM   LDLCALC 129 (H) 05/12/2019 02:11 PM    Wt Readings from Last 3 Encounters:  08/24/19 130 lb (59 kg)  05/12/19 130 lb 6.4 oz (59.1 kg)  05/08/18 122 lb (55.3 kg)     Exam:    Vital Signs:  Ht 5' (1.524 m)   Wt 130 lb (59 kg)   LMP 08/16/2019   BMI 25.39 kg/m       Physical Exam  Constitutional: She is oriented to person, place, and time and well-developed, well-nourished, and in no distress. No distress.  Pulmonary/Chest: Effort normal. No respiratory distress.  Neurological: She is alert and oriented to person, place, and time.  Psychiatric: Mood, memory, affect and judgment normal.    ASSESSMENT & PLAN:    1. Close exposure to COVID-19 virus  Went for covid testing on Friday was negative however had only been 2 days  She is to get retested tomorrow or Wednesday thought that she was tested too early  She is to continue with elderberry, zinc and vitamin c daily   2. Pressure sensation in left ear  Will treat with nasal spray - mometasone (NASONEX)  50 MCG/ACT nasal spray; Place 2 sprays into the nose daily.  Dispense: 17 g; Refill: 2  COVID-19 Education: The signs and symptoms of COVID-19 were discussed with the patient and how to seek care for testing (follow up with PCP or arrange E-visit).  The importance of social distancing was discussed today.  Patient Risk:   After full review of this patients clinical status, I feel that they are at least moderate risk at this time.  Time:   Today, I have spent 11.03 minutes/ seconds with the patient with telehealth technology discussing above diagnoses.     Medication Adjustments/Labs and Tests Ordered: Current medicines are reviewed at length with the patient today.  Concerns regarding medicines are outlined above.   Tests Ordered: No orders of the defined types were placed in this encounter.   Medication Changes: No orders of the defined types were placed in this encounter.   Disposition:  Follow up prn  Signed, [name], FNP

## 2019-09-02 ENCOUNTER — Ambulatory Visit (INDEPENDENT_AMBULATORY_CARE_PROVIDER_SITE_OTHER): Payer: BC Managed Care – PPO | Admitting: Nurse Practitioner

## 2019-09-02 ENCOUNTER — Encounter: Payer: Self-pay | Admitting: Nurse Practitioner

## 2019-09-02 ENCOUNTER — Other Ambulatory Visit: Payer: Self-pay

## 2019-09-02 VITALS — BP 110/80 | HR 66 | Temp 98.6°F

## 2019-09-02 DIAGNOSIS — H9203 Otalgia, bilateral: Secondary | ICD-10-CM

## 2019-09-02 DIAGNOSIS — R0981 Nasal congestion: Secondary | ICD-10-CM

## 2019-09-02 NOTE — Progress Notes (Signed)
This visit occurred during the SARS-CoV-2 public health emergency.  Safety protocols were in place, including screening questions prior to the visit, additional usage of staff PPE, and extensive cleaning of exam room while observing appropriate contact time as indicated for disinfecting solutions.  Subjective:     Patient ID: Jennifer Manning , female    DOB: 10/28/1970 , 49 y.o.   MRN: [id number]   Chief Complaint  Patient presents with  . Otalgia    patient stated her ears have been hurting, she has been having headaches. patient went to the urgent care on friday and was given amoxicillin and steroid dose pack.    HPI  She was seen Friday at urgent care and felt was sinus infection.  She is on amoxicillin and prednisone. It has been difficult with keeping food down due to nausea.  She is only able to get the amoxicillin down more than once a day. She is moving more, will get winded.  Nasal spray and black elderberry/zinc/vitamin d 3   Otalgia  There is pain in the left ear. This is a recurrent problem. The current episode started in the past 7 days. Pertinent negatives include no abdominal pain, coughing, headaches or sore throat. She has tried nothing for the symptoms.     Past Medical History:  Diagnosis Date  . Migraines   . Post concussive syndrome      Family History  Problem Relation Age of Onset  . Heart Problems Mother   . Hyperlipidemia Mother   . Hypertension Mother   . Hyperlipidemia Father   . Hyperlipidemia Brother   . Hypertension Brother   . Dementia Neg Hx      Current Outpatient Medications:  .  Ascorbic Acid (VITAMIN C) 100 MG tablet, Take 100 mg by mouth daily., Disp: , Rfl:  .  aspirin-acetaminophen-caffeine (EXCEDRIN MIGRAINE) 250-250-65 MG per tablet, Take 1-2 tablets by mouth every 6 (six) hours as needed for headache., Disp: , Rfl:  .  b complex vitamins capsule, Take 1 capsule by mouth daily., Disp: , Rfl:  .  budesonide-formoterol (SYMBICORT)  80-4.5 MCG/ACT inhaler, Inhale 2 puffs into the lungs 2 (two) times daily., Disp: , Rfl:  .  cetirizine (ZYRTEC) 10 MG tablet, Take 10 mg by mouth daily., Disp: , Rfl:  .  Cholecalciferol (VITAMIN D PO), Take 1 tablet by mouth daily. 2000units, Disp: , Rfl:  .  cyclobenzaprine (FLEXERIL) 5 MG tablet, Take 1 tablet (5 mg total) by mouth at bedtime., Disp: 90 tablet, Rfl: 4 .  ibuprofen (ADVIL) 800 MG tablet, Take 1 tablet (800 mg total) by mouth daily as needed., Disp: 30 tablet, Rfl: 0 .  magnesium oxide (MAG-OX) 400 (241.3 Mg) MG tablet, magnesium oxide 400 mg tablet  TAKE 1 TABLET WITH DAILY WITH EVENING MEAL, Disp: , Rfl:  .  meclizine (ANTIVERT) 25 MG tablet, Take 1 tablet (25 mg total) by mouth 3 (three) times daily as needed for dizziness., Disp: 30 tablet, Rfl: 6 .  mometasone (NASONEX) 50 MCG/ACT nasal spray, Place 2 sprays into the nose daily., Disp: 17 g, Rfl: 2 .  Omega-3 1000 MG CAPS, Take 1,000 mg by mouth daily., Disp: , Rfl:  .  ondansetron (ZOFRAN-ODT) 4 MG disintegrating tablet, Take 1 tablet (4 mg total) by mouth every 8 (eight) hours as needed for nausea. And dizziness and motion intolerance, Disp: 30 tablet, Rfl: 3 .  PROAIR HFA 108 (90 Base) MCG/ACT inhaler, TAKE 2 PUFFS BY MOUTH EVERY 4 TO  6 HOURS AS NEEDED, Disp: 8.5 Inhaler, Rfl: 2 .  Ubrogepant (UBRELVY) 50 MG TABS, Take 50 mg by mouth daily as needed., Disp: 8 tablet, Rfl: 0 .  rizatriptan (MAXALT) 5 MG tablet, Take 1 tablet (5 mg total) by mouth once as needed for up to 30 days for migraine. May repeat in 2 hours if needed, Disp: 9 tablet, Rfl: 2   Allergies  Allergen Reactions  . Sulfa Antibiotics Anaphylaxis     Review of Systems  HENT: Positive for ear pain, postnasal drip and sinus pressure. Negative for congestion and sore throat.   Respiratory: Negative for cough and wheezing.   Cardiovascular: Negative.  Negative for chest pain, palpitations and leg swelling.  Gastrointestinal: Negative for abdominal pain.   Neurological: Negative.  Negative for dizziness and headaches.  Psychiatric/Behavioral: Negative.      Today's Vitals   09/02/19 0931  BP: 110/80  Pulse: 66  Temp: 98.6 F (37 C)  TempSrc: Oral  PainSc: 6   PainLoc: Ear   There is no height or weight on file to calculate BMI.   Objective:  Physical Exam Vitals reviewed.  Constitutional:      General: She is not in acute distress.    Appearance: Normal appearance.  Cardiovascular:     Rate and Rhythm: Normal rate and regular rhythm.     Pulses: Normal pulses.     Heart sounds: Normal heart sounds. No murmur.  Pulmonary:     Effort: Pulmonary effort is normal. No respiratory distress.     Breath sounds: Normal breath sounds. No wheezing or rales.  Skin:    General: Skin is warm and dry.     Capillary Refill: Capillary refill takes less than 2 seconds.  Neurological:     General: No focal deficit present.     Mental Status: She is alert and oriented to person, place, and time.     Cranial Nerves: No cranial nerve deficit.  Psychiatric:        Mood and Affect: Mood normal.        Behavior: Behavior normal.        Thought Content: Thought content normal.        Judgment: Judgment normal.         Assessment And Plan:     1. Head congestion  She is being treated for a sinus infection with amoxicillin and prednisone  Mild sinus pressure frontal area  She has been negative twice for covid but still has symptoms will retest  She can return to work on Monday if symptoms improved - Novel Coronavirus, NAA ([facility])  2. Otalgia of both ears  Right ear with TM bulging - Novel Coronavirus, NAA ([facility])   [name], FNP    THE PATIENT IS ENCOURAGED TO PRACTICE SOCIAL DISTANCING DUE TO THE COVID-19 PANDEMIC.

## 2019-09-03 LAB — NOVEL CORONAVIRUS, NAA: SARS-CoV-2, NAA: NOT DETECTED

## 2019-09-04 ENCOUNTER — Encounter: Payer: Self-pay | Admitting: Cardiology

## 2019-09-04 ENCOUNTER — Other Ambulatory Visit: Payer: Self-pay

## 2019-09-04 ENCOUNTER — Ambulatory Visit: Payer: BC Managed Care – PPO | Admitting: Cardiology

## 2019-09-04 VITALS — BP 112/69 | HR 70 | Temp 97.3°F | Ht 60.0 in | Wt 125.9 lb

## 2019-09-04 DIAGNOSIS — R002 Palpitations: Secondary | ICD-10-CM

## 2019-09-04 DIAGNOSIS — E785 Hyperlipidemia, unspecified: Secondary | ICD-10-CM

## 2019-09-04 NOTE — Progress Notes (Signed)
Primary Physician/Referring:  Glendale Chard, MD  Patient ID: Jennifer Manning, female    DOB: 20-Dec-1970, 49 y.o.   MRN: [id number]  Chief Complaint  Patient presents with  . Palpitations  . Hyperlipidemia  . Follow-up    11yr   HPI:    Jennifer Manning  is a 49 y.o. fairly active to African-American female with history of palpitations, last seen by me in June 2019, family history of heart disease, mother deceased at age 44 with MI presents here for repeat visit referred to me by Dr. Baird Cancer due to recurrence of palpitations.  She had also called [place] during Christmas 2020 saying that she is having more frequent palpitations, described as skipped beats and feels like it is gone up stop her second also.  However symptoms are gotten better over the past 1 month and presently asymptomatic.  Past Medical History:  Diagnosis Date  . Migraines   . Post concussive syndrome    Past Surgical History:  Procedure Laterality Date  . TONSILLECTOMY    . TUBAL LIGATION     Social History   Tobacco Use  . Smoking status: Never Smoker  . Smokeless tobacco: Never Used  Substance Use Topics  . Alcohol use: No   ROS  Review of Systems  Cardiovascular: Positive for palpitations.   Objective  Blood pressure 112/69, pulse 70, temperature (!) 97.3 F (36.3 C), height 5' (1.524 m), weight 125 lb 14.4 oz (57.1 kg), last menstrual period 08/16/2019, SpO2 98 %.  Vitals with BMI 09/04/2019 09/02/2019 08/24/2019  Height 5\' 0"  - 5\' 0"   Weight 125 lbs 14 oz - 130 lbs  BMI [id number] - [id number]  Systolic [id number] (No Data)  Diastolic 69 80 (No Data)  Pulse 70 66 -     Physical Exam  Neck: No thyromegaly present.  Cardiovascular: Normal rate, regular rhythm, normal heart sounds and intact distal pulses. Exam reveals no gallop.  No murmur heard. No leg edema, no JVD.  Pulmonary/Chest: Effort normal and breath sounds normal.  Abdominal: Soft. Bowel sounds are normal.  Musculoskeletal:     Cervical back: Neck  supple.  Skin: Skin is warm and dry.   Laboratory examination:   Recent Labs    05/12/19 1411  NA 138  K 4.0  CL 101  CO2 23  GLUCOSE 79  BUN 12  CREATININE 0.68  CALCIUM 9.4  GFRNONAA 102  GFRAA 118   CrCl cannot be calculated (Patient's most recent lab result is older than the maximum 21 days allowed.).  CMP Latest Ref Rng & Units 05/12/2019 05/08/2018 09/04/2017  Glucose 65 - 99 mg/dL 79 78 -  BUN 6 - 24 mg/dL 12 10 8   Creatinine 0.57 - 1.00 mg/dL 0.68 0.68 0.7  Sodium 134 - 144 mmol/L 138 139 140  Potassium 3.5 - 5.2 mmol/L 4.0 4.2 4.5  Chloride 96 - 106 mmol/L 101 102 -  CO2 20 - 29 mmol/L 23 23 -  Calcium 8.7 - 10.2 mg/dL 9.4 9.1 -  Total Protein 6.0 - 8.5 g/dL 7.4 7.3 -  Total Bilirubin 0.0 - 1.2 mg/dL 0.4 0.5 -  Alkaline Phos 39 - 117 IU/L 62 57 54  AST 0 - 40 IU/L 17 11 13   ALT 0 - 32 IU/L 14 11 16    CBC Latest Ref Rng & Units 05/12/2019 05/08/2018 09/04/2017  WBC 3.4 - 10.8 x10E3/uL 4.9 3.8 4.3  Hemoglobin 11.1 - 15.9 g/dL 12.0 11.9 12.2  Hematocrit 34.0 -  46.6 % 37.4 37.2 38  Platelets 150 - 450 x10E3/uL 332 328 365   Lipid Panel     Component Value Date/Time   CHOL 211 (H) 05/12/2019 1411   TRIG 49 05/12/2019 1411   HDL 73 05/12/2019 1411   CHOLHDL 2.9 05/12/2019 1411   LDLCALC 129 (H) 05/12/2019 1411       NHDL CHOL                                                 134  HEMOGLOBIN A1C Lab Results  Component Value Date   HGBA1C 5.5 05/12/2019   TSH Recent Labs    05/12/19 1411  TSH 2.520   Medications and allergies   Allergies  Allergen Reactions  . Sulfa Antibiotics Anaphylaxis    Current Outpatient Medications  Medication Instructions  . aspirin-acetaminophen-caffeine (EXCEDRIN MIGRAINE) 250-250-65 MG per tablet 1-2 tablets, Oral, Every 6 hours PRN  . b complex vitamins capsule 1 capsule, Oral, Daily  . budesonide-formoterol (SYMBICORT) 80-4.5 MCG/ACT inhaler 2 puffs, Inhalation, As needed  . cetirizine (ZYRTEC) 10 mg, Oral, As needed   . Cholecalciferol (VITAMIN D PO) 1 tablet, Oral, Daily, 2000units  . cyclobenzaprine (FLEXERIL) 5 mg, Oral, Daily at bedtime  . ibuprofen (ADVIL) 800 mg, Oral, Daily PRN  . magnesium oxide (MAG-OX) 400 (241.3 Mg) MG tablet magnesium oxide 400 mg tablet  TAKE 1 TABLET WITH DAILY WITH EVENING MEAL  . meclizine (ANTIVERT) 25 mg, Oral, 3 times daily PRN  . mometasone (NASONEX) 50 MCG/ACT nasal spray 2 sprays, Nasal, Daily  . Omega-3 1,000 mg, Oral, Daily  . ondansetron (ZOFRAN-ODT) 4 mg, Oral, Every 8 hours PRN, And dizziness and motion intolerance  . PROAIR HFA 108 (90 Base) MCG/ACT inhaler TAKE 2 PUFFS BY MOUTH EVERY 4 TO 6 HOURS AS NEEDED  . rizatriptan (MAXALT) 5 mg, Oral, Once PRN, May repeat in 2 hours if needed  . Ubrelvy 50 mg, Oral, Daily PRN  . vitamin C 100 mg, Oral, Daily   Radiology:   No results found.  Cardiac Studies:   Echo 08/20/11: Normal Echocardiogram. Trace MR, TR, PI.   Treadmill stress test  [01/09/2016]: Indications: Palpitations The resting electrocardiogram demonstrated normal sinus rhythm, normal resting conduction, no resting arrhythmias and normal rest repolarization. The stress electrocardiogram was normal. There were no significant arrhythmias. Patient exercised on Bruce protocol for 8.00 minutes and achieved 89% of Max Predicted HR (Target HR was >85% MPHR) and 10.11 METS. Stress symptoms included fatigue and dyspnea. Normal BP response. Exercise capacity was normal. Impression: Normal stress EKG.  Event monitor  12/13/2017 - 12/26/2017: Dominant rhythm sinus. Occasional PACs and PVCs. No atrial fibrillation, atrial flutter, SVT, high-grade AV block. Symptoms of skipped beats reported with or without PACs and PVCs.  Assessment     ICD-10-CM   1. Palpitations  R00.2 EKG 12-Lead  2. Mild hyperlipidemia  E78.5     EKG 09/04/2019: Normal sinus rhythm with rate of 67 beats minute, normal axis.  No evidence of ischemia, normal EKG.      No orders of the  defined types were placed in this encounter.   There are no discontinued medications.   Recommendations:   Jennifer Manning  is a 49 y.o.  fairly active to African-American female with history of palpitations, last seen by me in June 2019, family history of  heart disease, mother deceased at age 42 with MI presents here for repeat visit referred to me by Dr. Baird Cancer due to recurrence of palpitations.  She had noticed symptoms sometime in December 2020, however over the past 1 month symptoms have resolved or very mild.  Symptoms again suggest PACs and PVCs.  Physical examination is completely normal and she does not need any further cardiac work-up.  I reassured her.  In view of family history of premature artery disease, mild hyperlipidemia was discussed.  Her non-HDL cholesterol is minimally elevated at 134, do not think she needs a statin, we discussed use of Metamucil and also flaxseed flakes.  Continue primary prevention discussed at length, I will see her back on a as needed basis.  Adrian Prows, MD, [facility] 09/04/2019, 2:07 PM [facility]. [place] Office: [phone]

## 2019-09-15 ENCOUNTER — Ambulatory Visit: Payer: No Typology Code available for payment source | Attending: Internal Medicine

## 2019-09-15 DIAGNOSIS — Z20822 Contact with and (suspected) exposure to covid-19: Secondary | ICD-10-CM

## 2019-09-16 LAB — NOVEL CORONAVIRUS, NAA: SARS-CoV-2, NAA: NOT DETECTED

## 2019-10-31 ENCOUNTER — Other Ambulatory Visit: Payer: Self-pay | Admitting: Nurse Practitioner

## 2019-11-16 ENCOUNTER — Other Ambulatory Visit: Payer: Self-pay

## 2019-11-16 MED ORDER — UBRELVY 50 MG PO TABS: 50.0000 mg | ORAL_TABLET | Freq: Every day | ORAL | 0 refills | Status: DC | PRN

## 2019-11-17 ENCOUNTER — Telehealth: Payer: Self-pay

## 2019-11-17 NOTE — Telephone Encounter (Signed)
Pt and pharm notified of approval of rybelsus cost to pt is $30

## 2019-12-02 ENCOUNTER — Ambulatory Visit: Payer: BC Managed Care – PPO | Admitting: Nurse Practitioner

## 2019-12-23 ENCOUNTER — Ambulatory Visit: Payer: BC Managed Care – PPO | Admitting: Internal Medicine

## 2019-12-23 ENCOUNTER — Other Ambulatory Visit: Payer: Self-pay

## 2019-12-23 ENCOUNTER — Encounter: Payer: Self-pay | Admitting: Internal Medicine

## 2019-12-23 VITALS — BP 116/70 | HR 80 | Temp 98.3°F | Ht 58.6 in | Wt 128.0 lb

## 2019-12-23 DIAGNOSIS — D1809 Hemangioma of other sites: Secondary | ICD-10-CM

## 2019-12-23 DIAGNOSIS — G43909 Migraine, unspecified, not intractable, without status migrainosus: Secondary | ICD-10-CM | POA: Diagnosis not present

## 2019-12-23 DIAGNOSIS — R233 Spontaneous ecchymoses: Secondary | ICD-10-CM | POA: Diagnosis not present

## 2019-12-23 DIAGNOSIS — M79671 Pain in right foot: Secondary | ICD-10-CM | POA: Diagnosis not present

## 2019-12-23 DIAGNOSIS — Z79899 Other long term (current) drug therapy: Secondary | ICD-10-CM

## 2019-12-23 NOTE — Patient Instructions (Signed)
Vascular Malformation  A vascular malformation is a defect in the development of a blood or lymph vessel. Vascular malformations are present at birth, but they may not cause signs or symptoms until years later. Vascular malformations can occur anywhere that blood or lymph circulates in your body. Lymph is fluid that is part of the body's disease-fighting (immune) system. There are four types of vascular malformations. Venous malformations These affect the veins, which are blood vessels that carry blood back to the heart. These are the most common type. Arteriovenous malformations These involve veins and arteries. Arteries are blood vessels that carry oxygen-rich blood away from the heart. Lymphatic malformations These affect lymph vessels. These vessels carry fluid through the body that helps to fight infections. Capillary malformations These affect capillaries, which are tiny blood vessels that connect veins to arteries. What are the causes? This condition is caused by an error in development that occurs before birth. In most cases, the cause of the error is not known. In some cases, it may be caused by a change in a gene (mutation). These mutations can be passed down through families (inherited). What are the signs or symptoms? The main symptom of this condition is a skin blemish or lump that swells, bleeds, or is painful. Other signs and symptoms depend on the type, location, and size of the malformation.  Venous malformations. These commonly appear on the face, arms, legs, or the area between the neck and groin (torso). They may have a bluish appearance.  Arteriovenous malformations. These have different symptoms depending on where they grow in the body. ? Brain malformations may cause a headache or seizure. They can also cause weakness, dizziness, confusion, loss of speech, or other symptoms of a bleeding (hemorrhagic) stroke. These malformations can be life-threatening. ? Spinal cord  malformations may cause back pain, weakness, or inability to feel or move (paralysis). ? Chest malformations may cause shortness of breath, fatigue, a cough, or coughing up blood. ? Malformations in the head or neck may cause vision changes or difficulty with breathing or swallowing.  Lymphatic malformations. These cause swelling on the face and neck.  Capillary malformations. These may look like a purple stain (port-wine stain) on the face. Over time, the stain may become darker and the skin may become thicker. How is this diagnosed? This condition may be diagnosed based on:  A physical exam. Some malformations can be seen or felt on the body.  Tests that create detailed images of the body. These may include: ? Ultrasound. This test uses sound waves to make images. ? MRI. This test is used to diagnose deep vascular malformations. ? CT scan. This uses X-rays to make a three-dimensional image. ? Angiogram. This is a type of X-ray that is taken after a dye is injected into an artery. The dye travels to the malformation. This imaging is most important for diagnosing arteriovenous malformations. How is this treated? Treatment depends on the type, size, and location of the malformation as well as on any symptoms. In some cases, treatment may not be needed. The malformation will be checked often to see if it is growing. If treatment is needed, the options include:  Laser treatment. This may be used to treat malformations near the skin. This is often the best treatment for capillary malformations.  Embolization. This treatment uses a glue substance or a particle that is inserted into a blood vessel that supplies a malformation. This blocks blood flow and shrinks the malformation.  Sclerotherapy. This involves inserting  an irritating substance into a blood vessel or lymphatic vessel. This causes the vessel to shrink and become a scar.  Radiosurgery. This is an X-ray treatment to destroy a specific  spot or to shrink a malformation. You may have this treatment to treat arteriovenous malformations in the brain or spinal cord.  Surgery. Some vascular malformations can be removed surgically. You may have other treatments to shrink the malformation before surgery. Follow these instructions at home:  Take over-the-counter and prescription medicines only as told by your health care provider.  Return to your normal activities as told by your health care provider. Ask your health care provider what activities are safe for you.  Keep all follow-up visits as told by your health care provider. This is important. Contact a health care provider if:  You have a fever.  You have pain.  You have a vascular malformation near your skin that changes size, bleeds, or becomes painful.  You have difficulty swallowing.  You have a headache, backache, or loss of feeling (numbness).  You have a cough.  You have dizziness, weakness, or confusion. Get help right away if:   You have trouble breathing.  You have severe bleeding.  You cough up blood.  You have a severe headache or back pain.  You have loss of movement.  You have any symptoms of a stroke. "BE FAST" is an easy way to remember the main warning signs of a stroke: ? B - Balance. Signs are dizziness, sudden trouble walking, or loss of balance. ? E - Eyes. Signs are trouble seeing or a sudden change in vision. ? F - Face. Signs are sudden weakness or numbness of the face, or the face or eyelid drooping on one side. ? A - Arms. Signs are weakness or numbness in an arm. This happens suddenly and usually on one side of the body. ? S - Speech. Signs are sudden trouble speaking, slurred speech, or trouble understanding what people say. ? T - Time. Time to call emergency services. Write down what time symptoms started.  You have other signs of a stroke, such as: ? A sudden, severe headache with no known cause. ? Nausea or  vomiting. ? Seizure. Summary  A vascular malformation is an abnormal development of a blood or lymph vessel that you are born with.  Symptoms depend on the type, size, and location of the malformation that you have.  You might not develop symptoms for many years.  Imaging studies are needed to diagnose vascular malformations inside the body.  There are many options for treatment. The best treatment will depend on the type, size, symptoms, and location of the malformation. This information is not intended to replace advice given to you by your health care provider. Make sure you discuss any questions you have with your health care provider. Document Revised: 11/20/2017 Document Reviewed: 04/04/2017 Elsevier Patient Education  [place].

## 2019-12-24 LAB — CBC WITH DIFFERENTIAL/PLATELET
Basophils Absolute: 0 10*3/uL (ref 0.0–0.2)
Basos: 1 %
EOS (ABSOLUTE): 0.2 10*3/uL (ref 0.0–0.4)
Eos: 3 %
Hematocrit: 35.2 % (ref 34.0–46.6)
Hemoglobin: 11.2 g/dL (ref 11.1–15.9)
Immature Grans (Abs): 0 10*3/uL (ref 0.0–0.1)
Immature Granulocytes: 0 %
Lymphocytes Absolute: 2.3 10*3/uL (ref 0.7–3.1)
Lymphs: 40 %
MCH: 28.3 pg (ref 26.6–33.0)
MCHC: 31.8 g/dL (ref 31.5–35.7)
MCV: 89 fL (ref 79–97)
Monocytes Absolute: 0.4 10*3/uL (ref 0.1–0.9)
Monocytes: 7 %
Neutrophils Absolute: 2.9 10*3/uL (ref 1.4–7.0)
Neutrophils: 49 %
Platelets: 363 10*3/uL (ref 150–450)
RBC: 3.96 x10E6/uL (ref 3.77–5.28)
RDW: 13.6 % (ref 11.7–15.4)
WBC: 5.8 10*3/uL (ref 3.4–10.8)

## 2019-12-24 LAB — BMP8+EGFR
BUN/Creatinine Ratio: 18 (ref 9–23)
BUN: 13 mg/dL (ref 6–24)
CO2: 23 mmol/L (ref 20–29)
Calcium: 9 mg/dL (ref 8.7–10.2)
Chloride: 104 mmol/L (ref 96–106)
Creatinine, Ser: 0.73 mg/dL (ref 0.57–1.00)
GFR calc Af Amer: 110 mL/min/{1.73_m2} (ref >59)
GFR calc non Af Amer: 96 mL/min/{1.73_m2} (ref >59)
Glucose: 79 mg/dL (ref 65–99)
Potassium: 4.3 mmol/L (ref 3.5–5.2)
Sodium: 138 mmol/L (ref 134–144)

## 2019-12-24 LAB — URIC ACID: Uric Acid: 4 mg/dL (ref 3.0–7.2)

## 2019-12-24 LAB — ABO AND RH: Rh Factor: POSITIVE

## 2019-12-24 LAB — ANTINUCLEAR ANTIBODIES, IFA: ANA Titer 1: NEGATIVE

## 2019-12-27 NOTE — Progress Notes (Signed)
This visit occurred during the SARS-CoV-2 public health emergency.  Safety protocols were in place, including screening questions prior to the visit, additional usage of staff PPE, and extensive cleaning of exam room while observing appropriate contact time as indicated for disinfecting solutions.  Subjective:     Patient ID: Jennifer Manning , female    DOB: 11-08-1970 , 49 y.o.   MRN: [id number]   Chief Complaint  Patient presents with  . Migraine    HPI  She is here today for f/u migraines. She was given sample of Ubrelvy at her last visit. She has yet to try it, she reports not having any moderate - severe headaches since her last visit.     Past Medical History:  Diagnosis Date  . Migraines   . Post concussive syndrome      Family History  Problem Relation Age of Onset  . Heart Problems Mother   . Hyperlipidemia Mother   . Hypertension Mother   . Hyperlipidemia Father   . Hyperlipidemia Brother   . Hypertension Brother   . Dementia Neg Hx      Current Outpatient Medications:  .  albuterol (VENTOLIN HFA) 108 (90 Base) MCG/ACT inhaler, INHALE 2 PUFFS BY MOUTH EVERY 4 TO 6 HOURS AS NEEDED, Disp: 8.5 g, Rfl: 2 .  Ascorbic Acid (VITAMIN C) 100 MG tablet, Take 100 mg by mouth daily., Disp: , Rfl:  .  aspirin-acetaminophen-caffeine (EXCEDRIN MIGRAINE) 250-250-65 MG per tablet, Take 1-2 tablets by mouth every 6 (six) hours as needed for headache., Disp: , Rfl:  .  b complex vitamins capsule, Take 1 capsule by mouth daily., Disp: , Rfl:  .  cetirizine (ZYRTEC) 10 MG tablet, Take 10 mg by mouth as needed. , Disp: , Rfl:  .  Cholecalciferol (VITAMIN D PO), Take 1 tablet by mouth daily. 2000units, Disp: , Rfl:  .  cyclobenzaprine (FLEXERIL) 5 MG tablet, Take 1 tablet (5 mg total) by mouth at bedtime. (Patient taking differently: Take 5 mg by mouth as needed. ), Disp: 90 tablet, Rfl: 4 .  ibuprofen (ADVIL) 800 MG tablet, Take 1 tablet (800 mg total) by mouth daily as needed., Disp:  30 tablet, Rfl: 0 .  magnesium oxide (MAG-OX) 400 (241.3 Mg) MG tablet, magnesium oxide 400 mg tablet  TAKE 1 TABLET WITH DAILY WITH EVENING MEAL, Disp: , Rfl:  .  meclizine (ANTIVERT) 25 MG tablet, Take 1 tablet (25 mg total) by mouth 3 (three) times daily as needed for dizziness., Disp: 30 tablet, Rfl: 6 .  mometasone (NASONEX) 50 MCG/ACT nasal spray, Place 2 sprays into the nose daily. (Patient taking differently: Place 2 sprays into the nose as needed. ), Disp: 17 g, Rfl: 2 .  Omega-3 1000 MG CAPS, Take 1,000 mg by mouth daily., Disp: , Rfl:  .  ondansetron (ZOFRAN-ODT) 4 MG disintegrating tablet, Take 1 tablet (4 mg total) by mouth every 8 (eight) hours as needed for nausea. And dizziness and motion intolerance, Disp: 30 tablet, Rfl: 3 .  SYMBICORT 80-4.5 MCG/ACT inhaler, TAKE 2 PUFFS BY MOUTH TWICE A DAY IN THE MORNING AND IN THE EVENING, Disp: 10.2 Inhaler, Rfl: 2 .  Ubrogepant (UBRELVY) 50 MG TABS, Take 50 mg by mouth daily as needed., Disp: 12 tablet, Rfl: 0 .  rizatriptan (MAXALT) 5 MG tablet, Take 1 tablet (5 mg total) by mouth once as needed for up to 30 days for migraine. May repeat in 2 hours if needed, Disp: 9 tablet, Rfl: 2  Allergies  Allergen Reactions  . Other Anaphylaxis  . Sulfa Antibiotics Anaphylaxis     Review of Systems  Constitutional: Negative.   Respiratory: Negative.   Cardiovascular: Negative.   Gastrointestinal: Negative.   Musculoskeletal: Positive for arthralgias.       Has r foot pain. There is some pain with ambulation. Has been evaluated by Ortho at [facility]. She adds that a vein bulges out between her toes on occasion. Unable to determine what triggers her sx.   Psychiatric/Behavioral: Negative.      Today's Vitals   12/23/19 1557  BP: 116/70  Pulse: 80  Temp: 98.3 F (36.8 C)  TempSrc: Oral  Weight: 128 lb (58.1 kg)  Height: 4' 10.6" (1.488 m)   Body mass index is 26.21 kg/m.   Objective:  Physical Exam Vitals and nursing note  reviewed.  Constitutional:      Appearance: Normal appearance.  HENT:     Head: Normocephalic and atraumatic.  Cardiovascular:     Rate and Rhythm: Normal rate and regular rhythm.     Heart sounds: Normal heart sounds.  Pulmonary:     Effort: Pulmonary effort is normal.     Breath sounds: Normal breath sounds.  Feet:     Comments: No venous abnormality noted.  Skin:    General: Skin is warm.  Neurological:     General: No focal deficit present.     Mental Status: She is alert.  Psychiatric:        Mood and Affect: Mood normal.        Behavior: Behavior normal.         Assessment And Plan:     1. Migraine without status migrainosus, not intractable, unspecified migraine type  Chronic, yet stable. She will use Ubrelvy as needed.   2. Spontaneous ecchymoses  I will check labs as listed below. I will make further recommendations once her labs are available for review.   - CBC with Diff  3. Foot pain, right  I will check labs as listed below. I will make further recommendations once her labs are available for review. Records from [place] were reviewed in full detail, as well as her MRI results. I answered her questions to the best of my ability. She is encouraged to discuss her condition further at her next Ortho appointment.  - Uric acid - ANA, IFA (with reflex)  4. Hemangioma of other sites  Seen on MRI R foot.  She plans to discuss further management at next Ortho visit.   5. Drug therapy  - BMP8+EGFR - ABO AND RH         Maximino Greenland, MD    THE PATIENT IS ENCOURAGED TO PRACTICE SOCIAL DISTANCING DUE TO THE COVID-19 PANDEMIC.

## 2020-01-23 ENCOUNTER — Other Ambulatory Visit: Payer: Self-pay | Admitting: Nurse Practitioner

## 2020-04-11 ENCOUNTER — Encounter: Payer: Self-pay | Admitting: Internal Medicine

## 2020-04-11 ENCOUNTER — Other Ambulatory Visit: Payer: Self-pay

## 2020-04-11 ENCOUNTER — Ambulatory Visit: Payer: BC Managed Care – PPO | Admitting: Internal Medicine

## 2020-04-11 VITALS — BP 120/84 | HR 74 | Temp 98.6°F | Ht 58.6 in | Wt 130.2 lb

## 2020-04-11 DIAGNOSIS — M25522 Pain in left elbow: Secondary | ICD-10-CM

## 2020-04-11 DIAGNOSIS — M25521 Pain in right elbow: Secondary | ICD-10-CM

## 2020-04-11 DIAGNOSIS — G43709 Chronic migraine without aura, not intractable, without status migrainosus: Secondary | ICD-10-CM | POA: Diagnosis not present

## 2020-04-11 MED ORDER — CYCLOBENZAPRINE HCL 5 MG PO TABS: ORAL_TABLET | ORAL | 0 refills | Status: DC

## 2020-04-11 NOTE — Patient Instructions (Signed)
Elbow Bursitis Bursitis is swelling and pain at the tip of your elbow. This happens when fluid builds up in a sac under your skin (bursa). This may also be called olecranon bursitis. Follow these instructions at home: Medicines  Take over-the-counter and prescription medicines only as told by your doctor.  If you were prescribed an antibiotic, take it exactly as told by your doctor. Do not stop taking it even if you start to feel better. Managing pain, stiffness, and swelling   If told, put ice on your elbow: ? Put ice in a plastic bag. ? Place a towel between your skin and the bag. ? Leave the ice on for 20 minutes, 2-3 times a day.  If your bursitis is caused by an injury, follow instructions from your doctor about: ? Resting your elbow. ? Wearing a bandage.  Wear elbow pads or elbow wraps as needed. These help cushion your elbow. General instructions  Avoid any activities that cause elbow pain. Ask your doctor what activities are safe for you.  Keep all follow-up visits as told by your doctor. This is important. Contact a doctor if you have:  A fever.  Problems that do not get better with treatment.  Pain or swelling that: ? Gets worse. ? Goes away and then comes back.  Pus draining from your elbow. Get help right away if you have:  Trouble moving your arm, hand, or fingers. Summary  Bursitis is swelling and pain at the tip of the elbow.  You may need to take medicine or put ice on your elbow.  Contact your doctor if your problems do not get better with treatment. This information is not intended to replace advice given to you by your health care provider. Make sure you discuss any questions you have with your health care provider. Document Revised: 06/21/2017 Document Reviewed: 06/18/2017 Elsevier Patient Education  2020 [employer] Inc[employer]  

## 2020-04-11 NOTE — Progress Notes (Signed)
I,Katawbba Wiggins,acting as a Education administrator for Maximino Greenland, MD.,have documented all relevant documentation on the behalf of Maximino Greenland, MD,as directed by  Maximino Greenland, MD while in the presence of Maximino Greenland, MD.  This visit occurred during the SARS-CoV-2 public health emergency.  Safety protocols were in place, including screening questions prior to the visit, additional usage of staff PPE, and extensive cleaning of exam room while observing appropriate contact time as indicated for disinfecting solutions.  Subjective:     Patient ID: Jennifer Manning , female    DOB: 1970/10/12 , 49 y.o.   MRN: [id number]   Chief Complaint  Patient presents with  . Elbow Pain    bilateral    HPI  The patient is here today for evaluation of bilateral elbow pain.  She reports her sx started about 3 weeks ago. Denies fall/trauma.  She is not sure what may have triggered her pain. She did try some ibuprofen with some relief of her sx.  She went to gym today, was unable to lift weights higher than 5 pounds due to the pain.     Past Medical History:  Diagnosis Date  . Migraines   . Post concussive syndrome      Family History  Problem Relation Age of Onset  . Heart Problems Mother   . Hyperlipidemia Mother   . Hypertension Mother   . Hyperlipidemia Father   . Hyperlipidemia Brother   . Hypertension Brother   . Dementia Neg Hx      Current Outpatient Medications:  .  albuterol (VENTOLIN HFA) 108 (90 Base) MCG/ACT inhaler, INHALE 2 PUFFS BY MOUTH EVERY 4 TO 6 HOURS AS NEEDED, Disp: 8.5 g, Rfl: 2 .  Ascorbic Acid (VITAMIN C) 100 MG tablet, Take 100 mg by mouth daily., Disp: , Rfl:  .  aspirin-acetaminophen-caffeine (EXCEDRIN MIGRAINE) 250-250-65 MG per tablet, Take 1-2 tablets by mouth every 6 (six) hours as needed for headache., Disp: , Rfl:  .  b complex vitamins capsule, Take 1 capsule by mouth daily., Disp: , Rfl:  .  cetirizine (ZYRTEC) 10 MG tablet, Take 10 mg by mouth as needed.  , Disp: , Rfl:  .  Cholecalciferol (VITAMIN D PO), Take 1 tablet by mouth daily. 2000units, Disp: , Rfl:  .  cyclobenzaprine (FLEXERIL) 5 MG tablet, One tab po qpm prn, Disp: 30 tablet, Rfl: 0 .  ibuprofen (ADVIL) 800 MG tablet, Take 1 tablet (800 mg total) by mouth daily as needed., Disp: 30 tablet, Rfl: 0 .  magnesium oxide (MAG-OX) 400 (241.3 Mg) MG tablet, magnesium oxide 400 mg tablet  TAKE 1 TABLET WITH DAILY WITH EVENING MEAL, Disp: , Rfl:  .  meclizine (ANTIVERT) 25 MG tablet, Take 1 tablet (25 mg total) by mouth 3 (three) times daily as needed for dizziness., Disp: 30 tablet, Rfl: 6 .  mometasone (NASONEX) 50 MCG/ACT nasal spray, Place 2 sprays into the nose daily. (Patient taking differently: Place 2 sprays into the nose as needed. ), Disp: 17 g, Rfl: 2 .  Omega-3 1000 MG CAPS, Take 1,000 mg by mouth daily., Disp: , Rfl:  .  ondansetron (ZOFRAN-ODT) 4 MG disintegrating tablet, Take 1 tablet (4 mg total) by mouth every 8 (eight) hours as needed for nausea. And dizziness and motion intolerance, Disp: 30 tablet, Rfl: 3 .  SYMBICORT 80-4.5 MCG/ACT inhaler, TAKE 2 PUFFS BY MOUTH TWICE A DAY IN THE MORNING AND IN THE EVENING, Disp: 30.6 Inhaler, Rfl: 1 .  Ubrogepant (UBRELVY) 50 MG TABS, Take 50 mg by mouth daily as needed., Disp: 12 tablet, Rfl: 0   Allergies  Allergen Reactions  . Other Anaphylaxis  . Sulfa Antibiotics Anaphylaxis     Review of Systems  Constitutional: Negative.   Respiratory: Negative.   Cardiovascular: Negative.   Gastrointestinal: Negative.   Musculoskeletal:       Bilateral elbow pain for about 3 mths.  Psychiatric/Behavioral: Negative.   All other systems reviewed and are negative.    Today's Vitals   04/11/20 1459  BP: 120/84  Pulse: 74  Temp: 98.6 F (37 C)  TempSrc: Oral  Weight: 130 lb 3.2 oz (59.1 kg)  Height: 4' 10.6" (1.488 m)  PainSc: 3   PainLoc: Elbow   Body mass index is 26.66 kg/m.   Objective:  Physical Exam Vitals and nursing  note reviewed.  Constitutional:      Appearance: Normal appearance. She is obese.  HENT:     Head: Normocephalic and atraumatic.  Cardiovascular:     Rate and Rhythm: Normal rate and regular rhythm.     Heart sounds: Normal heart sounds.  Pulmonary:     Breath sounds: Normal breath sounds.  Musculoskeletal:     Comments: Both UE examined - no elbow abnormality noted. No overlying erythema. There is some tenderness to palpation of epicondyles.   Skin:    General: Skin is warm.  Neurological:     General: No focal deficit present.     Mental Status: She is alert and oriented to person, place, and time.         Assessment And Plan:     1. Pain of both elbows Comments: ADvised to apply Voltaren gel to b/l elbows twice daily. also advised to get elbow sleeves. She is encouraged to f/u next week to give me an update on her sx.   2. Chronic migraine without aura without status migrainosus, not intractable Comments: Chronic. She was given refill of cyclobenzaprine, 5mg  to take nightly as needed. Also given samples of Ubrelvy to use prn. Encouraged to continue with magnesium supplementation.      Patient was given opportunity to ask questions. Patient verbalized understanding of the plan and was able to repeat key elements of the plan. All questions were answered to their satisfaction.  Maximino Greenland, MD   I, Maximino Greenland, MD, have reviewed all documentation for this visit. The documentation on 04/18/20 for the exam, diagnosis, procedures, and orders are all accurate and complete.  THE PATIENT IS ENCOURAGED TO PRACTICE SOCIAL DISTANCING DUE TO THE COVID-19 PANDEMIC.

## 2020-04-18 ENCOUNTER — Encounter: Payer: Self-pay | Admitting: Internal Medicine

## 2020-05-26 ENCOUNTER — Encounter: Payer: Self-pay | Admitting: Internal Medicine

## 2020-06-28 ENCOUNTER — Ambulatory Visit: Payer: BC Managed Care – PPO | Admitting: Nurse Practitioner

## 2020-06-28 ENCOUNTER — Other Ambulatory Visit: Payer: Self-pay

## 2020-06-28 ENCOUNTER — Ambulatory Visit
Admission: RE | Admit: 2020-06-28 | Discharge: 2020-06-28 | Disposition: A | Discharge status: Home / Self Care | Payer: BC Managed Care – PPO | Source: Ambulatory Visit | Attending: Nurse Practitioner | Admitting: Nurse Practitioner

## 2020-06-28 ENCOUNTER — Encounter: Payer: Self-pay | Admitting: Nurse Practitioner

## 2020-06-28 VITALS — BP 114/76 | HR 63 | Temp 98.3°F | Ht 58.2 in | Wt 129.0 lb

## 2020-06-28 DIAGNOSIS — J3489 Other specified disorders of nose and nasal sinuses: Secondary | ICD-10-CM | POA: Diagnosis not present

## 2020-06-28 DIAGNOSIS — R0602 Shortness of breath: Secondary | ICD-10-CM | POA: Diagnosis not present

## 2020-06-28 DIAGNOSIS — R059 Cough, unspecified: Secondary | ICD-10-CM

## 2020-06-28 MED ORDER — ALBUTEROL SULFATE HFA 108 (90 BASE) MCG/ACT IN AERS: 2.0000 | INHALATION_SPRAY | Freq: Four times a day (QID) | RESPIRATORY_TRACT | 2 refills | Status: DC | PRN

## 2020-06-28 NOTE — Progress Notes (Signed)
I,Tianna Badgett,acting as a Education administrator for [employer], FNP.,have documented all relevant documentation on the behalf of [name], FNP,as directed by  [name], FNP while in the presence of [name], [place].  This visit occurred during the SARS-CoV-2 public health emergency.  Safety protocols were in place, including screening questions prior to the visit, additional usage of staff PPE, and extensive cleaning of exam room while observing appropriate contact time as indicated for disinfecting solutions.  Subjective:     Patient ID: Jennifer Manning , female    DOB: May 17, 1971 , 49 y.o.   MRN: [id number]   Chief Complaint  Patient presents with  . Sinus Problem    HPI  Patient is here for sinus pressure. She is also having some shortness of breath and using her (Symbicort) inhaler more frequently. She does not feel that she has covid.  She states the breathing is worse, with her having difficulty with getting air out. She has been seeing a cardiologist for family history of heart problems and she has been having irregular heart beats. Denies runny nose, congestion or fever or exposure to anyone with covid. She has not been using her albuterol inhaler. She does have a history of pneumonia in the past. Reports having a moist cough.  She does not taken any medications for allergies. Denies pain when taking a deep breath.      Past Medical History:  Diagnosis Date  . Migraines   . Post concussive syndrome      Family History  Problem Relation Age of Onset  . Heart Problems Mother   . Hyperlipidemia Mother   . Hypertension Mother   . Hyperlipidemia Father   . Hyperlipidemia Brother   . Hypertension Brother   . Dementia Neg Hx      Current Outpatient Medications:  .  albuterol (VENTOLIN HFA) 108 (90 Base) MCG/ACT inhaler, Inhale 2 puffs into the lungs every 6 (six) hours as needed for wheezing or shortness of breath., Disp: 8.5 g, Rfl: 2 .  Ascorbic Acid (VITAMIN C) 100 MG  tablet, Take 100 mg by mouth daily., Disp: , Rfl:  .  aspirin-acetaminophen-caffeine (EXCEDRIN MIGRAINE) 250-250-65 MG per tablet, Take 1-2 tablets by mouth every 6 (six) hours as needed for headache., Disp: , Rfl:  .  b complex vitamins capsule, Take 1 capsule by mouth daily., Disp: , Rfl:  .  cetirizine (ZYRTEC) 10 MG tablet, Take 10 mg by mouth as needed. , Disp: , Rfl:  .  Cholecalciferol (VITAMIN D PO), Take 1 tablet by mouth daily. 2000units, Disp: , Rfl:  .  cyclobenzaprine (FLEXERIL) 5 MG tablet, One tab po qpm prn, Disp: 30 tablet, Rfl: 0 .  ibuprofen (ADVIL) 800 MG tablet, Take 1 tablet (800 mg total) by mouth daily as needed., Disp: 30 tablet, Rfl: 0 .  magnesium oxide (MAG-OX) 400 (241.3 Mg) MG tablet, magnesium oxide 400 mg tablet  TAKE 1 TABLET WITH DAILY WITH EVENING MEAL, Disp: , Rfl:  .  meclizine (ANTIVERT) 25 MG tablet, Take 1 tablet (25 mg total) by mouth 3 (three) times daily as needed for dizziness., Disp: 30 tablet, Rfl: 6 .  mometasone (NASONEX) 50 MCG/ACT nasal spray, Place 2 sprays into the nose daily. (Patient taking differently: Place 2 sprays into the nose as needed. ), Disp: 17 g, Rfl: 2 .  Omega-3 1000 MG CAPS, Take 1,000 mg by mouth daily., Disp: , Rfl:  .  ondansetron (ZOFRAN-ODT) 4 MG disintegrating tablet, Take 1 tablet (4  mg total) by mouth every 8 (eight) hours as needed for nausea. And dizziness and motion intolerance, Disp: 30 tablet, Rfl: 3 .  SYMBICORT 80-4.5 MCG/ACT inhaler, TAKE 2 PUFFS BY MOUTH TWICE A DAY IN THE MORNING AND IN THE EVENING, Disp: 30.6 Inhaler, Rfl: 1 .  Ubrogepant (UBRELVY) 50 MG TABS, Take 50 mg by mouth daily as needed., Disp: 12 tablet, Rfl: 0   Allergies  Allergen Reactions  . Other Anaphylaxis  . Sulfa Antibiotics Anaphylaxis     Review of Systems  Constitutional: Negative.  Negative for fatigue and fever.  Respiratory: Positive for cough (productive at times) and shortness of breath (at rest and at times with walking but not  consistent).   Cardiovascular: Negative.   Gastrointestinal: Negative.   Neurological: Negative.   Psychiatric/Behavioral: Negative.      Today's Vitals   06/28/20 1220  BP: 114/76  Pulse: 63  Temp: 98.3 F (36.8 C)  TempSrc: Oral  SpO2: 95%  Weight: 129 lb (58.5 kg)  Height: 4' 10.2" (1.478 m)   Body mass index is 26.78 kg/m.  Wt Readings from Last 3 Encounters:  06/28/20 129 lb (58.5 kg)  04/11/20 130 lb 3.2 oz (59.1 kg)  12/23/19 128 lb (58.1 kg)     Objective:  Physical Exam Constitutional:      General: She is not in acute distress.    Appearance: Normal appearance.  HENT:     Head: Normocephalic.     Right Ear: Tympanic membrane, ear canal and external ear normal. There is no impacted cerumen.     Left Ear: Tympanic membrane, ear canal and external ear normal. There is no impacted cerumen.     Nose: Congestion present. No nasal tenderness.     Right Sinus: No maxillary sinus tenderness or frontal sinus tenderness.     Left Sinus: No maxillary sinus tenderness or frontal sinus tenderness.  Cardiovascular:     Rate and Rhythm: Normal rate and regular rhythm.     Pulses: Normal pulses.     Heart sounds: Normal heart sounds. No murmur heard.   Pulmonary:     Effort: Pulmonary effort is normal. No respiratory distress.     Breath sounds: Normal breath sounds. No wheezing.  Neurological:     General: No focal deficit present.     Mental Status: She is alert and oriented to person, place, and time.     Cranial Nerves: No cranial nerve deficit.  Psychiatric:        Mood and Affect: Mood normal.        Behavior: Behavior normal.        Thought Content: Thought content normal.        Judgment: Judgment normal.         Assessment And Plan:     1. Sinus pressure  Will check for covid and she is to continue with her allergy medication  No tenderness to sinus areas  2. Cough  Will test for covid due to onset of symptoms and check CXR due to history of  pneumonia  - DG Chest 2 View; Future - Novel Coronavirus, NAA ([facility]) - albuterol (VENTOLIN HFA) 108 (90 Base) MCG/ACT inhaler; Inhale 2 puffs into the lungs every 6 (six) hours as needed for wheezing or shortness of breath.  Dispense: 8.5 g; Refill: 2  3. Shortness of breath  No wheezing with auscultation, will refill her albuterol inhaler  If symptoms worsen she is to go to ER for evaluation  Negative rapid flu and covid - Novel Coronavirus, NAA ([facility]) - albuterol (VENTOLIN HFA) 108 (90 Base) MCG/ACT inhaler; Inhale 2 puffs into the lungs every 6 (six) hours as needed for wheezing or shortness of breath.  Dispense: 8.5 g; Refill: 2     Patient was given opportunity to ask questions. Patient verbalized understanding of the plan and was able to repeat key elements of the plan. All questions were answered to their satisfaction.   Teola Bradley, FNP, have reviewed all documentation for this visit. The documentation on 06/28/20 for the exam, diagnosis, procedures, and orders are all accurate and complete.   THE PATIENT IS ENCOURAGED TO PRACTICE SOCIAL DISTANCING DUE TO THE COVID-19 PANDEMIC.

## 2020-06-30 LAB — SARS-COV-2, NAA 2 DAY TAT

## 2020-06-30 LAB — NOVEL CORONAVIRUS, NAA: SARS-CoV-2, NAA: NOT DETECTED

## 2020-07-01 ENCOUNTER — Encounter: Payer: Self-pay | Admitting: Nurse Practitioner

## 2020-07-17 LAB — POCT INFLUENZA A/B
Influenza A, POC: NEGATIVE
Influenza B, POC: NEGATIVE

## 2020-07-17 LAB — POC COVID19 BINAXNOW: SARS Coronavirus 2 Ag: NEGATIVE

## 2020-08-09 ENCOUNTER — Encounter: Payer: Self-pay | Admitting: Nurse Practitioner

## 2020-08-17 ENCOUNTER — Telehealth (INDEPENDENT_AMBULATORY_CARE_PROVIDER_SITE_OTHER): Payer: BC Managed Care – PPO | Admitting: Nurse Practitioner

## 2020-08-17 ENCOUNTER — Telehealth: Payer: Self-pay

## 2020-08-17 ENCOUNTER — Encounter: Payer: Self-pay | Admitting: Nurse Practitioner

## 2020-08-17 ENCOUNTER — Other Ambulatory Visit: Payer: Self-pay

## 2020-08-17 VITALS — Temp 103.0°F

## 2020-08-17 DIAGNOSIS — U071 COVID-19: Secondary | ICD-10-CM

## 2020-08-17 DIAGNOSIS — R058 Other specified cough: Secondary | ICD-10-CM | POA: Diagnosis not present

## 2020-08-17 DIAGNOSIS — Z20822 Contact with and (suspected) exposure to covid-19: Secondary | ICD-10-CM

## 2020-08-17 MED ORDER — AZITHROMYCIN 250 MG PO TABS: ORAL_TABLET | ORAL | 0 refills | Status: AC

## 2020-08-17 NOTE — Telephone Encounter (Signed)
The pt scheduled a mychart visit because she is covid positive.

## 2020-08-17 NOTE — Progress Notes (Signed)
Virtual Visit via video call    This visit type was conducted due to national recommendations for restrictions regarding the COVID-19 Pandemic (e.g. social distancing) in an effort to limit this patient's exposure and mitigate transmission in our community.  Due to her co-morbid illnesses, this patient is at least at moderate risk for complications without adequate follow up.  This format is felt to be most appropriate for this patient at this time.  All issues noted in this document were discussed and addressed.  A limited physical exam was performed with this format.    This visit type was conducted due to national recommendations for restrictions regarding the COVID-19 Pandemic (e.g. social distancing) in an effort to limit this patient's exposure and mitigate transmission in our community.  Patients identity confirmed using two different identifiers.  This format is felt to be most appropriate for this patient at this time.  All issues noted in this document were discussed and addressed.  No physical exam was performed (except for noted visual exam findings with Video Visits).    Date:  08/17/2020   ID:  Jennifer Manning, DOB [birth year], MRN [id number]  Patient Location:  Home   Provider location:   Office    Chief Complaint: Positive for Covid. She is having low grade fever, fatigue, cough, fatigue No shortness of breath.   History of Present Illness:    Jennifer Manning is a 50 y.o. female who presents via video conferencing for a telehealth visit today.    The patient have symptoms concerning for COVID-19 infection.   She got tested on Friday. At home test came back positive. She took another test at [facility] and came in positive. She got booster shot. She had symptoms from the booster shot. Not sure if she was positive before getting the booster shot and then she got the booster shot which made her feel really bad. She has a HA, low grade fever, She lost her sense of smell and taste but  she is slowly getting that back. She has taken Mucinex DM. She has not been eating much. She had some soup. No shortness of breath. She has had a low grade fever off and on.       Past Medical History:  Diagnosis Date  . Migraines   . Post concussive syndrome    Past Surgical History:  Procedure Laterality Date  . TONSILLECTOMY    . TUBAL LIGATION       Current Meds  Medication Sig  . azithromycin (ZITHROMAX) 250 MG tablet Take 2 tablets (500 mg) on  Day 1,  followed by 1 tablet (250 mg) once daily on Days 2 through 5.     Allergies:   Other and Sulfa antibiotics   Social History   Tobacco Use  . Smoking status: Never Smoker  . Smokeless tobacco: Never Used  Vaping Use  . Vaping Use: Never used  Substance Use Topics  . Alcohol use: No  . Drug use: No     Family Hx: The patient's family history includes Heart Problems in her mother; Hyperlipidemia in her brother, father, and mother; Hypertension in her brother and mother. There is no history of Dementia.  ROS:   Please see the history of present illness.    Review of Systems  Constitutional: Positive for fever and malaise/fatigue.  HENT: Positive for congestion.   Respiratory: Positive for cough and sputum production.   Cardiovascular: Negative for chest pain and palpitations.  Chest pain with coughing   Gastrointestinal: Negative for constipation and diarrhea.  Musculoskeletal: Positive for myalgias.  Neurological: Negative for dizziness.    All other systems reviewed and are negative.   Labs/Other Tests and Data Reviewed:    Recent Labs: 12/23/2019: BUN 13; Creatinine, Ser 0.73; Hemoglobin 11.2; Platelets 363; Potassium 4.3; Sodium 138   Recent Lipid Panel Lab Results  Component Value Date/Time   CHOL 211 (H) 05/12/2019 02:11 PM   TRIG 49 05/12/2019 02:11 PM   HDL 73 05/12/2019 02:11 PM   CHOLHDL 2.9 05/12/2019 02:11 PM   LDLCALC 129 (H) 05/12/2019 02:11 PM    Wt Readings from Last 3 Encounters:   06/28/20 129 lb (58.5 kg)  04/11/20 130 lb 3.2 oz (59.1 kg)  12/23/19 128 lb (58.1 kg)     Exam:    Vital Signs:  Temp (!) 103 F (39.4 C) (Oral)     Physical Exam Constitutional:      General: She is not in acute distress.    Appearance: Normal appearance. She is ill-appearing.  HENT:     Head: Normocephalic.  Pulmonary:     Effort: Pulmonary effort is normal.  Neurological:     Mental Status: She is alert.    ASSESSMENT & PLAN:     There are no diagnoses linked to this encounter.   1)  Lab test positive for detection of Covid virus  -Advised patient to take Vitamin C, D, Zinc.  Keep herself hydrated with a lot of water and rest. Take Delsym for cough and Mucinex. Take Tylenol or pain reliever every 4-6 hours as needed for pain/fever/body ache. - Educated patient if symptoms get worse or if she experiences any SOB or pain in her legs to seek immediate emergency care.  -Continue to monitor your pulse oxygen. Call us if you have any questions. Quarantine for 5 days if tested positive and no symptoms or 10 days if tested positive and have symptoms. Wear a mask around other people.  -azithromycin (5 day course) sent to [place] patient if her symptoms do not improve or get better by tom to give [place] a call by tomorrow or Friday.    2) Cough with exposure to Covid 19 virus  -Educated patient to take OTC delsym as needed.  -Educated patient to monitor her symptoms her symptoms and oxygen. -Educated patient to continue to use her albuterol inhaler as needed.  -Sent prescription of Azithromycin (5 day course) to [place] patient if her symptoms get worse to give [place] a call.   COVID-19 Education: The signs and symptoms of COVID-19 were discussed with the patient and how to seek care for testing (follow up with PCP or arrange E-visit).  The importance of social distancing was discussed today.  Patient Risk:   After full review of this patients clinical status,  I feel that they are at least moderate risk at this time.  Time:   Today, I have spent 20 minutes/ seconds with the patient with telehealth technology discussing above diagnoses.     Medication Adjustments/Labs and Tests Ordered: Current medicines are reviewed at length with the patient today.  Concerns regarding medicines are outlined above.   Tests Ordered: No orders of the defined types were placed in this encounter.   Medication Changes: Meds ordered this encounter  Medications  . azithromycin (ZITHROMAX) 250 MG tablet    Sig: Take 2 tablets (500 mg) on  Day 1,  followed by 1 tablet (250  mg) once daily on Days 2 through 5.    Dispense:  6 each    Refill:  0    Disposition:  Follow up if symptoms do not resolve or get worse.   Signed, Bary Castilla, NP

## 2020-08-24 ENCOUNTER — Telehealth: Payer: Self-pay

## 2020-08-24 NOTE — Telephone Encounter (Signed)
The pt was notified to go urgent care for evaluation.  The pt said that she was getting very short of breath just walking, that she has been nauseated and feeling dizzy. The pt was told that they will evaluate the pt for dehydration also and would be able to administer fluids.

## 2020-08-25 ENCOUNTER — Telehealth: Payer: Self-pay

## 2020-08-25 NOTE — Telephone Encounter (Signed)
The pt called and wanted to let the office know that she went to urgent care yesterday, was told that she has pneumonia and that they prescribed amoxicillin and Augmentin.  The pt said she is feeling a little better.

## 2020-08-30 ENCOUNTER — Encounter: Payer: Self-pay | Admitting: Internal Medicine

## 2020-08-30 ENCOUNTER — Ambulatory Visit (INDEPENDENT_AMBULATORY_CARE_PROVIDER_SITE_OTHER): Payer: BC Managed Care – PPO | Admitting: Internal Medicine

## 2020-08-30 ENCOUNTER — Other Ambulatory Visit: Payer: Self-pay

## 2020-08-30 VITALS — BP 112/64 | HR 55 | Temp 98.1°F | Ht 59.0 in | Wt 122.2 lb

## 2020-08-30 DIAGNOSIS — Z8616 Personal history of COVID-19: Secondary | ICD-10-CM

## 2020-08-30 DIAGNOSIS — G43909 Migraine, unspecified, not intractable, without status migrainosus: Secondary | ICD-10-CM | POA: Diagnosis not present

## 2020-08-30 DIAGNOSIS — R202 Paresthesia of skin: Secondary | ICD-10-CM

## 2020-08-30 DIAGNOSIS — Z1211 Encounter for screening for malignant neoplasm of colon: Secondary | ICD-10-CM

## 2020-08-30 DIAGNOSIS — Z0000 Encounter for general adult medical examination without abnormal findings: Secondary | ICD-10-CM

## 2020-08-30 MED ORDER — CYANOCOBALAMIN 1000 MCG/ML IJ SOLN
1000.0000 ug | Freq: Once | INTRAMUSCULAR | Status: AC
  Administered 2020-08-30: 1000 ug via INTRAMUSCULAR

## 2020-08-30 NOTE — Progress Notes (Signed)
I,Katawbba Wiggins,acting as a Education administrator for Maximino Greenland, MD.,have documented all relevant documentation on the behalf of Maximino Greenland, MD,as directed by  Maximino Greenland, MD while in the presence of Maximino Greenland, MD.  This visit occurred during the SARS-CoV-2 public health emergency.  Safety protocols were in place, including screening questions prior to the visit, additional usage of staff PPE, and extensive cleaning of exam room while observing appropriate contact time as indicated for disinfecting solutions.  Subjective:     Patient ID: Jennifer Manning , female    DOB: 1970-07-30 , 50 y.o.   MRN: [id number]   Chief Complaint  Patient presents with  . Annual Exam    HPI  She is here today for a full physical examination. She is followed by Dr. Cletis Media for her Gyn exams. She has recovered from her recent COVID infection. She reports being diagnosed on 1/20 - she experienced fever, chills, cough, sinus drainage and b/l ear pressure. She adds that she has had exacerbation of her migraines since having COVD.  Unfortunately, after a week she did not feel any better so she went to Urgent Care for further evaluation. She was diagnosed with pneumonia and prescribed amoxicillin and prednisone. She reports her sx have improved since starting these medications. She has yet to return to work.     Past Medical History:  Diagnosis Date  . Migraines   . Post concussive syndrome      Family History  Problem Relation Age of Onset  . Heart Problems Mother   . Hyperlipidemia Mother   . Hypertension Mother   . Hyperlipidemia Father   . Hyperlipidemia Brother   . Hypertension Brother   . Dementia Neg Hx      Current Outpatient Medications:  .  amoxicillin-clavulanate (AUGMENTIN) 875-125 MG tablet, Take 1 tablet by mouth 2 (two) times daily., Disp: , Rfl:  .  Ascorbic Acid (VITAMIN C) 100 MG tablet, Take 100 mg by mouth daily., Disp: , Rfl:  .  aspirin-acetaminophen-caffeine (EXCEDRIN  MIGRAINE) 250-250-65 MG per tablet, Take 1-2 tablets by mouth every 6 (six) hours as needed for headache., Disp: , Rfl:  .  B Complex Vitamins (VITAMIN B COMPLEX 100 IJ), , Disp: , Rfl:  .  b complex vitamins capsule, Take 1 capsule by mouth daily., Disp: , Rfl:  .  cetirizine (ZYRTEC) 10 MG tablet, Take 10 mg by mouth as needed. , Disp: , Rfl:  .  Cholecalciferol (VITAMIN D PO), Take 1 tablet by mouth daily. 2000units, Disp: , Rfl:  .  cyclobenzaprine (FLEXERIL) 5 MG tablet, One tab po qpm prn, Disp: 30 tablet, Rfl: 0 .  ibuprofen (ADVIL) 800 MG tablet, Take 1 tablet (800 mg total) by mouth daily as needed., Disp: 30 tablet, Rfl: 0 .  mometasone (NASONEX) 50 MCG/ACT nasal spray, Place 2 sprays into the nose daily. (Patient taking differently: Place 2 sprays into the nose as needed.), Disp: 17 g, Rfl: 2 .  Omega-3 1000 MG CAPS, Take 1,000 mg by mouth daily., Disp: , Rfl:  .  ondansetron (ZOFRAN-ODT) 4 MG disintegrating tablet, Take 1 tablet (4 mg total) by mouth every 8 (eight) hours as needed for nausea. And dizziness and motion intolerance, Disp: 30 tablet, Rfl: 3 .  SYMBICORT 80-4.5 MCG/ACT inhaler, TAKE 2 PUFFS BY MOUTH TWICE A DAY IN THE MORNING AND IN THE EVENING, Disp: 30.6 Inhaler, Rfl: 1 .  Ubrogepant (UBRELVY) 50 MG TABS, Take 50 mg by mouth daily as  needed., Disp: 12 tablet, Rfl: 0 .  Zinc 10 MG LOZG, , Disp: , Rfl:  .  albuterol (VENTOLIN HFA) 108 (90 Base) MCG/ACT inhaler, Inhale 2 puffs into the lungs every 6 (six) hours as needed for wheezing or shortness of breath., Disp: 8.5 g, Rfl: 2 .  magnesium oxide (MAG-OX) 400 (241.3 Mg) MG tablet, magnesium oxide 400 mg tablet  TAKE 1 TABLET WITH DAILY WITH EVENING MEAL (Patient not taking: Reported on 08/30/2020), Disp: , Rfl:  .  meclizine (ANTIVERT) 25 MG tablet, Take 1 tablet (25 mg total) by mouth 3 (three) times daily as needed for dizziness. (Patient not taking: Reported on 08/30/2020), Disp: 30 tablet, Rfl: 6 .  predniSONE (DELTASONE) 20  MG tablet, Take 40 mg by mouth daily. (Patient not taking: Reported on 08/30/2020), Disp: , Rfl:    Allergies  Allergen Reactions  . Other Anaphylaxis  . Sulfa Antibiotics Anaphylaxis      The patient states she uses tubal ligation for birth control. Last LMP was Patient's last menstrual period was 08/25/2020.. Negative for Dysmenorrhea. Negative for: breast discharge, breast lump(s), breast pain and breast self exam. Associated symptoms include abnormal vaginal bleeding. Pertinent negatives include abnormal bleeding (hematology), anxiety, decreased libido, depression, difficulty falling sleep, dyspareunia, history of infertility, nocturia, sexual dysfunction, sleep disturbances, urinary incontinence, urinary urgency, vaginal discharge and vaginal itching. Diet regular.The patient states her exercise level is  moderate.   . The patient's tobacco use is:  Social History   Tobacco Use  Smoking Status Never Smoker  Smokeless Tobacco Never Used  . She has been exposed to passive smoke. The patient's alcohol use is:  Social History   Substance and Sexual Activity  Alcohol Use No    Review of Systems  Constitutional: Negative.   HENT: Negative.   Eyes: Negative.   Respiratory: Negative.   Cardiovascular: Negative.   Gastrointestinal: Negative.   Endocrine: Negative.   Genitourinary: Negative.   Musculoskeletal: Negative.   Skin: Negative.   Allergic/Immunologic: Negative.   Neurological: Positive for numbness.       She c/o paresthesias in her hands/feet while on prednisone. She has completed full course of prednisone.   Hematological: Negative.   Psychiatric/Behavioral: Negative.      Today's Vitals   08/30/20 1416  BP: 112/64  Pulse: (!) 55  Temp: 98.1 F (36.7 C)  TempSrc: Oral  Weight: 122 lb 3.2 oz (55.4 kg)  Height: 4' 11"  (1.499 m)  PainSc: 0-No pain   Body mass index is 24.68 kg/m.   Objective:  Physical Exam Vitals and nursing note reviewed.  Constitutional:       General: She is not in acute distress.    Appearance: Normal appearance. She is well-developed.  HENT:     Head: Normocephalic and atraumatic.     Right Ear: Hearing, tympanic membrane, ear canal and external ear normal. There is no impacted cerumen.     Left Ear: Hearing, tympanic membrane, ear canal and external ear normal. There is no impacted cerumen.     Nose:     Comments: Deferred, masked    Mouth/Throat:     Comments: Deferred, masked Eyes:     General: Lids are normal.     Extraocular Movements: Extraocular movements intact.     Conjunctiva/sclera: Conjunctivae normal.  Neck:     Thyroid: No thyroid mass.     Vascular: No carotid bruit.  Cardiovascular:     Rate and Rhythm: Normal rate and regular rhythm.  Pulses: Normal pulses.     Heart sounds: Normal heart sounds. No murmur heard.   Pulmonary:     Effort: Pulmonary effort is normal.     Breath sounds: Normal breath sounds.  Chest:  Breasts:     Tanner Score is 5.     Right: Normal.     Left: Normal.    Abdominal:     General: Abdomen is flat. Bowel sounds are normal. There is no distension.     Palpations: Abdomen is soft.     Tenderness: There is no abdominal tenderness.  Musculoskeletal:        General: No swelling. Normal range of motion.     Cervical back: Full passive range of motion without pain, normal range of motion and neck supple.     Right lower leg: No edema.     Left lower leg: No edema.  Skin:    General: Skin is warm and dry.     Capillary Refill: Capillary refill takes less than 2 seconds.  Neurological:     General: No focal deficit present.     Mental Status: She is alert and oriented to person, place, and time.     Cranial Nerves: No cranial nerve deficit.     Sensory: No sensory deficit.  Psychiatric:        Mood and Affect: Mood normal.        Behavior: Behavior normal.        Thought Content: Thought content normal.        Judgment: Judgment normal.          Assessment And Plan:     1. Encounter for annual physical exam Comments: A full exam was performed. Importance of monthly self breast exams was discussed with the patient. PATIENT IS ADVISED TO GET 30-45 MINUTES REGULAR EXERCISE NO LESS THAN FOUR TO FIVE DAYS PER WEEK - BOTH WEIGHTBEARING EXERCISES AND AEROBIC ARE RECOMMENDED.  PATIENT IS ADVISED TO FOLLOW A HEALTHY DIET WITH AT LEAST SIX FRUITS/VEGGIES PER DAY, DECREASE INTAKE OF RED MEAT, AND TO INCREASE FISH INTAKE TO TWO DAYS PER WEEK.  MEATS/FISH SHOULD NOT BE FRIED, BAKED OR BROILED IS PREFERABLE.  I SUGGEST WEARING SPF 50 SUNSCREEN ON EXPOSED PARTS AND ESPECIALLY WHEN IN THE DIRECT SUNLIGHT FOR AN EXTENDED PERIOD OF TIME.  PLEASE AVOID FAST FOOD RESTAURANTS AND INCREASE YOUR WATER INTAKE.  - Hepatitis C antibody - CBC - CMP14+EGFR - Lipid panel  2. Migraine without status migrainosus, not intractable, unspecified migraine type Comments: She was given samples of Ubrelvy, she has taken this in the past with success. She was given both 29m/100mg dose - she will let me know which works best for her. She is also reminded to stay well hydrated.   3. Paresthesias Comments: I will check vitamin B12 level today.  She was also given vitamin B12 IM x 1 today.  - Vitamin B12 - cyanocobalamin ((VITAMIN B-12)) injection 1,000 mcg - Hemoglobin A1c  4. Personal history of COVID-19 Comments: She is fully vaccinated and boosted. She is encouraged to continue with vitamins C, D and zinc supplementation.   5. Screen for colon cancer Comments: She agrees to GI referral for CRC screening.  - Ambulatory referral to Gastroenterology  Patient was given opportunity to ask questions. Patient verbalized understanding of the plan and was able to repeat key elements of the plan. All questions were answered to their satisfaction.  RMaximino Greenland MD   I, RMaximino Greenland MD, have  reviewed all documentation for this visit. The documentation on 08/30/20 for  the exam, diagnosis, procedures, and orders are all accurate and complete.  THE PATIENT IS ENCOURAGED TO PRACTICE SOCIAL DISTANCING DUE TO THE COVID-19 PANDEMIC.

## 2020-08-30 NOTE — Patient Instructions (Signed)
Health Maintenance, Female Adopting a healthy lifestyle and getting preventive care are important in promoting health and wellness. Ask your health care provider about:  The right schedule for you to have regular tests and exams.  Things you can do on your own to prevent diseases and keep yourself healthy. What should I know about diet, weight, and exercise? Eat a healthy diet  Eat a diet that includes plenty of vegetables, fruits, low-fat dairy products, and lean protein.  Do not eat a lot of foods that are high in solid fats, added sugars, or sodium.   Maintain a healthy weight Body mass index (BMI) is used to identify weight problems. It estimates body fat based on height and weight. Your health care provider can help determine your BMI and help you achieve or maintain a healthy weight. Get regular exercise Get regular exercise. This is one of the most important things you can do for your health. Most adults should:  Exercise for at least 150 minutes each week. The exercise should increase your heart rate and make you sweat (moderate-intensity exercise).  Do strengthening exercises at least twice a week. This is in addition to the moderate-intensity exercise.  Spend less time sitting. Even light physical activity can be beneficial. Watch cholesterol and blood lipids Have your blood tested for lipids and cholesterol at 52 years of age, then have this test every 5 years. Have your cholesterol levels checked more often if:  Your lipid or cholesterol levels are high.  You are older than 52 years of age.  You are at high risk for heart disease. What should I know about cancer screening? Depending on your health history and family history, you may need to have cancer screening at various ages. This may include screening for:  Breast cancer.  Cervical cancer.  Colorectal cancer.  Skin cancer.  Lung cancer. What should I know about heart disease, diabetes, and high blood  pressure? Blood pressure and heart disease  High blood pressure causes heart disease and increases the risk of stroke. This is more likely to develop in people who have high blood pressure readings, are of African descent, or are overweight.  Have your blood pressure checked: ? Every 3-5 years if you are 18-39 years of age. ? Every year if you are 40 years old or older. Diabetes Have regular diabetes screenings. This checks your fasting blood sugar level. Have the screening done:  Once every three years after age 40 if you are at a normal weight and have a low risk for diabetes.  More often and at a younger age if you are overweight or have a high risk for diabetes. What should I know about preventing infection? Hepatitis B If you have a higher risk for hepatitis B, you should be screened for this virus. Talk with your health care provider to find out if you are at risk for hepatitis B infection. Hepatitis C Testing is recommended for:  Everyone born from 1945 through 1965.  Anyone with known risk factors for hepatitis C. Sexually transmitted infections (STIs)  Get screened for STIs, including gonorrhea and chlamydia, if: ? You are sexually active and are younger than 52 years of age. ? You are older than 52 years of age and your health care provider tells you that you are at risk for this type of infection. ? Your sexual activity has changed since you were last screened, and you are at increased risk for chlamydia or gonorrhea. Ask your health care provider   if you are at risk.  Ask your health care provider about whether you are at high risk for HIV. Your health care provider may recommend a prescription medicine to help prevent HIV infection. If you choose to take medicine to prevent HIV, you should first get tested for HIV. You should then be tested every 3 months for as long as you are taking the medicine. Pregnancy  If you are about to stop having your period (premenopausal) and  you may become pregnant, seek counseling before you get pregnant.  Take 400 to 800 micrograms (mcg) of folic acid every day if you become pregnant.  Ask for birth control (contraception) if you want to prevent pregnancy. Osteoporosis and menopause Osteoporosis is a disease in which the bones lose minerals and strength with aging. This can result in bone fractures. If you are 65 years old or older, or if you are at risk for osteoporosis and fractures, ask your health care provider if you should:  Be screened for bone loss.  Take a calcium or vitamin D supplement to lower your risk of fractures.  Be given hormone replacement therapy (HRT) to treat symptoms of menopause. Follow these instructions at home: Lifestyle  Do not use any products that contain nicotine or tobacco, such as cigarettes, e-cigarettes, and chewing tobacco. If you need help quitting, ask your health care provider.  Do not use street drugs.  Do not share needles.  Ask your health care provider for help if you need support or information about quitting drugs. Alcohol use  Do not drink alcohol if: ? Your health care provider tells you not to drink. ? You are pregnant, may be pregnant, or are planning to become pregnant.  If you drink alcohol: ? Limit how much you use to 0-1 drink a day. ? Limit intake if you are breastfeeding.  Be aware of how much alcohol is in your drink. In the [place] one drink equals one 12 oz bottle of beer (355 mL), one 5 oz glass of wine (148 mL), or one 1 oz glass of hard liquor (44 mL). General instructions  Schedule regular health, dental, and eye exams.  Stay current with your vaccines.  Tell your health care provider if: ? You often feel depressed. ? You have ever been abused or do not feel safe at home. Summary  Adopting a healthy lifestyle and getting preventive care are important in promoting health and wellness.  Follow your health care provider's instructions about healthy  diet, exercising, and getting tested or screened for diseases.  Follow your health care provider's instructions on monitoring your cholesterol and blood pressure. This information is not intended to replace advice given to you by your health care provider. Make sure you discuss any questions you have with your health care provider. Document Revised: 07/02/2018 Document Reviewed: 07/02/2018 Elsevier Patient Education  2021 [employer]  

## 2020-08-31 LAB — CBC
Hematocrit: 34.7 % (ref 34.0–46.6)
Hemoglobin: 10.9 g/dL — ABNORMAL LOW (ref 11.1–15.9)
MCH: 27 pg (ref 26.6–33.0)
MCHC: 31.4 g/dL — ABNORMAL LOW (ref 31.5–35.7)
MCV: 86 fL (ref 79–97)
Platelets: 401 10*3/uL (ref 150–450)
RBC: 4.03 x10E6/uL (ref 3.77–5.28)
RDW: 15.5 % — ABNORMAL HIGH (ref 11.7–15.4)
WBC: 7.5 10*3/uL (ref 3.4–10.8)

## 2020-08-31 LAB — CMP14+EGFR
ALT: 10 IU/L (ref 0–32)
AST: 14 IU/L (ref 0–40)
Albumin/Globulin Ratio: 1.3 (ref 1.2–2.2)
Albumin: 4 g/dL (ref 3.8–4.9)
Alkaline Phosphatase: 54 IU/L (ref 44–121)
BUN/Creatinine Ratio: 15 (ref 9–23)
BUN: 12 mg/dL (ref 6–24)
Bilirubin Total: <0.2 mg/dL (ref 0.0–1.2)
CO2: 21 mmol/L (ref 20–29)
Calcium: 9 mg/dL (ref 8.7–10.2)
Chloride: 103 mmol/L (ref 96–106)
Creatinine, Ser: 0.79 mg/dL (ref 0.57–1.00)
GFR calc Af Amer: 100 mL/min/{1.73_m2} (ref >59)
GFR calc non Af Amer: 87 mL/min/{1.73_m2} (ref >59)
Globulin, Total: 3 g/dL (ref 1.5–4.5)
Glucose: 83 mg/dL (ref 65–99)
Potassium: 3.7 mmol/L (ref 3.5–5.2)
Sodium: 140 mmol/L (ref 134–144)
Total Protein: 7 g/dL (ref 6.0–8.5)

## 2020-08-31 LAB — HEMOGLOBIN A1C
Est. average glucose Bld gHb Est-mCnc: 114 mg/dL
Hgb A1c MFr Bld: 5.6 % (ref 4.8–5.6)

## 2020-08-31 LAB — LIPID PANEL
Chol/HDL Ratio: 3.8 ratio (ref 0.0–4.4)
Cholesterol, Total: 204 mg/dL — ABNORMAL HIGH (ref 100–199)
HDL: 54 mg/dL (ref >39)
LDL Chol Calc (NIH): 133 mg/dL — ABNORMAL HIGH (ref 0–99)
Triglycerides: 94 mg/dL (ref 0–149)
VLDL Cholesterol Cal: 17 mg/dL (ref 5–40)

## 2020-08-31 LAB — VITAMIN B12: Vitamin B-12: >2000 pg/mL — ABNORMAL HIGH (ref 232–1245)

## 2020-08-31 LAB — HEPATITIS C ANTIBODY: Hep C Virus Ab: <0.1 s/co ratio (ref 0.0–0.9)

## 2020-09-07 ENCOUNTER — Other Ambulatory Visit: Payer: Self-pay

## 2020-09-07 ENCOUNTER — Ambulatory Visit: Payer: BC Managed Care – PPO | Admitting: Cardiology

## 2020-09-07 ENCOUNTER — Encounter: Payer: Self-pay | Admitting: Cardiology

## 2020-09-07 VITALS — BP 114/63 | HR 68 | Temp 97.9°F | Resp 16 | Ht 59.0 in | Wt 122.6 lb

## 2020-09-07 DIAGNOSIS — Z8616 Personal history of COVID-19: Secondary | ICD-10-CM

## 2020-09-07 DIAGNOSIS — R0602 Shortness of breath: Secondary | ICD-10-CM

## 2020-09-07 DIAGNOSIS — R002 Palpitations: Secondary | ICD-10-CM

## 2020-09-07 DIAGNOSIS — R001 Bradycardia, unspecified: Secondary | ICD-10-CM

## 2020-09-07 NOTE — Progress Notes (Signed)
Primary Physician/Referring:  Jennifer Chard, MD  Patient ID: Jennifer Manning, female    DOB: 04-Nov-1970, 50 y.o.   MRN: [id number]  Chief Complaint  Patient presents with  . Shortness of Breath  . Tachycardia  . Follow-up   HPI:    Jennifer Manning  is a 50 y.o. fairly active to African-American female with history of palpitations, family history of heart disease, mother deceased at age 65 with MI presents here for repeat visit referred to me by Dr. Baird Manning due to recurrence of palpitations.  Diagnosed with Covid pneumonia in January 2020 and since then symptoms of palpitations have gotten worse.  She is also noticed marked decrease in exercise tolerance and also dyspnea and occasional dizziness.  No syncope.  No leg edema.  Mostly palpitations are occurring while she is resting.  She has now returned to full-time work.  Past Medical History:  Diagnosis Date  . Migraines   . Post concussive syndrome    Past Surgical History:  Procedure Laterality Date  . TONSILLECTOMY    . TUBAL LIGATION     Social History   Tobacco Use  . Smoking status: Never Smoker  . Smokeless tobacco: Never Used  Substance Use Topics  . Alcohol use: No   ROS  Review of Systems  Constitutional: Positive for malaise/fatigue.  Cardiovascular: Positive for dyspnea on exertion and palpitations. Negative for chest pain.   Objective  Blood pressure 114/63, pulse 68, temperature 97.9 F (36.6 C), temperature source Temporal, resp. rate 16, height 4\' 11"  (1.499 m), weight 122 lb 9.6 oz (55.6 kg), last menstrual period 08/25/2020, SpO2 99 %.  Vitals with BMI 09/07/2020 08/30/2020 08/17/2020  Height 4\' 11"  4\' 11"  (No Data)  Weight 122 lbs 10 oz 122 lbs 3 oz (No Data)  BMI 78.29 56.21 -  Systolic 308 657 (No Data)  Diastolic 63 64 (No Data)  Pulse 68 55 (No Data)     Physical Exam Neck:     Thyroid: No thyromegaly.  Cardiovascular:     Rate and Rhythm: Normal rate and regular rhythm.     Pulses: Intact  distal pulses.     Heart sounds: Normal heart sounds. No murmur heard. No gallop.      Comments: No leg edema, no JVD. Pulmonary:     Effort: Pulmonary effort is normal. Bradypnea present.     Breath sounds: Normal breath sounds.  Abdominal:     General: Bowel sounds are normal.     Palpations: Abdomen is soft.  Musculoskeletal:     Cervical back: Neck supple.  Skin:    General: Skin is warm and dry.    Laboratory examination:   Recent Labs    12/23/19 1635 08/30/20 1618  NA 138 140  K 4.3 3.7  CL 104 103  CO2 23 21  GLUCOSE 79 83  BUN 13 12  CREATININE 0.73 0.79  CALCIUM 9.0 9.0  GFRNONAA 96 87  GFRAA 110 100   estimated creatinine clearance is 63.3 mL/min (by C-G formula based on SCr of 0.79 mg/dL).  CMP Latest Ref Rng & Units 08/30/2020 12/23/2019 05/12/2019  Glucose 65 - 99 mg/dL 83 79 79  BUN 6 - 24 mg/dL 12 13 12   Creatinine 0.57 - 1.00 mg/dL 0.79 0.73 0.68  Sodium 134 - 144 mmol/L 140 138 138  Potassium 3.5 - 5.2 mmol/L 3.7 4.3 4.0  Chloride 96 - 106 mmol/L 103 104 101  CO2 20 - 29 mmol/L 21 23 23  Calcium 8.7 - 10.2 mg/dL 9.0 9.0 9.4  Total Protein 6.0 - 8.5 g/dL 7.0 - 7.4  Total Bilirubin 0.0 - 1.2 mg/dL <0.2 - 0.4  Alkaline Phos 44 - 121 IU/L 54 - 62  AST 0 - 40 IU/L 14 - 17  ALT 0 - 32 IU/L 10 - 14   CBC Latest Ref Rng & Units 08/30/2020 12/23/2019 05/12/2019  WBC 3.4 - 10.8 x10E3/uL 7.5 5.8 4.9  Hemoglobin 11.1 - 15.9 g/dL 10.9(L) 11.2 12.0  Hematocrit 34.0 - 46.6 % 34.7 35.2 37.4  Platelets 150 - 450 x10E3/uL 401 363 332   Lipid Panel     Component Value Date/Time   CHOL 204 (H) 08/30/2020 1618   TRIG 94 08/30/2020 1618   HDL 54 08/30/2020 1618   CHOLHDL 3.8 08/30/2020 1618   LDLCALC 133 (H) 08/30/2020 1618       NHDL CHOL                                                 134  HEMOGLOBIN A1C Lab Results  Component Value Date   HGBA1C 5.6 08/30/2020   TSH No results for input(s): TSH in the last 8760 hours.   Medications and allergies    Allergies  Allergen Reactions  . Other Anaphylaxis  . Sulfa Antibiotics Anaphylaxis    Current Outpatient Medications on File Prior to Visit  Medication Sig Dispense Refill  . albuterol (VENTOLIN HFA) 108 (90 Base) MCG/ACT inhaler Inhale 2 puffs into the lungs every 6 (six) hours as needed for wheezing or shortness of breath. 8.5 g 2  . Ascorbic Acid (VITAMIN C) 100 MG tablet Take 100 mg by mouth daily.    Marland Kitchen aspirin-acetaminophen-caffeine (EXCEDRIN MIGRAINE) 250-250-65 MG per tablet Take 1-2 tablets by mouth every 6 (six) hours as needed for headache.    . B Complex Vitamins (VITAMIN B COMPLEX 100 IJ)     . cetirizine (ZYRTEC) 10 MG tablet Take 10 mg by mouth as needed.     . Cholecalciferol (VITAMIN D PO) Take 1 tablet by mouth daily. 2000units    . cyclobenzaprine (FLEXERIL) 5 MG tablet One tab po qpm prn 30 tablet 0  . ibuprofen (ADVIL) 800 MG tablet Take 1 tablet (800 mg total) by mouth daily as needed. 30 tablet 0  . magnesium oxide (MAG-OX) 400 (241.3 Mg) MG tablet     . mometasone (NASONEX) 50 MCG/ACT nasal spray Place 2 sprays into the nose daily. (Patient taking differently: Place 2 sprays into the nose as needed.) 17 g 2  . Omega-3 1000 MG CAPS Take 1,000 mg by mouth daily.    . SYMBICORT 80-4.5 MCG/ACT inhaler TAKE 2 PUFFS BY MOUTH TWICE A DAY IN THE MORNING AND IN THE EVENING 30.6 Inhaler 1  . Ubrogepant (UBRELVY) 50 MG TABS Take 50 mg by mouth daily as needed. 12 tablet 0  . Zinc 10 MG LOZG     . ondansetron (ZOFRAN-ODT) 4 MG disintegrating tablet Take 1 tablet (4 mg total) by mouth every 8 (eight) hours as needed for nausea. And dizziness and motion intolerance (Patient not taking: Reported on 09/07/2020) 30 tablet 3   No current facility-administered medications on file prior to visit.    Radiology:   Chest x-ray PA and lateral view 06/29/2020: The heart size and mediastinal contours are within normal limits. Both  lungs are clear. The visualized skeletal structures  are unremarkable. IMPRESSION: No active cardiopulmonary disease.  Cardiac Studies:   Echo 08/20/11: Normal Echocardiogram. Trace MR, TR, PI.   Treadmill stress test  [01/09/2016]: Indications: Palpitations The resting electrocardiogram demonstrated normal sinus rhythm, normal resting conduction, no resting arrhythmias and normal rest repolarization. The stress electrocardiogram was normal. There were no significant arrhythmias. Patient exercised on Bruce protocol for 8.00 minutes and achieved 89% of Max Predicted HR (Target HR was >85% MPHR) and 10.11 METS. Stress symptoms included fatigue and dyspnea. Normal BP response. Exercise capacity was normal. Impression: Normal stress EKG.  Event monitor  12/13/2017 - 12/26/2017: Dominant rhythm sinus. Occasional PACs and PVCs. No atrial fibrillation, atrial flutter, SVT, high-grade AV block. Symptoms of skipped beats reported with or without PACs and PVCs.  EKG:    EKG 09/07/2020: Marked sinus bradycardia at rate of 39 bpm with sinus arrhythmia.  Otherwise normal EKG.    EKG 09/04/2019: Normal sinus rhythm with rate of 67 beats minute, normal axis.  No evidence of ischemia, normal EKG.      Assessment     ICD-10-CM   1. Palpitations  R00.2   2. Shortness of breath  R06.02 EKG 12-Lead    PCV ECHOCARDIOGRAM COMPLETE    PCV CARDIAC STRESS TEST  3. Sinus bradycardia by electrocardiogram  R00.1 PCV ECHOCARDIOGRAM COMPLETE    PCV CARDIAC STRESS TEST  4. Personal history of COVID-19  Z86.16      No orders of the defined types were placed in this encounter.   Medications Discontinued During This Encounter  Medication Reason  . amoxicillin-clavulanate (AUGMENTIN) 875-125 MG tablet Completed Course  . b complex vitamins capsule Error  . meclizine (ANTIVERT) 25 MG tablet Error  . predniSONE (DELTASONE) 20 MG tablet Completed Course    Orders Placed This Encounter  Procedures  . PCV CARDIAC STRESS TEST    Standing Status:   Future     Standing Expiration Date:   11/05/2020  . EKG 12-Lead  . PCV ECHOCARDIOGRAM COMPLETE    Standing Status:   Future    Standing Expiration Date:   09/07/2021    Recommendations:   Jennifer Manning  is a 50 y.o.  fairly active to African-American female with history of palpitations, family history of heart disease, mother deceased at age 61 with MI presents here for repeat visit referred to me by Dr. Baird Manning due to recurrence of palpitations.  She was diagnosed with COVID-19 infection on 08/11/2020 presenting with fever, chills, cough, upper respiratory symptoms and eventually diagnosed with pneumonia.  Since then she has been having frequent episodes of palpitations.  She is also experiencing marked fatigue, decreased exercise tolerance, dyspnea on exertion.  Her symptoms are related to recent Covid pneumonia.  Her physical examination is unremarkable, lungs are clear and EKG except for marked sinus bradycardia do not see any abnormality.  Suspect the symptoms are related to recent Covid pneumonia and Covid exposure.  I will obtain an echocardiogram and also routine treadmill stress test.  I have reassured her and shared her data regarding vaccinated patients with cold infection having very low risk of myocarditis and thromboembolic complications.  Advised her to increase her activity slowly.  I given her a temporary handicap placard for 3 to 6 months.  I would like to see her back in 3 months for follow-up.    Adrian Prows, MD, [facility] 09/07/2020, 10:36 AM Office: [phone] Pager: [phone]

## 2020-09-09 ENCOUNTER — Encounter: Payer: Self-pay | Admitting: Internal Medicine

## 2020-09-15 ENCOUNTER — Other Ambulatory Visit: Payer: BC Managed Care – PPO

## 2020-09-19 ENCOUNTER — Other Ambulatory Visit: Payer: Self-pay

## 2020-09-19 ENCOUNTER — Ambulatory Visit: Payer: BC Managed Care – PPO

## 2020-09-19 DIAGNOSIS — R001 Bradycardia, unspecified: Secondary | ICD-10-CM

## 2020-09-19 DIAGNOSIS — R0602 Shortness of breath: Secondary | ICD-10-CM

## 2020-09-22 ENCOUNTER — Other Ambulatory Visit: Payer: BC Managed Care – PPO

## 2020-10-05 ENCOUNTER — Ambulatory Visit: Payer: BC Managed Care – PPO

## 2020-10-05 ENCOUNTER — Other Ambulatory Visit: Payer: Self-pay

## 2020-10-05 DIAGNOSIS — R0602 Shortness of breath: Secondary | ICD-10-CM

## 2020-10-05 DIAGNOSIS — R001 Bradycardia, unspecified: Secondary | ICD-10-CM

## 2020-10-11 ENCOUNTER — Encounter: Payer: Self-pay | Admitting: Internal Medicine

## 2020-10-27 ENCOUNTER — Telehealth: Payer: Self-pay

## 2020-10-27 NOTE — Telephone Encounter (Signed)
Patient called regarding her stress test I went ahead and told patient her test was normal patient is at lost because she feels "like something is still not right" she can be doing nothing and she feels her heart is beating too fast like "flutters" patient would like to have a monitor place please advise

## 2020-10-28 ENCOUNTER — Telehealth: Payer: Self-pay | Admitting: Student

## 2020-10-28 DIAGNOSIS — R002 Palpitations: Secondary | ICD-10-CM

## 2020-10-28 NOTE — Telephone Encounter (Signed)
Patient continues to have symptoms of palpitations, they are occurring more frequently, in fact now occurring daily.  These are causing patient significant concern.  We will therefore obtain 1 week Zio patch monitor.    ICD-10-CM   1. Palpitations  R00.2 LONG TERM MONITOR (3-14 DAYS)     Alethia Berthold, PA-C 10/28/2020, 2:58 PM Office: [phone]

## 2020-10-31 NOTE — Telephone Encounter (Signed)
It should be in the order. 7 days for palpitations.

## 2020-10-31 NOTE — Telephone Encounter (Signed)
How many days would you like for her to wear it and for what is she wearing it for

## 2020-10-31 NOTE — Telephone Encounter (Signed)
Sorry I should have checked I will let patient know

## 2020-11-03 ENCOUNTER — Telehealth: Payer: Self-pay

## 2020-11-03 NOTE — Telephone Encounter (Signed)
The pt called to schedule a an appt to get a vitamin b12 injection.  The pt was told that her vitb 12 was elevated at her last lab check and the pt said that it's because she got the shot and then lab work after.  The pt wants to know if she can get the lipo B.

## 2020-11-08 ENCOUNTER — Other Ambulatory Visit: Payer: Self-pay

## 2020-11-08 ENCOUNTER — Ambulatory Visit: Payer: BC Managed Care – PPO

## 2020-11-08 VITALS — BP 116/80 | HR 78 | Temp 98.1°F | Ht 59.0 in | Wt 128.2 lb

## 2020-11-08 DIAGNOSIS — E538 Deficiency of other specified B group vitamins: Secondary | ICD-10-CM

## 2020-11-08 NOTE — Progress Notes (Signed)
Pt here for lipo b12 injection.

## 2020-11-15 ENCOUNTER — Inpatient Hospital Stay: Payer: BC Managed Care – PPO

## 2020-11-15 DIAGNOSIS — R002 Palpitations: Secondary | ICD-10-CM

## 2020-11-18 ENCOUNTER — Telehealth: Payer: Self-pay

## 2020-11-18 NOTE — Telephone Encounter (Signed)
Prior auth done for ubrelvy 50 mg, waiting on a response from the pt's insurance.

## 2020-11-28 ENCOUNTER — Other Ambulatory Visit: Payer: Self-pay | Admitting: Internal Medicine

## 2020-11-28 LAB — HM MAMMOGRAPHY

## 2020-11-29 ENCOUNTER — Encounter: Payer: Self-pay | Admitting: Internal Medicine

## 2020-11-29 NOTE — Telephone Encounter (Signed)
ubrelvy

## 2020-12-07 ENCOUNTER — Other Ambulatory Visit: Payer: Self-pay

## 2020-12-07 ENCOUNTER — Ambulatory Visit: Payer: BC Managed Care – PPO | Admitting: Cardiology

## 2020-12-07 ENCOUNTER — Encounter: Payer: Self-pay | Admitting: Cardiology

## 2020-12-07 VITALS — BP 102/68 | HR 76 | Temp 98.2°F | Resp 16 | Ht 59.0 in | Wt 127.6 lb

## 2020-12-07 DIAGNOSIS — R002 Palpitations: Secondary | ICD-10-CM

## 2020-12-07 DIAGNOSIS — Z8616 Personal history of COVID-19: Secondary | ICD-10-CM

## 2020-12-07 DIAGNOSIS — R0602 Shortness of breath: Secondary | ICD-10-CM

## 2020-12-07 NOTE — Progress Notes (Signed)
Primary Physician/Referring:  Glendale Chard, MD  Patient ID: Jennifer Manning, female    DOB: 05-02-1971, 50 y.o.   MRN: [id number]  Chief Complaint  Patient presents with  . Shortness of Breath  . Bradycardia  . Follow-up    3 month   HPI:    Jennifer Manning  is a 50 y.o. fairly active to African-American female with history of palpitations, family history of heart disease, mother deceased at age 42 with MI presents here for repeat visit, I had seen her 2 months ago for palpitations, dyspnea on exertion, fatigue that started after her COVID infection in January 2021.  Symptoms are gradually improved.  She still has palpitations especially when she lays down on the left, each lasting few seconds.  She also has shortness of breath when she lays down on the left side.  She has also having mild fatigue.  But overall symptoms have gradually improved.     Past Medical History:  Diagnosis Date  . Migraines   . Post concussive syndrome    Past Surgical History:  Procedure Laterality Date  . TONSILLECTOMY    . TUBAL LIGATION     Social History   Tobacco Use  . Smoking status: Never Smoker  . Smokeless tobacco: Never Used  Substance Use Topics  . Alcohol use: No   ROS  Review of Systems  Constitutional: Positive for malaise/fatigue.  Cardiovascular: Positive for dyspnea on exertion and palpitations. Negative for chest pain.   Objective  Blood pressure 102/68, pulse 76, temperature 98.2 F (36.8 C), temperature source Temporal, resp. rate 16, height 4\' 11"  (1.499 m), weight 127 lb 9.6 oz (57.9 kg), SpO2 98 %.  Vitals with BMI 12/07/2020 11/08/2020 09/07/2020  Height 4\' 11"  4\' 11"  4\' 11"   Weight 127 lbs 10 oz 128 lbs 3 oz 122 lbs 10 oz  BMI 25.76 [id number]  Systolic [id number]  Diastolic 68 80 63  Pulse 76 78 68     Physical Exam Neck:     Thyroid: No thyromegaly.  Cardiovascular:     Rate and Rhythm: Normal rate and regular rhythm.     Pulses: Intact distal pulses.      Heart sounds: Normal heart sounds. No murmur heard. No gallop.      Comments: No leg edema, no JVD. Pulmonary:     Effort: Pulmonary effort is normal.     Breath sounds: Normal breath sounds.  Abdominal:     General: Bowel sounds are normal.     Palpations: Abdomen is soft.  Musculoskeletal:     Cervical back: Neck supple.  Skin:    General: Skin is warm and dry.    Laboratory examination:   Recent Labs    12/23/19 1635 08/30/20 1618  NA 138 140  K 4.3 3.7  CL 104 103  CO2 23 21  GLUCOSE 79 83  BUN 13 12  CREATININE 0.73 0.79  CALCIUM 9.0 9.0  GFRNONAA 96 87  GFRAA 110 100   CrCl cannot be calculated (Patient's most recent lab result is older than the maximum 21 days allowed.).  CMP Latest Ref Rng & Units 08/30/2020 12/23/2019 05/12/2019  Glucose 65 - 99 mg/dL 83 79 79  BUN 6 - 24 mg/dL 12 13 12   Creatinine 0.57 - 1.00 mg/dL 0.79 0.73 0.68  Sodium 134 - 144 mmol/L 140 138 138  Potassium 3.5 - 5.2 mmol/L 3.7 4.3 4.0  Chloride 96 - 106 mmol/L 103 104 101  CO2 20 - 29 mmol/L 21 23 23   Calcium 8.7 - 10.2 mg/dL 9.0 9.0 9.4  Total Protein 6.0 - 8.5 g/dL 7.0 - 7.4  Total Bilirubin 0.0 - 1.2 mg/dL <0.2 - 0.4  Alkaline Phos 44 - 121 IU/L 54 - 62  AST 0 - 40 IU/L 14 - 17  ALT 0 - 32 IU/L 10 - 14   CBC Latest Ref Rng & Units 08/30/2020 12/23/2019 05/12/2019  WBC 3.4 - 10.8 x10E3/uL 7.5 5.8 4.9  Hemoglobin 11.1 - 15.9 g/dL 10.9(L) 11.2 12.0  Hematocrit 34.0 - 46.6 % 34.7 35.2 37.4  Platelets 150 - 450 x10E3/uL 401 363 332   Lipid Panel     Component Value Date/Time   CHOL 204 (H) 08/30/2020 1618   TRIG 94 08/30/2020 1618   HDL 54 08/30/2020 1618   CHOLHDL 3.8 08/30/2020 1618   LDLCALC 133 (H) 08/30/2020 1618       NHDL CHOL                                                 134  HEMOGLOBIN A1C Lab Results  Component Value Date   HGBA1C 5.6 08/30/2020   TSH No results for input(s): TSH in the last 8760 hours.   Medications and allergies   Allergies  Allergen  Reactions  . Other Anaphylaxis  . Sulfa Antibiotics Anaphylaxis    Current Outpatient Medications on File Prior to Visit  Medication Sig Dispense Refill  . albuterol (VENTOLIN HFA) 108 (90 Base) MCG/ACT inhaler Inhale 2 puffs into the lungs every 6 (six) hours as needed for wheezing or shortness of breath. 8.5 g 2  . Ascorbic Acid (VITAMIN C) 100 MG tablet Take 100 mg by mouth daily.    Marland Kitchen aspirin-acetaminophen-caffeine (EXCEDRIN MIGRAINE) 250-250-65 MG per tablet Take 1-2 tablets by mouth every 6 (six) hours as needed for headache.    . B Complex Vitamins (VITAMIN B COMPLEX 100 IJ)     . cetirizine (ZYRTEC) 10 MG tablet Take 10 mg by mouth as needed.     . Cholecalciferol (VITAMIN D PO) Take 1 tablet by mouth daily. 2000units    . cyclobenzaprine (FLEXERIL) 5 MG tablet One tab po qpm prn 30 tablet 0  . ibuprofen (ADVIL) 800 MG tablet Take 1 tablet (800 mg total) by mouth daily as needed. 30 tablet 0  . magnesium oxide (MAG-OX) 400 (241.3 Mg) MG tablet     . mometasone (NASONEX) 50 MCG/ACT nasal spray Place 2 sprays into the nose daily. (Patient taking differently: Place 2 sprays into the nose as needed.) 17 g 2  . Omega-3 1000 MG CAPS Take 1,000 mg by mouth daily.    . progesterone (PROMETRIUM) 200 MG capsule Take by mouth.    . SYMBICORT 80-4.5 MCG/ACT inhaler TAKE 2 PUFFS BY MOUTH TWICE A DAY IN THE MORNING AND IN THE EVENING 30.6 Inhaler 1  . UBRELVY 50 MG TABS TAKE 1 TABLET BY MOUTH DAILY AS NEEDED. 12 tablet 1  . ondansetron (ZOFRAN-ODT) 4 MG disintegrating tablet Take 1 tablet (4 mg total) by mouth every 8 (eight) hours as needed for nausea. And dizziness and motion intolerance (Patient not taking: No sig reported) 30 tablet 3   No current facility-administered medications on file prior to visit.    Radiology:   Chest x-ray PA and lateral view 06/29/2020:  The heart size and mediastinal contours are within normal limits. Both lungs are clear. The visualized skeletal structures  are unremarkable. IMPRESSION: No active cardiopulmonary disease.  Cardiac Studies:   Echo 08/20/11: Normal Echocardiogram. Trace MR, TR, PI.   Treadmill stress test  [01/09/2016]: Indications: Palpitations The resting electrocardiogram demonstrated normal sinus rhythm, normal resting conduction, no resting arrhythmias and normal rest repolarization. The stress electrocardiogram was normal. There were no significant arrhythmias. Patient exercised on Bruce protocol for 8.00 minutes and achieved 89% of Max Predicted HR (Target HR was >85% MPHR) and 10.11 METS. Stress symptoms included fatigue and dyspnea. Normal BP response. Exercise capacity was normal. Impression: Normal stress EKG.  Event monitor  12/13/2017 - 12/26/2017: Dominant rhythm sinus. Occasional PACs and PVCs. No atrial fibrillation, atrial flutter, SVT, high-grade AV block. Symptoms of skipped beats reported with or without PACs and PVCs.  Echocardiogram 10/05/2020: Normal LV systolic function with visual EF 55-60%. Left ventricle cavity is normal in size. Normal global wall motion. Normal diastolic filling pattern, normal LAP.  Mild (Grade I) mitral regurgitation. Mild pulmonic regurgitation. Compared to prior study dated 08/20/2011: No significant change except Mild MR and PR are new.   Exercise treadmill stress test 09/19/2020: Exercise treadmill stress test performed using Bruce protocol.  Patient reached 8.9 METS, and 89% of age predicted maximum heart rate.  Exercise capacity was low normal.  No chest pain reported.  Exertional dyspnea reported. Normal heart rate and hemodynamic response. Stress EKG revealed no ischemic changes. Low risk study.   EKG:   EKG 12/07/2020: Normal sinus rhythm at rate of 66 bpm, normal axis.  Poor R wave progression, probably normal variant.  No evidence of ischemia, otherwise normal EKG.     EKG 09/07/2020: Marked sinus bradycardia at rate of 39 bpm with sinus arrhythmia.  Otherwise normal  EKG.    EKG 09/04/2019: Normal sinus rhythm with rate of 67 beats minute, normal axis.  No evidence of ischemia, normal EKG.      Assessment     ICD-10-CM   1. Shortness of breath  R06.02 EKG 12-Lead  2. Palpitations  R00.2   3. Personal history of COVID-19  Z86.16      No orders of the defined types were placed in this encounter.   Medications Discontinued During This Encounter  Medication Reason  . Zinc 10 MG LOZG Error    Orders Placed This Encounter  Procedures  . EKG 12-Lead    Recommendations:   Jennifer Manning  is a 50 y.o.  fairly active to African-American female with history of palpitations, family history of heart disease, mother deceased at age 75 with MI presents here for repeat visit referred to me by Dr. Baird Cancer due to recurrence of palpitations.  She was diagnosed with COVID-19 infection on 08/11/2020 presenting with fever, chills, cough, upper respiratory symptoms and eventually diagnosed with pneumonia.  Since then she has been having frequent episodes of palpitations.  She is also experiencing marked fatigue, decreased exercise tolerance, dyspnea on exertion.  Her symptoms are related to recent Covid pneumonia.    I had seen her 2 months ago, she underwent echocardiogram and also routine treadmill stress test and also wore an event monitor for 2 weeks.  She has started to feel better, she still has some shortness of breath and fatigue since COVID-19 infection.  I reassured her and stated that over time that she should start feeling better.  Also marked bradycardia that she had when I saw her previously  is now resolved.  I did not make any changes to her medications, I do not have her outpatient EKG monitoring as she just mailed the device, unless this is abnormal, I will see her back on a as needed basis.  I spent 20 minutes face-to-face with the patient encounter. I have reassured her and advised her to increase her physical activity slowly.  Her symptoms of dyspnea  and palpitations while she lays down on the left are very nonspecific.    Adrian Prows, MD, [facility] 12/07/2020, 1:28 PM Office: [phone] Pager: [phone]

## 2021-02-07 ENCOUNTER — Encounter: Payer: Self-pay | Admitting: Internal Medicine

## 2021-04-18 ENCOUNTER — Telehealth: Payer: Self-pay | Admitting: Student

## 2021-04-18 NOTE — Telephone Encounter (Signed)
Spoke to patient regarding concerns of renewing handicap parking placard.  Explained patient cardiovascular work-up was unremarkable and suspect patient's symptoms are related to history of COVID-pneumonia.  Advised her to follow-up with PCP.

## 2021-04-26 ENCOUNTER — Encounter: Payer: Self-pay | Admitting: Internal Medicine

## 2021-04-26 ENCOUNTER — Other Ambulatory Visit: Payer: Self-pay | Admitting: Student

## 2021-04-26 ENCOUNTER — Other Ambulatory Visit: Payer: Self-pay

## 2021-04-26 ENCOUNTER — Ambulatory Visit: Payer: BC Managed Care – PPO | Admitting: Internal Medicine

## 2021-04-26 VITALS — BP 112/80 | HR 65 | Temp 98.1°F | Ht 59.0 in | Wt 124.8 lb

## 2021-04-26 DIAGNOSIS — R5383 Other fatigue: Secondary | ICD-10-CM

## 2021-04-26 DIAGNOSIS — N920 Excessive and frequent menstruation with regular cycle: Secondary | ICD-10-CM | POA: Diagnosis not present

## 2021-04-26 DIAGNOSIS — R600 Localized edema: Secondary | ICD-10-CM

## 2021-04-26 DIAGNOSIS — R0602 Shortness of breath: Secondary | ICD-10-CM

## 2021-04-26 DIAGNOSIS — R002 Palpitations: Secondary | ICD-10-CM

## 2021-04-26 DIAGNOSIS — Z23 Encounter for immunization: Secondary | ICD-10-CM

## 2021-04-26 MED ORDER — ZOSTER VAC RECOMB ADJUVANTED 50 MCG/0.5ML IM SUSR: 0.5000 mL | Freq: Once | INTRAMUSCULAR | 0 refills | Status: AC

## 2021-04-26 NOTE — Progress Notes (Signed)
Rich Brave Llittleton,acting as a Education administrator for Maximino Greenland, MD.,have documented all relevant documentation on the behalf of Maximino Greenland, MD,as directed by  Maximino Greenland, MD while in the presence of Maximino Greenland, MD.  This visit occurred during the SARS-CoV-2 public health emergency.  Safety protocols were in place, including screening questions prior to the visit, additional usage of staff PPE, and extensive cleaning of exam room while observing appropriate contact time as indicated for disinfecting solutions.  Subjective:     Patient ID: Jennifer Manning , female    DOB: 1971/06/20 , 50 y.o.   MRN: [id number]   Chief Complaint  Patient presents with   Fatigue   swelling in legs   trouble breathing    HPI  Patient presents today for fatigue, swelling in her legs and SOB. She reports having SOB with exertion and sometimes at rest. The swelling is usually located at her ankles. She denies change in her diet. She stated she did go to see her cardiologist and he told her everything was fine. She is concerned that something is going on with her heart despite having neg echo, stress test. She has also worn a monitor.  Unfortunately, she never got the results of her heart monitor reading.     Past Medical History:  Diagnosis Date   Migraines    Post concussive syndrome      Family History  Problem Relation Age of Onset   Heart Problems Mother    Hyperlipidemia Mother    Hypertension Mother    Hyperlipidemia Father    Hyperlipidemia Brother    Hypertension Brother    Dementia Neg Hx      Current Outpatient Medications:    albuterol (VENTOLIN HFA) 108 (90 Base) MCG/ACT inhaler, Inhale 2 puffs into the lungs every 6 (six) hours as needed for wheezing or shortness of breath., Disp: 8.5 g, Rfl: 2   Ascorbic Acid (VITAMIN C) 100 MG tablet, Take 100 mg by mouth daily., Disp: , Rfl:    aspirin-acetaminophen-caffeine (EXCEDRIN MIGRAINE) 250-250-65 MG per tablet, Take 1-2 tablets  by mouth every 6 (six) hours as needed for headache., Disp: , Rfl:    cetirizine (ZYRTEC) 10 MG tablet, Take 10 mg by mouth as needed. , Disp: , Rfl:    Cholecalciferol (VITAMIN D PO), Take 1 tablet by mouth daily. 2000units, Disp: , Rfl:    cyclobenzaprine (FLEXERIL) 5 MG tablet, One tab po qpm prn, Disp: 30 tablet, Rfl: 0   ibuprofen (ADVIL) 800 MG tablet, Take 1 tablet (800 mg total) by mouth daily as needed., Disp: 30 tablet, Rfl: 0   magnesium oxide (MAG-OX) 400 (241.3 Mg) MG tablet, Take 400 mg by mouth daily., Disp: , Rfl:    mometasone (NASONEX) 50 MCG/ACT nasal spray, Place 2 sprays into the nose daily. (Patient taking differently: Place 2 sprays into the nose as needed.), Disp: 17 g, Rfl: 2   Omega-3 1000 MG CAPS, Take 1,000 mg by mouth daily., Disp: , Rfl:    progesterone (PROMETRIUM) 200 MG capsule, Take 200 mg by mouth daily., Disp: , Rfl:    SYMBICORT 80-4.5 MCG/ACT inhaler, TAKE 2 PUFFS BY MOUTH TWICE A DAY IN THE MORNING AND IN THE EVENING, Disp: 30.6 Inhaler, Rfl: 1   UBRELVY 50 MG TABS, TAKE 1 TABLET BY MOUTH DAILY AS NEEDED., Disp: 12 tablet, Rfl: 1   Allergies  Allergen Reactions   Other Anaphylaxis   Sulfa Antibiotics Anaphylaxis     Review  of Systems  Constitutional:  Positive for fatigue.  Respiratory:  Positive for shortness of breath.   Cardiovascular:  Positive for palpitations and leg swelling.  Gastrointestinal: Negative.   Neurological: Negative.   Psychiatric/Behavioral: Negative.      Today's Vitals   04/26/21 0950  BP: 112/80  Pulse: 65  Temp: 98.1 F (36.7 C)  Weight: 124 lb 12.8 oz (56.6 kg)  Height: $Remove'4\' 11"'sBHnQfl$  (1.499 m)  PainSc: 0-No pain   Body mass index is 25.21 kg/m.  Wt Readings from Last 3 Encounters:  04/26/21 124 lb 12.8 oz (56.6 kg)  12/07/20 127 lb 9.6 oz (57.9 kg)  11/08/20 128 lb 3.2 oz (58.2 kg)     Objective:  Physical Exam Vitals and nursing note reviewed.  Constitutional:      Appearance: Normal appearance.  HENT:      Head: Normocephalic and atraumatic.     Nose:     Comments: Masked     Mouth/Throat:     Comments: Masked  Eyes:     Extraocular Movements: Extraocular movements intact.  Cardiovascular:     Rate and Rhythm: Normal rate and regular rhythm.     Heart sounds: Normal heart sounds.  Pulmonary:     Effort: Pulmonary effort is normal.     Breath sounds: Normal breath sounds.  Musculoskeletal:     Cervical back: Normal range of motion.  Skin:    General: Skin is warm.  Neurological:     General: No focal deficit present.     Mental Status: She is alert.  Psychiatric:        Mood and Affect: Mood normal.        Behavior: Behavior normal.        Assessment And Plan:     1. Fatigue, unspecified type Comments: She does report having heavy cycles, I will check CBC today. Denies snoring, admits to non-restorative sleep, early AM headaches - denies nocturia. She agrees to Neuro referral for sleep study evaluaiton.  - CBC no Diff - TSH - Magnesium - Ambulatory referral to Neurology - BMP8+EGFR  2. Menorrhagia with regular cycle Comments: Chronic, now followed by GYN. She was advised she is not candidate for ablation, plans to move forward with IUD placement later this winter.  - CBC no Diff - TSH - Iron, TIBC and Ferritin Panel  3. Lower extremity edema Comments: Not present today. Encouraged to limit her sodium intake and elevate her legs seated.   4. Immunization due Comments: I will send rx Shingrix to her local pharmacy.  She will also be given flu vaccine today.  - Flu Vaccine QUAD 6+ mos PF IM (Fluarix Quad PF)   5. Shortness of breath I contacted Cardiology who stated her monitor results were never received. They will order another monitor for the pt, she is aware. I will check CBC to r/o anemia.   Patient was given opportunity to ask questions. Patient verbalized understanding of the plan and was able to repeat key elements of the plan. All questions were answered to  their satisfaction.   I, Maximino Greenland, MD, have reviewed all documentation for this visit. The documentation on 04/26/21 for the exam, diagnosis, procedures, and orders are all accurate and complete.   IF YOU HAVE BEEN REFERRED TO A SPECIALIST, IT MAY TAKE 1-2 WEEKS TO SCHEDULE/PROCESS THE REFERRAL. IF YOU HAVE NOT HEARD FROM US/SPECIALIST IN TWO WEEKS, PLEASE GIVE [place] A CALL AT [phone].   THE PATIENT IS  ENCOURAGED TO PRACTICE SOCIAL DISTANCING DUE TO THE COVID-19 PANDEMIC.

## 2021-04-26 NOTE — Patient Instructions (Signed)
Sleep Study, Adult A sleep study (polysomnogram) is a series of tests done while you are sleeping. A sleep study records your brain waves, heart rate, breathing rate, oxygen level, and eye and leg movements. A sleep study helps your health care provider: See how well you sleep. Diagnose a sleep disorder. Determine how severe your sleep disorder is. Create a plan to treat your sleep disorder. Your health care provider may recommend a sleep study if you: Feel sleepy on most days. Snore loudly while sleeping. Have unusual behaviors while you sleep, such as walking. Have brief periods in which you stop breathing during sleep (sleepapnea). Fall asleep suddenly during the day (narcolepsy). Have trouble falling asleep or staying asleep (insomnia). Feel like you need to move your legs when trying to fall asleep (restless legs syndrome). Move your legs by flexing and extending them regularly while asleep (periodic limb movement disorder). Act out your dreams while you sleep (sleep behavior disorder). Feel like you cannot move when you first wake up (sleep paralysis). What tests are part of a sleep study? Most sleep studies record the following during sleep: Brain activity. Eye movements. Heart rate and rhythm. Breathing rate and rhythm. Blood-oxygen level. Blood pressure. Chest and belly movement as you breathe. Arm and leg movements. Snoring or other noises. Body position. Where are sleep studies done? Sleep studies are done at sleep centers. A sleep center may be inside a hospital, office, or clinic. The room where you have the study may look like a hospital room or a hotel room. The health care providers doing the study may come in and out of the room during the study. Most of the time, they will be in another room monitoring your test as you sleep. How are sleep studies done? Most sleep studies are done during a normal period of time for a full night of sleep. You will arrive at the  study center in the evening and go home in the morning. Before the test Bring your pajamas and toothbrush with you to the sleep study. Do not have caffeine on the day of your sleep study. Do not drink alcohol on the day of your sleep study. Your health care provider will let you know if you should stop taking any of your regular medicines before the test. During the test   Round, sticky patches with sensors attached to recording wires (electrodes) are placed on your scalp, face, chest, and limbs. Wires from all the electrodes and sensors run from your bed to a computer. The wires can be taken off and put back on if you need to get out of bed to go to the bathroom. A sensor is placed over your nose to measure airflow. A finger clip is put on your finger or ear to measure your blood oxygen level (pulse oximetry). A belt is placed around your belly and a belt is placed around your chest to measure breathing movements. If you have signs of the sleep disorder called sleep apnea during your test, you may get a treatment mask to wear for the second half of the night. The mask provides positive airway pressure (PAP) to help you breathe better during sleep. This may greatly improve your sleep apnea. You will then have all tests done again with the mask in place to see if your measurements and recordings change. After the test A medical doctor who specializes in sleep will evaluate the results of your sleep study and share them with you and your primary health  care provider. Based on your results, your medical history, and a physical exam, you may be diagnosed with a sleep disorder, such as: Sleep apnea. Restless legs syndrome. Sleep-related behavior disorder. Sleep-related movement disorders. Sleep-related seizure disorders. Your health care team will help determine your treatment options based on your diagnosis. This may include: Improving your sleep habits (sleep hygiene). Wearing a continuous  positive airway pressure (CPAP) or bi-level positive airway pressure (BPAP) mask. Wearing an oral device at night to improve breathing and reduce snoring. Taking medicines. Follow these instructions at home: Take over-the-counter and prescription medicines only as told by your health care provider. If you are instructed to use a CPAP or BPAP mask, make sure you use it nightly as directed. Make any lifestyle changes that your health care provider recommends. If you were given a device to open your airway while you sleep, use it only as told by your health care provider. Do not use any tobacco products, such as cigarettes, chewing tobacco, and e-cigarettes. If you need help quitting, ask your health care provider. Keep all follow-up visits as told by your health care provider. This is important. Summary A sleep study (polysomnogram) is a series of tests done while you are sleeping. It shows how well you sleep. Most sleep studies are done over one full night of sleep. You will arrive at the study center in the evening and go home in the morning. If you have signs of the sleep disorder called sleep apnea during your test, you may get a treatment mask to wear for the second half of the night. A medical doctor who specializes in sleep will evaluate the results of your sleep study and share them with your primary health care provider. This information is not intended to replace advice given to you by your health care provider. Make sure you discuss any questions you have with your health care provider. Document Revised: 08/14/2019 Document Reviewed: 08/06/2017 Elsevier Patient Education  2022 [employer].

## 2021-04-26 NOTE — Progress Notes (Signed)
Previous monitor was lost and patient continues to have palpitations. PCP has requested repeat monitor.

## 2021-04-27 LAB — IRON,TIBC AND FERRITIN PANEL
Ferritin: 6 ng/mL — ABNORMAL LOW (ref 15–150)
Iron Saturation: 7 % — CL (ref 15–55)
Iron: 22 ug/dL — ABNORMAL LOW (ref 27–159)
Total Iron Binding Capacity: 319 ug/dL (ref 250–450)
UIBC: 297 ug/dL (ref 131–425)

## 2021-04-27 LAB — BMP8+EGFR
BUN/Creatinine Ratio: 13 (ref 9–23)
BUN: 9 mg/dL (ref 6–24)
CO2: 20 mmol/L (ref 20–29)
Calcium: 8.9 mg/dL (ref 8.7–10.2)
Chloride: 103 mmol/L (ref 96–106)
Creatinine, Ser: 0.68 mg/dL (ref 0.57–1.00)
Glucose: 78 mg/dL (ref 70–99)
Potassium: 4.5 mmol/L (ref 3.5–5.2)
Sodium: 138 mmol/L (ref 134–144)
eGFR: 105 mL/min/{1.73_m2} (ref >59)

## 2021-04-27 LAB — CBC
Hematocrit: 30.9 % — ABNORMAL LOW (ref 34.0–46.6)
Hemoglobin: 10 g/dL — ABNORMAL LOW (ref 11.1–15.9)
MCH: 26.7 pg (ref 26.6–33.0)
MCHC: 32.4 g/dL (ref 31.5–35.7)
MCV: 83 fL (ref 79–97)
Platelets: 395 10*3/uL (ref 150–450)
RBC: 3.74 x10E6/uL — ABNORMAL LOW (ref 3.77–5.28)
RDW: 15.2 % (ref 11.7–15.4)
WBC: 4.3 10*3/uL (ref 3.4–10.8)

## 2021-04-27 LAB — TSH: TSH: 1.73 u[IU]/mL (ref 0.450–4.500)

## 2021-04-27 LAB — MAGNESIUM: Magnesium: 1.9 mg/dL (ref 1.6–2.3)

## 2021-04-28 ENCOUNTER — Inpatient Hospital Stay: Payer: BC Managed Care – PPO

## 2021-04-28 ENCOUNTER — Other Ambulatory Visit: Payer: Self-pay

## 2021-04-28 DIAGNOSIS — R002 Palpitations: Secondary | ICD-10-CM

## 2021-05-19 ENCOUNTER — Other Ambulatory Visit: Payer: Self-pay | Admitting: Internal Medicine

## 2021-05-19 DIAGNOSIS — D500 Iron deficiency anemia secondary to blood loss (chronic): Secondary | ICD-10-CM

## 2021-05-23 ENCOUNTER — Telehealth: Payer: Self-pay | Admitting: Physician Assistant

## 2021-05-23 NOTE — Telephone Encounter (Signed)
Scheduled appt per 10/28 referral. Pt is aware of appt date and time and is aware to arrive 15 mins prior to appt.

## 2021-05-30 ENCOUNTER — Other Ambulatory Visit: Payer: Self-pay | Admitting: Internal Medicine

## 2021-05-30 NOTE — Progress Notes (Signed)
Called and spoke with patient regarding her heart monitor results. Patient requested results be sent to Dr. Baird Cancer.

## 2021-06-09 ENCOUNTER — Telehealth: Payer: Self-pay | Admitting: Physician Assistant

## 2021-06-09 NOTE — Telephone Encounter (Signed)
Rescheduled 11/23 appointment times per providers request, patient has been called and voicemail was left.

## 2021-06-13 ENCOUNTER — Encounter: Payer: Self-pay | Admitting: Nurse Practitioner

## 2021-06-13 ENCOUNTER — Other Ambulatory Visit: Payer: Self-pay

## 2021-06-13 ENCOUNTER — Ambulatory Visit (INDEPENDENT_AMBULATORY_CARE_PROVIDER_SITE_OTHER): Payer: BC Managed Care – PPO | Admitting: Nurse Practitioner

## 2021-06-13 VITALS — BP 112/78 | HR 86 | Temp 98.8°F | Ht 59.0 in | Wt 125.8 lb

## 2021-06-13 DIAGNOSIS — R0602 Shortness of breath: Secondary | ICD-10-CM | POA: Diagnosis not present

## 2021-06-13 DIAGNOSIS — R059 Cough, unspecified: Secondary | ICD-10-CM | POA: Diagnosis not present

## 2021-06-13 DIAGNOSIS — J4520 Mild intermittent asthma, uncomplicated: Secondary | ICD-10-CM

## 2021-06-13 MED ORDER — BUDESONIDE-FORMOTEROL FUMARATE 80-4.5 MCG/ACT IN AERO: INHALATION_SPRAY | RESPIRATORY_TRACT | 1 refills | Status: DC

## 2021-06-13 MED ORDER — ALBUTEROL SULFATE HFA 108 (90 BASE) MCG/ACT IN AERS: 2.0000 | INHALATION_SPRAY | Freq: Four times a day (QID) | RESPIRATORY_TRACT | 2 refills | Status: DC | PRN

## 2021-06-13 NOTE — Progress Notes (Signed)
I,Jameka J Llittleton,acting as a Education administrator for [employer], NP.,have documented all relevant documentation on the behalf of [employer], NP,as directed by  Bary Castilla, NP while in the presence of Bary Castilla, NP.  This visit occurred during the SARS-CoV-2 public health emergency.  Safety protocols were in place, including screening questions prior to the visit, additional usage of staff PPE, and extensive cleaning of exam room while observing appropriate contact time as indicated for disinfecting solutions.  Subjective:     Patient ID: Jennifer Manning , female    DOB: 01/13/1971 , 50 y.o.   MRN: [id number]   Chief Complaint  Patient presents with   B12 Injection   Cough   HPI  She is here with cough that she has been experiencing for a while now. She thinks its from her allergies as well as the change of weather. She has been using her inhaler and requests a refill on her albuterol and Symbicort. She also complains of having to use her inhaler more frequently so she wanted to check into that. Denies any other symptoms.  Cough This is a recurrent problem. The current episode started 1 to 4 weeks ago. Pertinent negatives include no chest pain, headaches or wheezing. The symptoms are aggravated by cold air. She has tried steroid inhaler for the symptoms. Her past medical history is significant for environmental allergies.    Past Medical History:  Diagnosis Date   Migraines    Post concussive syndrome      Family History  Problem Relation Age of Onset   Heart Problems Mother    Hyperlipidemia Mother    Hypertension Mother    Hyperlipidemia Father    Hyperlipidemia Brother    Hypertension Brother    Dementia Neg Hx      Current Outpatient Medications:    Ascorbic Acid (VITAMIN C) 100 MG tablet, Take 100 mg by mouth daily., Disp: , Rfl:    aspirin-acetaminophen-caffeine (EXCEDRIN MIGRAINE) 250-250-65 MG per tablet, Take 1-2 tablets by mouth every 6 (six)  hours as needed for headache., Disp: , Rfl:    cetirizine (ZYRTEC) 10 MG tablet, Take 10 mg by mouth as needed. , Disp: , Rfl:    Cholecalciferol (VITAMIN D PO), Take 1 tablet by mouth daily. 2000units, Disp: , Rfl:    cyclobenzaprine (FLEXERIL) 5 MG tablet, One tab po qpm prn, Disp: 30 tablet, Rfl: 0   ibuprofen (ADVIL) 800 MG tablet, TAKE 1 TABLET (800 MG TOTAL) BY MOUTH DAILY AS NEEDED., Disp: 30 tablet, Rfl: 0   magnesium oxide (MAG-OX) 400 (241.3 Mg) MG tablet, Take 400 mg by mouth daily., Disp: , Rfl:    mometasone (NASONEX) 50 MCG/ACT nasal spray, Place 2 sprays into the nose daily. (Patient taking differently: Place 2 sprays into the nose as needed.), Disp: 17 g, Rfl: 2   Omega-3 1000 MG CAPS, Take 1,000 mg by mouth daily., Disp: , Rfl:    progesterone (PROMETRIUM) 200 MG capsule, Take 200 mg by mouth daily., Disp: , Rfl:    albuterol (VENTOLIN HFA) 108 (90 Base) MCG/ACT inhaler, Inhale 2 puffs into the lungs every 6 (six) hours as needed for wheezing or shortness of breath., Disp: 8.5 g, Rfl: 2   budesonide-formoterol (SYMBICORT) 80-4.5 MCG/ACT inhaler, TAKE 2 PUFFS BY MOUTH TWICE A DAY IN THE MORNING AND IN THE EVENING, Disp: 30.6 each, Rfl: 1   UBRELVY 50 MG TABS, TAKE 1 TABLET BY MOUTH DAILY AS NEEDED., Disp: 12 tablet, Rfl: 1   Allergies  Allergen Reactions   Other Anaphylaxis   Sulfa Antibiotics Anaphylaxis     Review of Systems  Constitutional:  Negative for fatigue.  HENT:  Negative for congestion, sinus pressure and sinus pain.   Respiratory:  Positive for cough. Negative for wheezing.   Cardiovascular:  Negative for chest pain and palpitations.  Allergic/Immunologic: Positive for environmental allergies.  Neurological:  Negative for weakness and headaches.    Today's Vitals   06/13/21 1201  BP: 112/78  Pulse: 86  Temp: 98.8 F (37.1 C)  Weight: 125 lb 12.8 oz (57.1 kg)  Height: 4\' 11"  (1.499 m)  PainSc: 0-No pain   Body mass index is 25.41 kg/m.  Wt Readings  from Last 3 Encounters:  06/13/21 125 lb 12.8 oz (57.1 kg)  04/26/21 124 lb 12.8 oz (56.6 kg)  12/07/20 127 lb 9.6 oz (57.9 kg)     Objective:  Physical Exam Constitutional:      Appearance: Normal appearance.  HENT:     Head: Normocephalic and atraumatic.  Cardiovascular:     Rate and Rhythm: Normal rate and regular rhythm.     Pulses: Normal pulses.     Heart sounds: Normal heart sounds. No murmur heard. Pulmonary:     Effort: Pulmonary effort is normal. No respiratory distress.     Breath sounds: Normal breath sounds. No wheezing.  Skin:    General: Skin is warm and dry.     Capillary Refill: Capillary refill takes less than 2 seconds.  Neurological:     Mental Status: She is alert.        Assessment And Plan:     1. Intermittent asthma without complication, unspecified asthma severity -Refilled her inhaler.  - albuterol (VENTOLIN HFA) 108 (90 Base) MCG/ACT inhaler; Inhale 2 puffs into the lungs every 6 (six) hours as needed for wheezing or shortness of breath.  Dispense: 8.5 g; Refill: 2 - budesonide-formoterol (SYMBICORT) 80-4.5 MCG/ACT inhaler; TAKE 2 PUFFS BY MOUTH TWICE A DAY IN THE MORNING AND IN THE EVENING  Dispense: 30.6 each; Refill: 1 -Advised patient on how to use her inhaler. Pt. Verbalized understanding.   2. Cough, unspecified type - albuterol (VENTOLIN HFA) 108 (90 Base) MCG/ACT inhaler; Inhale 2 puffs into the lungs every 6 (six) hours as needed for wheezing or shortness of breath.  Dispense: 8.5 g; Refill: 2 -Denies HA, congestion, fever  3. Shortness of breath - albuterol (VENTOLIN HFA) 108 (90 Base) MCG/ACT inhaler; Inhale 2 puffs into the lungs every 6 (six) hours as needed for wheezing or shortness of breath.  Dispense: 8.5 g; Refill: 2   The patient was encouraged to call or send a message through [place] for any questions or concerns.   Follow up: if symptoms persist or do not get better.   Side effects and appropriate use of all the  medication(s) were discussed with the patient today. Patient advised to use the medication(s) as directed by their healthcare provider. The patient was encouraged to read, review, and understand all associated package inserts and contact our office with any questions or concerns. The patient accepts the risks of the treatment plan and had an opportunity to ask questions.   Patient was given opportunity to ask questions. Patient verbalized understanding of the plan and was able to repeat key elements of the plan. All questions were answered to their satisfaction.  Raman Ladena Jacquez, DNP   I, Raman Dontel Harshberger have reviewed all documentation for this visit. The documentation on 12/01/20 for the exam, diagnosis,  procedures, and orders are all accurate and complete.   IF YOU HAVE BEEN REFERRED TO A SPECIALIST, IT MAY TAKE 1-2 WEEKS TO SCHEDULE/PROCESS THE REFERRAL. IF YOU HAVE NOT HEARD FROM US/SPECIALIST IN TWO WEEKS, PLEASE GIVE [place] A CALL AT [phone] X [phone].   THE PATIENT IS ENCOURAGED TO PRACTICE SOCIAL DISTANCING DUE TO THE COVID-19 PANDEMIC.

## 2021-06-14 ENCOUNTER — Inpatient Hospital Stay: Payer: BC Managed Care – PPO

## 2021-06-14 ENCOUNTER — Inpatient Hospital Stay: Payer: Self-pay

## 2021-06-14 ENCOUNTER — Inpatient Hospital Stay: Payer: Self-pay | Admitting: Physician Assistant

## 2021-06-14 ENCOUNTER — Inpatient Hospital Stay: Payer: BC Managed Care – PPO | Admitting: Physician Assistant

## 2021-06-30 ENCOUNTER — Inpatient Hospital Stay: Payer: BC Managed Care – PPO

## 2021-06-30 ENCOUNTER — Other Ambulatory Visit: Payer: Self-pay

## 2021-06-30 ENCOUNTER — Inpatient Hospital Stay: Payer: BC Managed Care – PPO | Attending: Physician Assistant | Admitting: Physician Assistant

## 2021-06-30 ENCOUNTER — Telehealth: Payer: Self-pay | Admitting: *Deleted

## 2021-06-30 VITALS — BP 119/84 | HR 81 | Temp 97.3°F | Resp 20 | Wt 126.9 lb

## 2021-06-30 DIAGNOSIS — D500 Iron deficiency anemia secondary to blood loss (chronic): Secondary | ICD-10-CM | POA: Diagnosis not present

## 2021-06-30 DIAGNOSIS — D509 Iron deficiency anemia, unspecified: Secondary | ICD-10-CM | POA: Diagnosis present

## 2021-06-30 DIAGNOSIS — N920 Excessive and frequent menstruation with regular cycle: Secondary | ICD-10-CM | POA: Diagnosis not present

## 2021-06-30 DIAGNOSIS — Z8639 Personal history of other endocrine, nutritional and metabolic disease: Secondary | ICD-10-CM | POA: Diagnosis not present

## 2021-06-30 LAB — CBC WITH DIFFERENTIAL (CANCER CENTER ONLY)
Abs Immature Granulocytes: 0 10*3/uL (ref 0.00–0.07)
Basophils Absolute: 0 10*3/uL (ref 0.0–0.1)
Basophils Relative: 1 %
Eosinophils Absolute: 0 10*3/uL (ref 0.0–0.5)
Eosinophils Relative: 1 %
HCT: 33.6 % — ABNORMAL LOW (ref 36.0–46.0)
Hemoglobin: 10.6 g/dL — ABNORMAL LOW (ref 12.0–15.0)
Immature Granulocytes: 0 %
Lymphocytes Relative: 42 %
Lymphs Abs: 1.8 10*3/uL (ref 0.7–4.0)
MCH: 26.6 pg (ref 26.0–34.0)
MCHC: 31.5 g/dL (ref 30.0–36.0)
MCV: 84.4 fL (ref 80.0–100.0)
Monocytes Absolute: 0.3 10*3/uL (ref 0.1–1.0)
Monocytes Relative: 8 %
Neutro Abs: 2.1 10*3/uL (ref 1.7–7.7)
Neutrophils Relative %: 48 %
Platelet Count: 340 10*3/uL (ref 150–400)
RBC: 3.98 MIL/uL (ref 3.87–5.11)
RDW: 16.5 % — ABNORMAL HIGH (ref 11.5–15.5)
WBC Count: 4.3 10*3/uL (ref 4.0–10.5)
nRBC: 0 % (ref 0.0–0.2)

## 2021-06-30 LAB — CMP (CANCER CENTER ONLY)
ALT: 11 U/L (ref 0–44)
AST: 13 U/L — ABNORMAL LOW (ref 15–41)
Albumin: 4 g/dL (ref 3.5–5.0)
Alkaline Phosphatase: 55 U/L (ref 38–126)
Anion gap: 8 (ref 5–15)
BUN: 9 mg/dL (ref 6–20)
CO2: 23 mmol/L (ref 22–32)
Calcium: 8.8 mg/dL — ABNORMAL LOW (ref 8.9–10.3)
Chloride: 109 mmol/L (ref 98–111)
Creatinine: 0.81 mg/dL (ref 0.44–1.00)
GFR, Estimated: >60 mL/min (ref >60)
Glucose, Bld: 82 mg/dL (ref 70–99)
Potassium: 4 mmol/L (ref 3.5–5.1)
Sodium: 140 mmol/L (ref 135–145)
Total Bilirubin: 0.6 mg/dL (ref 0.3–1.2)
Total Protein: 7.7 g/dL (ref 6.5–8.1)

## 2021-06-30 LAB — VITAMIN B12: Vitamin B-12: 1855 pg/mL — ABNORMAL HIGH (ref 180–914)

## 2021-06-30 LAB — RETIC PANEL
Immature Retic Fract: 12.8 % (ref 2.3–15.9)
RBC.: 4.02 MIL/uL (ref 3.87–5.11)
Retic Count, Absolute: 31 10*3/uL (ref 19.0–186.0)
Retic Ct Pct: 0.8 % (ref 0.4–3.1)
Reticulocyte Hemoglobin: 29.9 pg (ref >27.9)

## 2021-06-30 LAB — IRON AND TIBC
Iron: 37 ug/dL — ABNORMAL LOW (ref 41–142)
Saturation Ratios: 10 % — ABNORMAL LOW (ref 21–57)
TIBC: 356 ug/dL (ref 236–444)
UIBC: 320 ug/dL (ref 120–384)

## 2021-06-30 LAB — FERRITIN: Ferritin: 4 ng/mL — ABNORMAL LOW (ref 11–307)

## 2021-06-30 MED ORDER — FERROUS SULFATE 325 (65 FE) MG PO TBEC: 325.0000 mg | DELAYED_RELEASE_TABLET | Freq: Every day | ORAL | 3 refills | Status: DC

## 2021-06-30 NOTE — Progress Notes (Signed)
[place] [phone]   Fax:(336) [place] NOTE  Patient Care Team: Glendale Chard, MD as PCP - General (Internal Medicine)   CHIEF COMPLAINTS/PURPOSE OF CONSULTATION:  Iron deficiency anemia  HISTORY OF PRESENTING ILLNESS:  Jennifer Manning is a 50 y.o. female who presents for initial evaluation treatment recommendations for iron deficiency anemia. She is unaccompanied for this visit.  On review of the previous records, [name] was found to have iron deficiency anemia based on blood work from 04/26/2021.  CBC showed hemoglobin 10.0 and MCV 83.  Iron panel showed serum iron 22, iron saturation 7% and ferritin levels at 6.  On exam today, she reports progressive fatigue although she tries to complete all her ADLs on her own.  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  Her appetite is unchanged and eats a balanced diet with meat.  She denies any nausea, vomiting or abdominal pain.  Her bowel habits are unchanged without any diarrhea or constipation.  She denies easy bruising or signs of bleeding except for her monthly menstrual cycle. She reports that her menstrual cycle is heavier in the last year.  Her menstrual cycle occurs every 3 weeks lasting up to 7 days with 3 days of heavy bleeding.  She adds that she changes her tampon every hour.  She is under the care of a gynecologist and is in discussion of interventions including IUD placement.  She reports having shortness of breath mainly with exertion and episodes of heart palpitations.  She denies fevers, chills, night sweats, chest pain or cough.  She has no other complaints.  Rest of the 10 point ROS is below.  MEDICAL HISTORY:  Past Medical History:  Diagnosis Date   Migraines    Post concussive syndrome     SURGICAL HISTORY: Past Surgical History:  Procedure Laterality Date   TONSILLECTOMY     TUBAL LIGATION      SOCIAL HISTORY: Social History   Socioeconomic  History   Marital status: Married    Spouse name: Jennifer Manning   Number of children: 3   Years of education: Not on file   Highest education level: Not on file  Occupational History   Occupation: Electrical engineer    Employer: [employer]  Tobacco Use   Smoking status: Never   Smokeless tobacco: Never  Vaping Use   Vaping Use: Never used  Substance and Sexual Activity   Alcohol use: No   Drug use: No   Sexual activity: Not on file  Other Topics Concern   Not on file  Social History Narrative   Lives with her husband and 3 children.     Right handed   Social Determinants of Health   Financial Resource Strain: Not on file  Food Insecurity: Not on file  Transportation Needs: Not on file  Physical Activity: Not on file  Stress: Not on file  Social Connections: Not on file  Intimate Partner Violence: Not on file    FAMILY HISTORY: Family History  Problem Relation Age of Onset   Heart Problems Mother    Hyperlipidemia Mother    Hypertension Mother    Hyperlipidemia Father    Hyperlipidemia Brother    Hypertension Brother    Dementia Neg Hx     ALLERGIES:  is allergic to other and sulfa antibiotics.  MEDICATIONS:  Current Outpatient Medications  Medication Sig Dispense Refill   ferrous sulfate 325 (65 FE) MG EC tablet Take 1 tablet (  325 mg total) by mouth daily with breakfast. 30 tablet 3   albuterol (VENTOLIN HFA) 108 (90 Base) MCG/ACT inhaler Inhale 2 puffs into the lungs every 6 (six) hours as needed for wheezing or shortness of breath. 8.5 g 2   Ascorbic Acid (VITAMIN C) 100 MG tablet Take 100 mg by mouth daily.     aspirin-acetaminophen-caffeine (EXCEDRIN MIGRAINE) 250-250-65 MG per tablet Take 1-2 tablets by mouth every 6 (six) hours as needed for headache.     budesonide-formoterol (SYMBICORT) 80-4.5 MCG/ACT inhaler TAKE 2 PUFFS BY MOUTH TWICE A DAY IN THE MORNING AND IN THE EVENING 30.6 each 1   cetirizine (ZYRTEC) 10 MG tablet Take 10 mg by mouth as  needed.      Cholecalciferol (VITAMIN D PO) Take 1 tablet by mouth daily. 2000units     cyclobenzaprine (FLEXERIL) 5 MG tablet One tab po qpm prn 30 tablet 0   ibuprofen (ADVIL) 800 MG tablet TAKE 1 TABLET (800 MG TOTAL) BY MOUTH DAILY AS NEEDED. 30 tablet 0   magnesium oxide (MAG-OX) 400 (241.3 Mg) MG tablet Take 400 mg by mouth daily.     mometasone (NASONEX) 50 MCG/ACT nasal spray Place 2 sprays into the nose daily. (Patient taking differently: Place 2 sprays into the nose as needed.) 17 g 2   Omega-3 1000 MG CAPS Take 1,000 mg by mouth daily.     progesterone (PROMETRIUM) 200 MG capsule Take 200 mg by mouth daily. (Patient not taking: Reported on 06/30/2021)     UBRELVY 50 MG TABS TAKE 1 TABLET BY MOUTH DAILY AS NEEDED. 12 tablet 1   No current facility-administered medications for this visit.    REVIEW OF SYSTEMS:   Constitutional: ( - ) fevers, ( - )  chills , ( - ) night sweats Eyes: ( - ) blurriness of vision, ( - ) double vision, ( - ) watery eyes Ears, nose, mouth, throat, and face: ( - ) mucositis, ( - ) sore throat Respiratory: ( - ) cough, ( +) dyspnea, ( - ) wheezes Cardiovascular: ( + ) palpitation, ( - ) chest discomfort, ( - ) lower extremity swelling Gastrointestinal:  ( - ) nausea, ( - ) heartburn, ( - ) change in bowel habits Skin: ( - ) abnormal skin rashes Lymphatics: ( - ) new lymphadenopathy, ( - ) easy bruising Neurological: ( - ) numbness, ( - ) tingling, ( - ) new weaknesses Behavioral/Psych: ( - ) mood change, ( - ) new changes  All other systems were reviewed with the patient and are negative.  PHYSICAL EXAMINATION: ECOG PERFORMANCE STATUS: 1 - Symptomatic but completely ambulatory  Vitals:   06/30/21 0918  BP: 119/84  Pulse: 81  Resp: 20  Temp: (!) 97.3 F (36.3 C)  SpO2: 100%   Filed Weights   06/30/21 0918  Weight: 126 lb 14.4 oz (57.6 kg)    GENERAL: well appearing female in NAD  SKIN: skin color, texture, turgor are normal, no rashes or  significant lesions EYES: conjunctiva are pink and non-injected, sclera clear OROPHARYNX: no exudate, no erythema; lips, buccal mucosa, and tongue normal  NECK: supple, non-tender LYMPH:  no palpable lymphadenopathy in the cervical or supraclavicular lymph nodes.  LUNGS: clear to auscultation and percussion with normal breathing effort HEART: regular rate & rhythm and no murmurs and no lower extremity edema ABDOMEN: soft, non-tender, non-distended, normal bowel sounds Musculoskeletal: no cyanosis of digits and no clubbing  PSYCH: alert & oriented x 3, fluent speech  NEURO: no focal motor/sensory deficits  LABORATORY DATA:  I have reviewed the data as listed CBC Latest Ref Rng & Units 06/30/2021 04/26/2021 08/30/2020  WBC 4.0 - 10.5 K/uL 4.3 4.3 7.5  Hemoglobin 12.0 - 15.0 g/dL 10.6(L) 10.0(L) 10.9(L)  Hematocrit 36.0 - 46.0 % 33.6(L) 30.9(L) 34.7  Platelets 150 - 400 K/uL 340 395 401    CMP Latest Ref Rng & Units 06/30/2021 04/26/2021 08/30/2020  Glucose 70 - 99 mg/dL 82 78 83  BUN 6 - 20 mg/dL 9 9 12   Creatinine 0.44 - 1.00 mg/dL 0.81 0.68 0.79  Sodium 135 - 145 mmol/L 140 138 140  Potassium 3.5 - 5.1 mmol/L 4.0 4.5 3.7  Chloride 98 - 111 mmol/L 109 103 103  CO2 22 - 32 mmol/L 23 20 21   Calcium 8.9 - 10.3 mg/dL 8.8(L) 8.9 9.0  Total Protein 6.5 - 8.1 g/dL 7.7 - 7.0  Total Bilirubin 0.3 - 1.2 mg/dL 0.6 - <0.2  Alkaline Phos 38 - 126 U/L 55 - 54  AST 15 - 41 U/L 13(L) - 14  ALT 0 - 44 U/L 11 - 10    ASSESSMENT & PLAN BEAUTIFUL PENSYL is a 50 y.o. female who presents to the clinic for evaluation for iron deficiency anemia. I reviewed lab results from 04/26/2021 in detail with the patient.  Currently, patient does not take oral iron supplementation.  The underlying etiology for iron deficiency anemia is her heavy menstrual bleeding.  Due to patient's persistent symptoms (fatigue, shortness of breath, palpitations and dizziness) and critically low iron levels, we will proceed with IV iron  replacement. In the interim, she will initiate oral iron supplementation with ferrous sulfate 325 mg once daily.   She will proceed with laboratory evaluation today and repeat CBC, CMP, iron and TIBC, reticulocyte panel, ferritin and vitamin B12 levels.   #Iron deficiency anemia 2/2 menstrual bleeding: --Not taking oral iron supplementation. Sent prescription for ferrous sulfate 325 mg once daily. Advised to take with a source of vitamin C (OJ). --Provided list of iron rich foods to incorporate into her diet. --Labs today to check CBC, CMP, iron and TIBC, retic panel, ferritin --Under the care of GYN and is discussing interventions including IUD placement --Recommend IV monoferric x 1 dose since patient is symptomatic. --RTC in 8 weeks with repeat labs.    #Hx of B12 injections: --Repeat Vitamin B12 level today to see if she needs additional replacement.    Orders Placed This Encounter  Procedures   CBC with Differential ([place] Only)    Standing Status:   Future    Number of Occurrences:   1    Standing Expiration Date:   06/30/2022   CMP ([place] only)    Standing Status:   Future    Number of Occurrences:   1    Standing Expiration Date:   06/30/2022   Ferritin    Standing Status:   Future    Number of Occurrences:   1    Standing Expiration Date:   06/30/2022   Iron and TIBC    Standing Status:   Future    Number of Occurrences:   1    Standing Expiration Date:   06/30/2022   Retic Panel    Standing Status:   Future    Number of Occurrences:   1    Standing Expiration Date:   06/30/2022   Vitamin B12    Standing Status:   Future    Number  of Occurrences:   1    Standing Expiration Date:   06/30/2022    All questions were answered. The patient knows to call the clinic with any problems, questions or concerns.  I have spent a total of 60 minutes minutes of face-to-face and non-face-to-face time, preparing to see the patient, obtaining and/or reviewing  separately obtained history, performing a medically appropriate examination, counseling and educating the patient, ordering medications/tests, documenting clinical information in the electronic health record, and care coordination.   Dede Query, PA-C Department of Hematology/Oncology [place] at [facility] Phone: [phone]

## 2021-06-30 NOTE — Telephone Encounter (Signed)
-----   Message from Lincoln Brigham, PA-C sent at 06/30/2021 12:02 PM EST ----- Please notify patient that labs from today confirmed iron deficiency anemia. We will proceed with IV iron at [facility]. Schedulers will be in touch to get it arranged.

## 2021-06-30 NOTE — Telephone Encounter (Signed)
TCT patient regarding today's lab results.  No answer but was able to leave vm message for her to call back to [phone] to review labs and plan for IV iron

## 2021-07-05 ENCOUNTER — Other Ambulatory Visit: Payer: Self-pay | Admitting: Pharmacy Technician

## 2021-07-05 ENCOUNTER — Telehealth: Payer: Self-pay | Admitting: Pharmacy Technician

## 2021-07-05 NOTE — Telephone Encounter (Signed)
Dr. Charlies Silvers, We will enter a therapy plan for venofer and the patient will be scheduled as soon as possible.  Ty Maudie Mercury

## 2021-07-05 NOTE — Telephone Encounter (Signed)
Auth Submission: DENIED Payer: BCBS Medication & CPT/J Code(s) submitted: MONOFERRIC Route of submission (phone, fax, portal): PHONE Auth type: Buy/Bill Units/visits requested: 1 Denied due to patient has tried and or failed step therapy. Venofer Infed Ferrlecit  Will attempt to enroll patient in Copay Asssistance Program for Monoferric.  If denied would you like to try Venofer??? Please advise  Will update once we receive a response.

## 2021-07-27 ENCOUNTER — Ambulatory Visit (INDEPENDENT_AMBULATORY_CARE_PROVIDER_SITE_OTHER): Payer: BC Managed Care – PPO

## 2021-07-27 ENCOUNTER — Ambulatory Visit: Payer: BC Managed Care – PPO | Admitting: Internal Medicine

## 2021-07-27 ENCOUNTER — Other Ambulatory Visit: Payer: Self-pay

## 2021-07-27 VITALS — BP 102/69 | HR 66 | Temp 97.9°F | Resp 16 | Ht 60.0 in | Wt 126.8 lb

## 2021-07-27 DIAGNOSIS — D500 Iron deficiency anemia secondary to blood loss (chronic): Secondary | ICD-10-CM | POA: Diagnosis not present

## 2021-07-27 MED ORDER — ALBUTEROL SULFATE HFA 108 (90 BASE) MCG/ACT IN AERS: 2.0000 | INHALATION_SPRAY | Freq: Once | RESPIRATORY_TRACT | Status: DC | PRN

## 2021-07-27 MED ORDER — SODIUM CHLORIDE 0.9 % IV SOLN: Freq: Once | INTRAVENOUS | Status: DC | PRN

## 2021-07-27 MED ORDER — SODIUM CHLORIDE 0.9 % IV SOLN
200.0000 mg | Freq: Once | INTRAVENOUS | Status: AC
  Administered 2021-07-27: 200 mg via INTRAVENOUS
  Filled 2021-07-27: qty 10

## 2021-07-27 MED ORDER — DIPHENHYDRAMINE HCL 50 MG/ML IJ SOLN: 50.0000 mg | Freq: Once | INTRAMUSCULAR | Status: DC | PRN

## 2021-07-27 MED ORDER — METHYLPREDNISOLONE SODIUM SUCC 125 MG IJ SOLR: 125.0000 mg | Freq: Once | INTRAMUSCULAR | Status: DC | PRN

## 2021-07-27 MED ORDER — FAMOTIDINE IN NACL 20-0.9 MG/50ML-% IV SOLN: 20.0000 mg | Freq: Once | INTRAVENOUS | Status: DC | PRN

## 2021-07-27 MED ORDER — EPINEPHRINE 0.3 MG/0.3ML IJ SOAJ: 0.3000 mg | Freq: Once | INTRAMUSCULAR | Status: DC | PRN

## 2021-07-27 NOTE — Progress Notes (Signed)
Diagnosis: Iron Deficiency Anemia  Provider:  Marshell Garfinkel, MD  Procedure: Infusion  IV Type: Peripheral, IV Location: R Antecubital  Venofer (Iron Sucrose), Dose: 200 mg  Infusion Start Time: 3361  Infusion Stop Time: 1030  Post Infusion IV Care: Observation period completed  Discharge: Condition: Good, Destination: Home . AVS provided to patient.   Performed by:  Paul Dykes, RN

## 2021-08-02 ENCOUNTER — Ambulatory Visit: Payer: BC Managed Care – PPO

## 2021-08-03 ENCOUNTER — Encounter: Payer: Self-pay | Admitting: Neurology

## 2021-08-03 ENCOUNTER — Institutional Professional Consult (permissible substitution): Payer: BC Managed Care – PPO | Admitting: Neurology

## 2021-08-07 ENCOUNTER — Ambulatory Visit (INDEPENDENT_AMBULATORY_CARE_PROVIDER_SITE_OTHER): Payer: BC Managed Care – PPO

## 2021-08-07 ENCOUNTER — Other Ambulatory Visit: Payer: Self-pay

## 2021-08-07 VITALS — BP 102/70 | HR 72 | Temp 98.2°F | Resp 16 | Ht 60.0 in | Wt 124.8 lb

## 2021-08-07 DIAGNOSIS — D500 Iron deficiency anemia secondary to blood loss (chronic): Secondary | ICD-10-CM | POA: Diagnosis not present

## 2021-08-07 MED ORDER — SODIUM CHLORIDE 0.9 % IV SOLN: Freq: Once | INTRAVENOUS | Status: DC | PRN

## 2021-08-07 MED ORDER — DIPHENHYDRAMINE HCL 50 MG/ML IJ SOLN: 50.0000 mg | Freq: Once | INTRAMUSCULAR | Status: DC | PRN

## 2021-08-07 MED ORDER — METHYLPREDNISOLONE SODIUM SUCC 125 MG IJ SOLR: 125.0000 mg | Freq: Once | INTRAMUSCULAR | Status: DC | PRN

## 2021-08-07 MED ORDER — SODIUM CHLORIDE 0.9 % IV SOLN
200.0000 mg | Freq: Once | INTRAVENOUS | Status: AC
  Administered 2021-08-07: 200 mg via INTRAVENOUS
  Filled 2021-08-07: qty 10

## 2021-08-07 MED ORDER — ALBUTEROL SULFATE HFA 108 (90 BASE) MCG/ACT IN AERS: 2.0000 | INHALATION_SPRAY | Freq: Once | RESPIRATORY_TRACT | Status: DC | PRN

## 2021-08-07 MED ORDER — FAMOTIDINE IN NACL 20-0.9 MG/50ML-% IV SOLN: 20.0000 mg | Freq: Once | INTRAVENOUS | Status: DC | PRN

## 2021-08-07 MED ORDER — EPINEPHRINE 0.3 MG/0.3ML IJ SOAJ: 0.3000 mg | Freq: Once | INTRAMUSCULAR | Status: DC | PRN

## 2021-08-07 NOTE — Progress Notes (Signed)
Diagnosis: Iron Deficiency Anemia  Provider:  Marshell Garfinkel, MD  Procedure: Infusion  IV Type: Peripheral, IV Location: R Antecubital  Venofer (Iron Sucrose), Dose: 200 mg  Infusion Start Time: 3354  Infusion Stop Time: 5625  Post Infusion IV Care: Peripheral IV Discontinued  Discharge: Condition: Good, Destination: Home . AVS provided to patient.   Performed by:  Charlie Pitter, RN

## 2021-08-15 ENCOUNTER — Ambulatory Visit (INDEPENDENT_AMBULATORY_CARE_PROVIDER_SITE_OTHER): Payer: BC Managed Care – PPO

## 2021-08-15 ENCOUNTER — Other Ambulatory Visit: Payer: Self-pay

## 2021-08-15 VITALS — BP 105/69 | HR 63 | Temp 98.1°F | Resp 16 | Ht 60.0 in | Wt 126.6 lb

## 2021-08-15 DIAGNOSIS — D500 Iron deficiency anemia secondary to blood loss (chronic): Secondary | ICD-10-CM

## 2021-08-15 MED ORDER — METHYLPREDNISOLONE SODIUM SUCC 125 MG IJ SOLR: 125.0000 mg | Freq: Once | INTRAMUSCULAR | Status: DC | PRN

## 2021-08-15 MED ORDER — FAMOTIDINE IN NACL 20-0.9 MG/50ML-% IV SOLN: 20.0000 mg | Freq: Once | INTRAVENOUS | Status: DC | PRN

## 2021-08-15 MED ORDER — ALBUTEROL SULFATE HFA 108 (90 BASE) MCG/ACT IN AERS: 2.0000 | INHALATION_SPRAY | Freq: Once | RESPIRATORY_TRACT | Status: DC | PRN

## 2021-08-15 MED ORDER — SODIUM CHLORIDE 0.9 % IV SOLN: Freq: Once | INTRAVENOUS | Status: DC | PRN

## 2021-08-15 MED ORDER — EPINEPHRINE 0.3 MG/0.3ML IJ SOAJ: 0.3000 mg | Freq: Once | INTRAMUSCULAR | Status: DC | PRN

## 2021-08-15 MED ORDER — DIPHENHYDRAMINE HCL 50 MG/ML IJ SOLN: 50.0000 mg | Freq: Once | INTRAMUSCULAR | Status: DC | PRN

## 2021-08-15 MED ORDER — SODIUM CHLORIDE 0.9 % IV SOLN
200.0000 mg | Freq: Once | INTRAVENOUS | Status: AC
  Administered 2021-08-15: 13:00:00 200 mg via INTRAVENOUS
  Filled 2021-08-15: qty 10

## 2021-08-15 NOTE — Progress Notes (Signed)
Diagnosis: Iron Deficiency Anemia  Provider:  Marshell Garfinkel, MD  Procedure: Infusion  IV Type: Peripheral, IV Location: R Antecubital  Venofer (Iron Sucrose), Dose: 200 mg  Infusion Start Time: 0045 08/15/2021  Infusion Stop Time: 9977 08/15/2021  Post Infusion IV Care: Peripheral IV Discontinued  Discharge: Condition: Good, Destination: Home . AVS provided to patient.   Performed by:  Arnoldo Morale, RN

## 2021-08-21 ENCOUNTER — Ambulatory Visit: Payer: BC Managed Care – PPO

## 2021-08-22 ENCOUNTER — Other Ambulatory Visit: Payer: Self-pay | Admitting: Physician Assistant

## 2021-08-22 DIAGNOSIS — D500 Iron deficiency anemia secondary to blood loss (chronic): Secondary | ICD-10-CM

## 2021-08-23 ENCOUNTER — Inpatient Hospital Stay: Payer: BC Managed Care – PPO | Admitting: Physician Assistant

## 2021-08-23 ENCOUNTER — Inpatient Hospital Stay: Payer: BC Managed Care – PPO | Attending: Physician Assistant

## 2021-08-23 ENCOUNTER — Other Ambulatory Visit: Payer: Self-pay

## 2021-08-23 VITALS — BP 134/64 | HR 74 | Temp 96.8°F | Resp 16 | Wt 123.5 lb

## 2021-08-23 DIAGNOSIS — D509 Iron deficiency anemia, unspecified: Secondary | ICD-10-CM | POA: Insufficient documentation

## 2021-08-23 DIAGNOSIS — D500 Iron deficiency anemia secondary to blood loss (chronic): Secondary | ICD-10-CM | POA: Diagnosis not present

## 2021-08-23 LAB — CBC WITH DIFFERENTIAL (CANCER CENTER ONLY)
Abs Immature Granulocytes: 0 10*3/uL (ref 0.00–0.07)
Basophils Absolute: 0 10*3/uL (ref 0.0–0.1)
Basophils Relative: 1 %
Eosinophils Absolute: 0.1 10*3/uL (ref 0.0–0.5)
Eosinophils Relative: 2 %
HCT: 34.5 % — ABNORMAL LOW (ref 36.0–46.0)
Hemoglobin: 11 g/dL — ABNORMAL LOW (ref 12.0–15.0)
Immature Granulocytes: 0 %
Lymphocytes Relative: 42 %
Lymphs Abs: 1.9 10*3/uL (ref 0.7–4.0)
MCH: 28.2 pg (ref 26.0–34.0)
MCHC: 31.9 g/dL (ref 30.0–36.0)
MCV: 88.5 fL (ref 80.0–100.0)
Monocytes Absolute: 0.3 10*3/uL (ref 0.1–1.0)
Monocytes Relative: 8 %
Neutro Abs: 2.1 10*3/uL (ref 1.7–7.7)
Neutrophils Relative %: 47 %
Platelet Count: 312 10*3/uL (ref 150–400)
RBC: 3.9 MIL/uL (ref 3.87–5.11)
RDW: 18.9 % — ABNORMAL HIGH (ref 11.5–15.5)
WBC Count: 4.4 10*3/uL (ref 4.0–10.5)
nRBC: 0 % (ref 0.0–0.2)

## 2021-08-23 LAB — IRON AND IRON BINDING CAPACITY (CC-WL,HP ONLY)
Iron: 32 ug/dL (ref 28–170)
Saturation Ratios: 10 % — ABNORMAL LOW (ref 10.4–31.8)
TIBC: 311 ug/dL (ref 250–450)
UIBC: 279 ug/dL

## 2021-08-24 ENCOUNTER — Encounter: Payer: Self-pay | Admitting: Physician Assistant

## 2021-08-24 ENCOUNTER — Ambulatory Visit (INDEPENDENT_AMBULATORY_CARE_PROVIDER_SITE_OTHER): Payer: BC Managed Care – PPO

## 2021-08-24 ENCOUNTER — Telehealth: Payer: Self-pay

## 2021-08-24 ENCOUNTER — Telehealth: Payer: Self-pay | Admitting: Physician Assistant

## 2021-08-24 VITALS — BP 105/71 | HR 65 | Temp 98.6°F | Resp 18 | Ht 60.0 in | Wt 126.0 lb

## 2021-08-24 DIAGNOSIS — D500 Iron deficiency anemia secondary to blood loss (chronic): Secondary | ICD-10-CM

## 2021-08-24 LAB — FERRITIN: Ferritin: 111 ng/mL (ref 11–307)

## 2021-08-24 MED ORDER — EPINEPHRINE 0.3 MG/0.3ML IJ SOAJ: 0.3000 mg | Freq: Once | INTRAMUSCULAR | Status: DC | PRN

## 2021-08-24 MED ORDER — ALBUTEROL SULFATE HFA 108 (90 BASE) MCG/ACT IN AERS: 2.0000 | INHALATION_SPRAY | Freq: Once | RESPIRATORY_TRACT | Status: DC | PRN

## 2021-08-24 MED ORDER — METHYLPREDNISOLONE SODIUM SUCC 125 MG IJ SOLR: 125.0000 mg | Freq: Once | INTRAMUSCULAR | Status: DC | PRN

## 2021-08-24 MED ORDER — FAMOTIDINE IN NACL 20-0.9 MG/50ML-% IV SOLN: 20.0000 mg | Freq: Once | INTRAVENOUS | Status: DC | PRN

## 2021-08-24 MED ORDER — DIPHENHYDRAMINE HCL 50 MG/ML IJ SOLN: 50.0000 mg | Freq: Once | INTRAMUSCULAR | Status: DC | PRN

## 2021-08-24 MED ORDER — SODIUM CHLORIDE 0.9 % IV SOLN
200.0000 mg | Freq: Once | INTRAVENOUS | Status: AC
  Administered 2021-08-24: 200 mg via INTRAVENOUS
  Filled 2021-08-24: qty 10

## 2021-08-24 MED ORDER — SODIUM CHLORIDE 0.9 % IV SOLN: Freq: Once | INTRAVENOUS | Status: DC | PRN

## 2021-08-24 NOTE — Progress Notes (Signed)
[place] [phone]   [phone]  PROGRESS NOTE  Patient Care Team: Glendale Chard, MD as PCP - General (Internal Medicine)   CHIEF COMPLAINTS/PURPOSE OF CONSULTATION:  Iron deficiency anemia  HISTORY OF PRESENTING ILLNESS:  Jennifer Manning is a 51 y.o. female returns for a follow up for iron deficiency anemia. Since the last visit on 06/30/2021, patient has received IV venofer 200 mg once weekly x 3 doses. She has the remining two doses scheduled on 08/24/2021 and 08/30/2021.   On exam today, Jennifer Manning reports that her energy levels are still low. She has not seen an improvement after receiving her iron infusions so far. 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 Her fatigue worsens throughout the day since she travels to different schools and has to bring in equipment from her car. Her appetite is stable and she denies any recent weight loss. She has intermittent episodes of nausea that without any noted triggers. She does not take any antiemetics and denies any vomiting episodes. She denies abdominal pain and adds that her bowel habits are regular. She denies easy bruising or signs of active bleeding except for her menstrual cycles which continues to be heavy and irregular. She had an IUD placed last month. She reports have a second cycle last month. On average, each cycle lasts 7 days. She stopped taking her iron pills once she began IV iron infusions. She reports having occasional episodes of headaches and dizziness without any syncopal episodes. She has shortness of breath mainly with heavy exertion. She denies fevers, chills, night sweats, chest pain or cough. She has no other complaints. Rest of the 10 point ROS is below.   MEDICAL HISTORY:  Past Medical History:  Diagnosis Date   Migraines    Post concussive syndrome     SURGICAL HISTORY: Past Surgical History:  Procedure Laterality Date   TONSILLECTOMY     TUBAL  LIGATION      SOCIAL HISTORY: Social History   Socioeconomic History   Marital status: Married    Spouse name: [name]   Number of children: 3   Years of education: Not on file   Highest education level: Not on file  Occupational History   Occupation: Electrical engineer    Employer: [employer]  Tobacco Use   Smoking status: Never   Smokeless tobacco: Never  Vaping Use   Vaping Use: Never used  Substance and Sexual Activity   Alcohol use: No   Drug use: No   Sexual activity: Not on file  Other Topics Concern   Not on file  Social History Narrative   Lives with her husband and 3 children.     Right handed   Social Determinants of Health   Financial Resource Strain: Not on file  Food Insecurity: Not on file  Transportation Needs: Not on file  Physical Activity: Not on file  Stress: Not on file  Social Connections: Not on file  Intimate Partner Violence: Not on file    FAMILY HISTORY: Family History  Problem Relation Age of Onset   Heart Problems Mother    Hyperlipidemia Mother    Hypertension Mother    Hyperlipidemia Father    Hyperlipidemia Brother    Hypertension Brother    Dementia Neg Hx     ALLERGIES:  is allergic to other and sulfa antibiotics.  MEDICATIONS:  Current Outpatient Medications  Medication Sig Dispense Refill   albuterol (VENTOLIN HFA) 108 (90 Base)  MCG/ACT inhaler Inhale 2 puffs into the lungs every 6 (six) hours as needed for wheezing or shortness of breath. 8.5 g 2   Ascorbic Acid (VITAMIN C) 100 MG tablet Take 100 mg by mouth daily.     aspirin-acetaminophen-caffeine (EXCEDRIN MIGRAINE) 250-250-65 MG per tablet Take 1-2 tablets by mouth every 6 (six) hours as needed for headache.     budesonide-formoterol (SYMBICORT) 80-4.5 MCG/ACT inhaler TAKE 2 PUFFS BY MOUTH TWICE A DAY IN THE MORNING AND IN THE EVENING 30.6 each 1   cetirizine (ZYRTEC) 10 MG tablet Take 10 mg by mouth as needed.      Cholecalciferol (VITAMIN D PO) Take 1  tablet by mouth daily. 2000units     cyclobenzaprine (FLEXERIL) 5 MG tablet One tab po qpm prn 30 tablet 0   ibuprofen (ADVIL) 800 MG tablet TAKE 1 TABLET (800 MG TOTAL) BY MOUTH DAILY AS NEEDED. 30 tablet 0   magnesium oxide (MAG-OX) 400 (241.3 Mg) MG tablet Take 400 mg by mouth daily.     Omega-3 1000 MG CAPS Take 1,000 mg by mouth daily.     UBRELVY 50 MG TABS TAKE 1 TABLET BY MOUTH DAILY AS NEEDED. 12 tablet 1   ferrous sulfate 325 (65 FE) MG EC tablet Take 1 tablet (325 mg total) by mouth daily with breakfast. (Patient not taking: Reported on 08/23/2021) 30 tablet 3   mometasone (NASONEX) 50 MCG/ACT nasal spray Place 2 sprays into the nose daily. (Patient taking differently: Place 2 sprays into the nose as needed.) 17 g 2   progesterone (PROMETRIUM) 200 MG capsule Take 200 mg by mouth daily. (Patient not taking: Reported on 08/23/2021)     No current facility-administered medications for this visit.   Facility-Administered Medications Ordered in Other Visits  Medication Dose Route Frequency Provider Last Rate Last Admin   0.9 %  sodium chloride infusion   Intravenous Once PRN Elisha Cooksey T, PA-C       albuterol (VENTOLIN HFA) 108 (90 Base) MCG/ACT inhaler 2 puff  2 puff Inhalation Once PRN Devansh Riese T, PA-C       diphenhydrAMINE (BENADRYL) injection 50 mg  50 mg Intravenous Once PRN Benoit Meech T, PA-C       EPINEPHrine (EPI-PEN) injection 0.3 mg  0.3 mg Intramuscular Once PRN Jerral Mccauley T, PA-C       famotidine (PEPCID) IVPB 20 mg premix  20 mg Intravenous Once PRN Muaad Boehning T, PA-C       iron sucrose (VENOFER) 200 mg in sodium chloride 0.9 % 100 mL IVPB  200 mg Intravenous Once Duana Benedict T, PA-C       methylPREDNISolone sodium succinate (SOLU-MEDROL) 125 mg/2 mL injection 125 mg  125 mg Intravenous Once PRN Dede Query T, PA-C        REVIEW OF SYSTEMS:   Constitutional: ( - ) fevers, ( - )  chills , ( - ) night sweats Eyes: ( - ) blurriness of vision, ( - ) double  vision, ( - ) watery eyes Ears, nose, mouth, throat, and face: ( - ) mucositis, ( - ) sore throat Respiratory: ( - ) cough, ( +) dyspnea, ( - ) wheezes Cardiovascular: ( + ) palpitation, ( - ) chest discomfort, ( - ) lower extremity swelling Gastrointestinal:  ( +) nausea, ( - ) heartburn, ( - ) change in bowel habits Skin: ( - ) abnormal skin rashes Lymphatics: ( - ) new lymphadenopathy, ( - ) easy bruising Neurological: ( - )  numbness, ( - ) tingling, ( - ) new weaknesses Behavioral/Psych: ( - ) mood change, ( - ) new changes  All other systems were reviewed with the patient and are negative.  PHYSICAL EXAMINATION: ECOG PERFORMANCE STATUS: 1 - Symptomatic but completely ambulatory  Vitals:   08/23/21 1506  BP: 134/64  Pulse: 74  Resp: 16  Temp: (!) 96.8 F (36 C)  SpO2: 100%   Filed Weights   08/23/21 1506  Weight: 123 lb 8 oz (56 kg)    GENERAL: well appearing female in NAD  SKIN: skin color, texture, turgor are normal, no rashes or significant lesions EYES: conjunctiva are pink and non-injected, sclera clear OROPHARYNX: no exudate, no erythema; lips, buccal mucosa, and tongue normal  NECK: supple, non-tender LYMPH:  no palpable lymphadenopathy in the cervical or supraclavicular lymph nodes.  LUNGS: clear to auscultation and percussion with normal breathing effort HEART: regular rate & rhythm and no murmurs and no lower extremity edema ABDOMEN: soft, non-tender, non-distended, normal bowel sounds Musculoskeletal: no cyanosis of digits and no clubbing  PSYCH: alert & oriented x 3, fluent speech NEURO: no focal motor/sensory deficits  LABORATORY DATA:  I have reviewed the data as listed CBC Latest Ref Rng & Units 08/23/2021 06/30/2021 04/26/2021  WBC 4.0 - 10.5 K/uL 4.4 4.3 4.3  Hemoglobin 12.0 - 15.0 g/dL 11.0(L) 10.6(L) 10.0(L)  Hematocrit 36.0 - 46.0 % 34.5(L) 33.6(L) 30.9(L)  Platelets 150 - 400 K/uL 312 340 395    CMP Latest Ref Rng & Units 06/30/2021 04/26/2021  08/30/2020  Glucose 70 - 99 mg/dL 82 78 83  BUN 6 - 20 mg/dL 9 9 12   Creatinine 0.44 - 1.00 mg/dL 0.81 0.68 0.79  Sodium 135 - 145 mmol/L 140 138 140  Potassium 3.5 - 5.1 mmol/L 4.0 4.5 3.7  Chloride 98 - 111 mmol/L 109 103 103  CO2 22 - 32 mmol/L 23 20 21   Calcium 8.9 - 10.3 mg/dL 8.8(L) 8.9 9.0  Total Protein 6.5 - 8.1 g/dL 7.7 - 7.0  Total Bilirubin 0.3 - 1.2 mg/dL 0.6 - <0.2  Alkaline Phos 38 - 126 U/L 55 - 54  AST 15 - 41 U/L 13(L) - 14  ALT 0 - 44 U/L 11 - 10    ASSESSMENT & PLAN Jennifer Manning is a 51 y.o. female who presents to the clinic for evaluation for iron deficiency anemia.   #Iron deficiency anemia 2/2 menstrual bleeding: --Started IV venofer 200 mg on 07/27/2021, completed 3 infusions so far. Due to complete remaining two doses on 08/24/2021 and 08/30/2021. --Labs today show improvement of anemia with Hgb 11.0. Iron panel shows ferritin has improved to 111. Serum iron is 32 and iron saturation is still 10%.  --Recommend to resume oral ferrous sulfate 325 mg once daily with a source of vitamin C.  --Proceed with remaining two IV venofer infusions. --Continue to eat iron rich foods.  --Under the care of GYN for menstrual bleeding and recently had IUD placement.  --RTC 10 weeks with repeat labs   #Hx of B12 injections: --Vitamin B12 levels were checked on 06/30/2021, without any evidence of deficiency.    No orders of the defined types were placed in this encounter.   All questions were answered. The patient knows to call the clinic with any problems, questions or concerns.  I have spent a total of 25 minutes minutes of face-to-face and non-face-to-face time, preparing to see the patient, performing a medically appropriate examination, counseling and educating the patient,  documenting clinical information in the electronic health record, and care coordination.   Dede Query, PA-C Department of Hematology/Oncology [place] at [facility] Phone: [phone]

## 2021-08-24 NOTE — Progress Notes (Signed)
Diagnosis: Iron Deficiency Anemia  Provider:  Marshell Garfinkel, MD  Procedure: Infusion  IV Type: Peripheral, IV Location: R Hand  Venofer (Iron Sucrose), Dose: 200 mg  Infusion Start Time: 3810  Infusion Stop Time: 1500  Post Infusion IV Care: Peripheral IV Discontinued  Discharge: Condition: Good, Destination: Home . AVS provided to patient.   Performed by:  Paul Dykes, RN

## 2021-08-24 NOTE — Telephone Encounter (Signed)
Pt advised with VU 

## 2021-08-24 NOTE — Telephone Encounter (Signed)
Scheduled per 2/1 los, pt has been called and confirmed

## 2021-08-24 NOTE — Telephone Encounter (Signed)
-----   Message from Lincoln Brigham, PA-C sent at 08/24/2021  9:55 AM EST ----- Please notify patient that iron levels have improved but still not in a normal range. Need to complete remaining IV iron treatments and resume oral iron. We will see patient back in 10 weeks for a follow up to repeat labs.

## 2021-08-30 ENCOUNTER — Ambulatory Visit: Payer: BC Managed Care – PPO

## 2021-08-30 MED ORDER — METHYLPREDNISOLONE SODIUM SUCC 125 MG IJ SOLR: 125.0000 mg | Freq: Once | INTRAMUSCULAR | Status: DC | PRN

## 2021-08-30 MED ORDER — FAMOTIDINE IN NACL 20-0.9 MG/50ML-% IV SOLN: 20.0000 mg | Freq: Once | INTRAVENOUS | Status: DC | PRN

## 2021-08-30 MED ORDER — EPINEPHRINE 0.3 MG/0.3ML IJ SOAJ: 0.3000 mg | Freq: Once | INTRAMUSCULAR | Status: DC | PRN

## 2021-08-30 MED ORDER — DIPHENHYDRAMINE HCL 50 MG/ML IJ SOLN: 50.0000 mg | Freq: Once | INTRAMUSCULAR | Status: DC | PRN

## 2021-08-30 MED ORDER — ALBUTEROL SULFATE HFA 108 (90 BASE) MCG/ACT IN AERS: 2.0000 | INHALATION_SPRAY | Freq: Once | RESPIRATORY_TRACT | Status: DC | PRN

## 2021-08-30 MED ORDER — SODIUM CHLORIDE 0.9 % IV SOLN
200.0000 mg | Freq: Once | INTRAVENOUS | Status: DC
  Filled 2021-08-30 (×2): qty 10

## 2021-08-30 MED ORDER — SODIUM CHLORIDE 0.9 % IV SOLN: Freq: Once | INTRAVENOUS | Status: DC | PRN

## 2021-08-31 ENCOUNTER — Ambulatory Visit (INDEPENDENT_AMBULATORY_CARE_PROVIDER_SITE_OTHER): Payer: BC Managed Care – PPO

## 2021-08-31 ENCOUNTER — Ambulatory Visit (INDEPENDENT_AMBULATORY_CARE_PROVIDER_SITE_OTHER): Payer: BC Managed Care – PPO | Admitting: Internal Medicine

## 2021-08-31 ENCOUNTER — Encounter: Payer: Self-pay | Admitting: Internal Medicine

## 2021-08-31 ENCOUNTER — Other Ambulatory Visit: Payer: Self-pay

## 2021-08-31 VITALS — BP 110/72 | HR 71 | Temp 98.2°F | Ht 60.0 in | Wt 121.2 lb

## 2021-08-31 VITALS — BP 104/72 | HR 67 | Temp 98.2°F | Resp 16 | Ht 60.0 in | Wt 121.8 lb

## 2021-08-31 DIAGNOSIS — Z6823 Body mass index (BMI) 23.0-23.9, adult: Secondary | ICD-10-CM

## 2021-08-31 DIAGNOSIS — D500 Iron deficiency anemia secondary to blood loss (chronic): Secondary | ICD-10-CM | POA: Diagnosis not present

## 2021-08-31 DIAGNOSIS — Z1211 Encounter for screening for malignant neoplasm of colon: Secondary | ICD-10-CM

## 2021-08-31 DIAGNOSIS — Z0000 Encounter for general adult medical examination without abnormal findings: Secondary | ICD-10-CM

## 2021-08-31 MED ORDER — ANTICOAGULANT SODIUM CITRATE 4% (200MG/5ML) IV SOLN
5.0000 mL | Freq: Once | Status: DC | PRN
  Filled 2021-08-31: qty 5

## 2021-08-31 MED ORDER — ALBUTEROL SULFATE HFA 108 (90 BASE) MCG/ACT IN AERS: 2.0000 | INHALATION_SPRAY | Freq: Once | RESPIRATORY_TRACT | Status: DC | PRN

## 2021-08-31 MED ORDER — EPINEPHRINE 0.3 MG/0.3ML IJ SOAJ: 0.3000 mg | Freq: Once | INTRAMUSCULAR | Status: DC | PRN

## 2021-08-31 MED ORDER — HEPARIN SOD (PORK) LOCK FLUSH 100 UNIT/ML IV SOLN: 500.0000 [IU] | Freq: Once | INTRAVENOUS | Status: DC | PRN

## 2021-08-31 MED ORDER — SODIUM CHLORIDE 0.9 % IV SOLN: Freq: Once | INTRAVENOUS | Status: DC | PRN

## 2021-08-31 MED ORDER — HEPARIN SOD (PORK) LOCK FLUSH 100 UNIT/ML IV SOLN: 250.0000 [IU] | Freq: Once | INTRAVENOUS | Status: DC | PRN

## 2021-08-31 MED ORDER — SODIUM CHLORIDE 0.9% FLUSH: 10.0000 mL | Freq: Once | INTRAVENOUS | Status: DC | PRN

## 2021-08-31 MED ORDER — FAMOTIDINE IN NACL 20-0.9 MG/50ML-% IV SOLN: 20.0000 mg | Freq: Once | INTRAVENOUS | Status: DC | PRN

## 2021-08-31 MED ORDER — SODIUM CHLORIDE 0.9 % IV SOLN
200.0000 mg | Freq: Once | INTRAVENOUS | Status: AC
  Administered 2021-08-31: 200 mg via INTRAVENOUS
  Filled 2021-08-31: qty 10

## 2021-08-31 MED ORDER — SODIUM CHLORIDE 0.9% FLUSH: 3.0000 mL | Freq: Once | INTRAVENOUS | Status: DC | PRN

## 2021-08-31 MED ORDER — METHYLPREDNISOLONE SODIUM SUCC 125 MG IJ SOLR: 125.0000 mg | Freq: Once | INTRAMUSCULAR | Status: DC | PRN

## 2021-08-31 MED ORDER — ALTEPLASE 2 MG IJ SOLR: 2.0000 mg | Freq: Once | INTRAMUSCULAR | Status: DC | PRN

## 2021-08-31 MED ORDER — DIPHENHYDRAMINE HCL 50 MG/ML IJ SOLN: 50.0000 mg | Freq: Once | INTRAMUSCULAR | Status: DC | PRN

## 2021-08-31 NOTE — Progress Notes (Signed)
Diagnosis: Iron Deficiency Anemia  Provider:  Marshell Garfinkel, MD  Procedure: Infusion  IV Type: Peripheral, IV Location: R Antecubital  Venofer (Iron Sucrose), Dose: 200 mg  Infusion Start Time: 13.17 08/31/2021  Infusion Stop Time: 13.34  08/31/2021  Post Infusion IV Care: Peripheral IV Discontinued  Discharge: Condition: Good, Destination: Home . AVS provided to patient.   Performed by:  Arnoldo Morale, RN

## 2021-08-31 NOTE — Progress Notes (Signed)
Rich Brave Llittleton,acting as a Education administrator for Jennifer Greenland, MD.,have documented all relevant documentation on the behalf of Jennifer Greenland, MD,as directed by  Jennifer Greenland, MD while in the presence of Jennifer Greenland, MD.  This visit occurred during the SARS-CoV-2 public health emergency.  Safety protocols were in place, including screening questions prior to the visit, additional usage of staff PPE, and extensive cleaning of exam room while observing appropriate contact time as indicated for disinfecting solutions.  Subjective:     Patient ID: Jennifer Manning , female    DOB: [birth year] , 51 y.o.   MRN: [id number]   Chief Complaint  Patient presents with   Annual Exam    HPI  She is here today for a full physical examination. She is followed by Dr. Cletis Media for her Gyn exams. She states she is feeling better since seeing hematology, she has had iron infusions which have been helpful.      Past Medical History:  Diagnosis Date   Migraines    Post concussive syndrome      Family History  Problem Relation Age of Onset   Heart Problems Mother    Hyperlipidemia Mother    Hypertension Mother    Hyperlipidemia Father    Hyperlipidemia Brother    Hypertension Brother    Dementia Neg Hx      Current Outpatient Medications:    albuterol (VENTOLIN HFA) 108 (90 Base) MCG/ACT inhaler, Inhale 2 puffs into the lungs every 6 (six) hours as needed for wheezing or shortness of breath., Disp: 8.5 g, Rfl: 2   Ascorbic Acid (VITAMIN C) 100 MG tablet, Take 100 mg by mouth daily., Disp: , Rfl:    aspirin-acetaminophen-caffeine (EXCEDRIN MIGRAINE) 250-250-65 MG per tablet, Take 1-2 tablets by mouth every 6 (six) hours as needed for headache., Disp: , Rfl:    cetirizine (ZYRTEC) 10 MG tablet, Take 10 mg by mouth as needed. , Disp: , Rfl:    Cholecalciferol (VITAMIN D PO), Take 1 tablet by mouth daily. 2000units, Disp: , Rfl:    cyclobenzaprine (FLEXERIL) 5 MG tablet, One tab po qpm prn, Disp:  30 tablet, Rfl: 0   ibuprofen (ADVIL) 800 MG tablet, TAKE 1 TABLET (800 MG TOTAL) BY MOUTH DAILY AS NEEDED., Disp: 30 tablet, Rfl: 0   magnesium oxide (MAG-OX) 400 (241.3 Mg) MG tablet, Take 400 mg by mouth daily., Disp: , Rfl:    mometasone (NASONEX) 50 MCG/ACT nasal spray, Place 2 sprays into the nose daily. (Patient taking differently: Place 2 sprays into the nose as needed.), Disp: 17 g, Rfl: 2   Omega-3 1000 MG CAPS, Take 1,000 mg by mouth daily., Disp: , Rfl:    UBRELVY 50 MG TABS, TAKE 1 TABLET BY MOUTH DAILY AS NEEDED., Disp: 12 tablet, Rfl: 1   budesonide-formoterol (SYMBICORT) 80-4.5 MCG/ACT inhaler, TAKE 2 PUFFS BY MOUTH TWICE A DAY IN THE MORNING AND IN THE EVENING (Patient not taking: Reported on 08/31/2021), Disp: 30.6 each, Rfl: 1 No current facility-administered medications for this visit.  Facility-Administered Medications Ordered in Other Visits:    0.9 %  sodium chloride infusion, , Intravenous, Once PRN, Thayil, Irene T, PA-C   albuterol (VENTOLIN HFA) 108 (90 Base) MCG/ACT inhaler 2 puff, 2 puff, Inhalation, Once PRN, Thayil, Irene T, PA-C   diphenhydrAMINE (BENADRYL) injection 50 mg, 50 mg, Intravenous, Once PRN, Thayil, Irene T, PA-C   EPINEPHrine (EPI-PEN) injection 0.3 mg, 0.3 mg, Intramuscular, Once PRN, Lincoln Brigham, PA-C  famotidine (PEPCID) IVPB 20 mg premix, 20 mg, Intravenous, Once PRN, Thayil, Irene T, PA-C   iron sucrose (VENOFER) 200 mg in sodium chloride 0.9 % 100 mL IVPB, 200 mg, Intravenous, Once, Thayil, Irene T, PA-C   methylPREDNISolone sodium succinate (SOLU-MEDROL) 125 mg/2 mL injection 125 mg, 125 mg, Intravenous, Once PRN, Thayil, Irene T, PA-C   Allergies  Allergen Reactions   Other Anaphylaxis    Shellfish    Sulfa Antibiotics Anaphylaxis    The patient states she uses IUD for birth control. Last LMP was Patient's last menstrual period was 08/19/2021.Marland Kitchen Positive for Menorrhagia. Negative for: breast discharge, breast lump(s), breast pain and  breast self exam. Associated symptoms include abnormal vaginal bleeding. Pertinent negatives include abnormal bleeding (hematology), anxiety, decreased libido, depression, difficulty falling sleep, dyspareunia, history of infertility, nocturia, sexual dysfunction, sleep disturbances, urinary incontinence, urinary urgency, vaginal discharge and vaginal itching. Diet regular.The patient states her exercise level is  minimal.   . The patient's tobacco use is:  Social History   Tobacco Use  Smoking Status Never  Smokeless Tobacco Never  . She has been exposed to passive smoke. The patient's alcohol use is:  Social History   Substance and Sexual Activity  Alcohol Use No    Review of Systems  Constitutional: Negative.   HENT: Negative.    Eyes: Negative.   Respiratory: Negative.    Cardiovascular: Negative.   Gastrointestinal: Negative.   Endocrine: Negative.   Genitourinary: Negative.   Musculoskeletal: Negative.   Skin: Negative.   Allergic/Immunologic: Negative.   Neurological: Negative.   Hematological: Negative.   Psychiatric/Behavioral: Negative.      Today's Vitals   08/31/21 1011  BP: 110/72  Pulse: 71  Temp: 98.2 F (36.8 C)  Weight: 121 lb 3.2 oz (55 kg)  Height: 5' (1.524 m)  PainSc: 0-No pain   Body mass index is 23.67 kg/m.  Wt Readings from Last 3 Encounters:  08/31/21 121 lb 12.8 oz (55.2 kg)  08/31/21 121 lb 3.2 oz (55 kg)  08/24/21 126 lb (57.2 kg)     Objective:  Physical Exam Vitals and nursing note reviewed.  Constitutional:      Appearance: Normal appearance.  HENT:     Head: Normocephalic and atraumatic.     Right Ear: Tympanic membrane, ear canal and external ear normal.     Left Ear: Tympanic membrane, ear canal and external ear normal.     Nose:     Comments: Masked     Mouth/Throat:     Comments: Masked  Eyes:     Extraocular Movements: Extraocular movements intact.     Conjunctiva/sclera: Conjunctivae normal.     Pupils: Pupils are  equal, round, and reactive to light.  Cardiovascular:     Rate and Rhythm: Normal rate and regular rhythm.     Pulses: Normal pulses.     Heart sounds: Normal heart sounds.  Pulmonary:     Effort: Pulmonary effort is normal.     Breath sounds: Normal breath sounds.  Chest:  Breasts:    Tanner Score is 5.     Right: Normal.     Left: Normal.  Abdominal:     General: Abdomen is flat. Bowel sounds are normal.     Palpations: Abdomen is soft.  Genitourinary:    Comments: deferred Musculoskeletal:        General: Normal range of motion.     Cervical back: Normal range of motion and neck supple.  Skin:  General: Skin is warm and dry.  Neurological:     General: No focal deficit present.     Mental Status: She is alert and oriented to person, place, and time.  Psychiatric:        Mood and Affect: Mood normal.        Behavior: Behavior normal.        Assessment And Plan:     1. Encounter for general adult medical examination w/o abnormal findings Comments: A full exam was performed. Importance of monthly self breast exams was discussed with the patient. I will refer her to GI for CRC screening.  PATIENT IS ADVISED TO GET 30-45 MINUTES REGULAR EXERCISE NO LESS THAN FOUR TO FIVE DAYS PER WEEK - BOTH WEIGHTBEARING EXERCISES AND AEROBIC ARE RECOMMENDED.  PATIENT IS ADVISED TO FOLLOW A HEALTHY DIET WITH AT LEAST SIX FRUITS/VEGGIES PER DAY, DECREASE INTAKE OF RED MEAT, AND TO INCREASE FISH INTAKE TO TWO DAYS PER WEEK.  MEATS/FISH SHOULD NOT BE FRIED, BAKED OR BROILED IS PREFERABLE.  IT IS ALSO IMPORTANT TO CUT BACK ON YOUR SUGAR INTAKE. PLEASE AVOID ANYTHING WITH ADDED SUGAR, CORN SYRUP OR OTHER SWEETENERS. IF YOU MUST USE A SWEETENER, YOU CAN TRY STEVIA. IT IS ALSO IMPORTANT TO AVOID ARTIFICIALLY SWEETENERS AND DIET BEVERAGES. LASTLY, I SUGGEST WEARING SPF 50 SUNSCREEN ON EXPOSED PARTS AND ESPECIALLY WHEN IN THE DIRECT SUNLIGHT FOR AN EXTENDED PERIOD OF TIME.  PLEASE AVOID FAST FOOD  RESTAURANTS AND INCREASE YOUR WATER INTAKE.  - Lipid panel - BMP8+eGFR - Ambulatory referral to Gastroenterology  2. Screening for colon cancer Comments: I will refer her to GI for CRC screening.  - Ambulatory referral to Gastroenterology  3. BMI 23.0-23.9, adult Comments: She is encouraged to gradually increase her daily activity, aiming for 30 minutes four to five days per week.   Patient was given opportunity to ask questions. Patient verbalized understanding of the plan and was able to repeat key elements of the plan. All questions were answered to their satisfaction.   Rush Landmark, MD, have reviewed all documentation for this visit. The documentation on 08/31/21 for the exam, diagnosis, procedures, and orders are all accurate and complete.   THE PATIENT IS ENCOURAGED TO PRACTICE SOCIAL DISTANCING DUE TO THE COVID-19 PANDEMIC.

## 2021-08-31 NOTE — Patient Instructions (Signed)

## 2021-09-01 LAB — BMP8+EGFR
BUN/Creatinine Ratio: 17 (ref 9–23)
BUN: 12 mg/dL (ref 6–24)
CO2: 19 mmol/L — ABNORMAL LOW (ref 20–29)
Calcium: 9 mg/dL (ref 8.7–10.2)
Chloride: 104 mmol/L (ref 96–106)
Creatinine, Ser: 0.7 mg/dL (ref 0.57–1.00)
Glucose: 80 mg/dL (ref 70–99)
Potassium: 4.4 mmol/L (ref 3.5–5.2)
Sodium: 138 mmol/L (ref 134–144)
eGFR: 104 mL/min/{1.73_m2} (ref >59)

## 2021-09-01 LAB — LIPID PANEL
Chol/HDL Ratio: 2.9 ratio (ref 0.0–4.4)
Cholesterol, Total: 191 mg/dL (ref 100–199)
HDL: 67 mg/dL (ref >39)
LDL Chol Calc (NIH): 116 mg/dL — ABNORMAL HIGH (ref 0–99)
Triglycerides: 42 mg/dL (ref 0–149)
VLDL Cholesterol Cal: 8 mg/dL (ref 5–40)

## 2021-09-12 ENCOUNTER — Other Ambulatory Visit: Payer: Self-pay | Admitting: Pharmacy Technician

## 2021-11-01 ENCOUNTER — Other Ambulatory Visit: Payer: Self-pay

## 2021-11-01 ENCOUNTER — Inpatient Hospital Stay: Payer: BC Managed Care – PPO | Admitting: Hematology and Oncology

## 2021-11-01 ENCOUNTER — Inpatient Hospital Stay: Payer: BC Managed Care – PPO | Attending: Physician Assistant

## 2021-11-01 VITALS — BP 114/72 | HR 68 | Temp 97.6°F | Resp 18 | Ht 60.0 in | Wt 127.6 lb

## 2021-11-01 DIAGNOSIS — D500 Iron deficiency anemia secondary to blood loss (chronic): Secondary | ICD-10-CM

## 2021-11-01 DIAGNOSIS — D509 Iron deficiency anemia, unspecified: Secondary | ICD-10-CM | POA: Insufficient documentation

## 2021-11-01 LAB — CBC WITH DIFFERENTIAL (CANCER CENTER ONLY)
Abs Immature Granulocytes: 0 10*3/uL (ref 0.00–0.07)
Basophils Absolute: 0 10*3/uL (ref 0.0–0.1)
Basophils Relative: 1 %
Eosinophils Absolute: 0.1 10*3/uL (ref 0.0–0.5)
Eosinophils Relative: 2 %
HCT: 39.6 % (ref 36.0–46.0)
Hemoglobin: 13 g/dL (ref 12.0–15.0)
Immature Granulocytes: 0 %
Lymphocytes Relative: 35 %
Lymphs Abs: 1.7 10*3/uL (ref 0.7–4.0)
MCH: 30.2 pg (ref 26.0–34.0)
MCHC: 32.8 g/dL (ref 30.0–36.0)
MCV: 92.1 fL (ref 80.0–100.0)
Monocytes Absolute: 0.3 10*3/uL (ref 0.1–1.0)
Monocytes Relative: 7 %
Neutro Abs: 2.7 10*3/uL (ref 1.7–7.7)
Neutrophils Relative %: 55 %
Platelet Count: 267 10*3/uL (ref 150–400)
RBC: 4.3 MIL/uL (ref 3.87–5.11)
RDW: 16.1 % — ABNORMAL HIGH (ref 11.5–15.5)
WBC Count: 4.8 10*3/uL (ref 4.0–10.5)
nRBC: 0 % (ref 0.0–0.2)

## 2021-11-01 LAB — IRON AND IRON BINDING CAPACITY (CC-WL,HP ONLY)
Iron: 58 ug/dL (ref 28–170)
Saturation Ratios: 21 % (ref 10.4–31.8)
TIBC: 280 ug/dL (ref 250–450)
UIBC: 222 ug/dL (ref 148–442)

## 2021-11-01 LAB — FERRITIN: Ferritin: 59 ng/mL (ref 11–307)

## 2021-11-01 NOTE — Progress Notes (Signed)
?[facility] ?[phone]   [phone] ? ?PROGRESS NOTE ? ?Patient Care Team: ?Glendale Chard, MD as PCP - General (Internal Medicine) ? ?Hematological/Oncological History ?# Iron Deficiency Anemia 2/2 to GYN Bleeding ?06/30/2021: establish care with Dede Query ?07/27/2021-08/30/2021: Patient received IV iron sucrose 200 mg IV x 5 doses ?11/01/2021: Labs show white blood cell count 4.8, hemoglobin 13.0, MCV 92.1, and platelets of 267 ? ?Interval History:  ?Jennifer Manning 51 y.o. female with medical history significant for iron deficiency anemia secondary to GYN bleeding who presents for a follow up visit. The patient's last visit was on 08/23/2021. In the interim since the last visit she has completed her IV iron therapy. ? ?On exam today Jennifer Manning notes that she tolerated the IV iron therapy well after her first dose.  Her first dose she has been difficult as she had not eaten she began getting dizzy and nauseated in the chair.  She also felt like her bowels are about release and she felt northbound fatigue.  She had a rough reaction bad food on her stomach for subsequent treatments.  She notes that overall she feels good afterwards and she is feeling considerably less fatigued.  She also notes that her IUD is working well she is not having as heavy menstrual cycles.  She is not having bleeding from any other sources.  She currently denies any fevers, chills, sweats, nausea, vomiting or diarrhea.  Full 10 point ROS is listed below. ? ?MEDICAL HISTORY:  ?Past Medical History:  ?Diagnosis Date  ? Migraines   ? Post concussive syndrome   ? ? ?SURGICAL HISTORY: ?Past Surgical History:  ?Procedure Laterality Date  ? TONSILLECTOMY    ? TUBAL LIGATION    ? ? ?SOCIAL HISTORY: ?Social History  ? ?Socioeconomic History  ? Marital status: Married  ?  Spouse name: [name]  ? Number of children: 3  ? Years of education: Not on file  ? Highest education level: Not on file  ?Occupational History  ?  Occupation: Electrical engineer  ?  Employer: [employer]  ?Tobacco Use  ? Smoking status: Never  ? Smokeless tobacco: Never  ?Vaping Use  ? Vaping Use: Never used  ?Substance and Sexual Activity  ? Alcohol use: No  ? Drug use: No  ? Sexual activity: Not on file  ?Other Topics Concern  ? Not on file  ?Social History Narrative  ? Lives with her husband and 3 children.    ? Right handed  ? ?Social Determinants of Health  ? ?Financial Resource Strain: Not on file  ?Food Insecurity: Not on file  ?Transportation Needs: Not on file  ?Physical Activity: Not on file  ?Stress: Not on file  ?Social Connections: Not on file  ?Intimate Partner Violence: Not on file  ? ? ?FAMILY HISTORY: ?Family History  ?Problem Relation Age of Onset  ? Heart Problems Mother   ? Hyperlipidemia Mother   ? Hypertension Mother   ? Hyperlipidemia Father   ? Hyperlipidemia Brother   ? Hypertension Brother   ? Dementia Neg Hx   ? ? ?ALLERGIES:  is allergic to other and sulfa antibiotics. ? ?MEDICATIONS:  ?Current Outpatient Medications  ?Medication Sig Dispense Refill  ? albuterol (VENTOLIN HFA) 108 (90 Base) MCG/ACT inhaler Inhale 2 puffs into the lungs every 6 (six) hours as needed for wheezing or shortness of breath. 8.5 g 2  ? Ascorbic Acid (VITAMIN C) 100 MG tablet Take 100 mg by mouth daily.    ?  aspirin-acetaminophen-caffeine (EXCEDRIN MIGRAINE) 250-250-65 MG per tablet Take 1-2 tablets by mouth every 6 (six) hours as needed for headache.    ? budesonide-formoterol (SYMBICORT) 80-4.5 MCG/ACT inhaler TAKE 2 PUFFS BY MOUTH TWICE A DAY IN THE MORNING AND IN THE EVENING (Patient not taking: Reported on 08/31/2021) 30.6 each 1  ? cetirizine (ZYRTEC) 10 MG tablet Take 10 mg by mouth as needed.     ? Cholecalciferol (VITAMIN D PO) Take 1 tablet by mouth daily. 2000units    ? cyclobenzaprine (FLEXERIL) 5 MG tablet One tab po qpm prn 30 tablet 0  ? ibuprofen (ADVIL) 800 MG tablet TAKE 1 TABLET (800 MG TOTAL) BY MOUTH DAILY AS NEEDED. 30 tablet  0  ? magnesium oxide (MAG-OX) 400 (241.3 Mg) MG tablet Take 400 mg by mouth daily.    ? mometasone (NASONEX) 50 MCG/ACT nasal spray Place 2 sprays into the nose daily. (Patient taking differently: Place 2 sprays into the nose as needed.) 17 g 2  ? Omega-3 1000 MG CAPS Take 1,000 mg by mouth daily.    ? UBRELVY 50 MG TABS TAKE 1 TABLET BY MOUTH DAILY AS NEEDED. 12 tablet 1  ? ?No current facility-administered medications for this visit.  ? ? ?REVIEW OF SYSTEMS:   ?Constitutional: ( - ) fevers, ( - )  chills , ( - ) night sweats ?Eyes: ( - ) blurriness of vision, ( - ) double vision, ( - ) watery eyes ?Ears, nose, mouth, throat, and face: ( - ) mucositis, ( - ) sore throat ?Respiratory: ( - ) cough, ( - ) dyspnea, ( - ) wheezes ?Cardiovascular: ( - ) palpitation, ( - ) chest discomfort, ( - ) lower extremity swelling ?Gastrointestinal:  ( - ) nausea, ( - ) heartburn, ( - ) change in bowel habits ?Skin: ( - ) abnormal skin rashes ?Lymphatics: ( - ) new lymphadenopathy, ( - ) easy bruising ?Neurological: ( - ) numbness, ( - ) tingling, ( - ) new weaknesses ?Behavioral/Psych: ( - ) mood change, ( - ) new changes  ?All other systems were reviewed with the patient and are negative. ? ?PHYSICAL EXAMINATION: ? ?Vitals:  ? 11/01/21 1119  ?BP: 114/72  ?Pulse: 68  ?Resp: 18  ?Temp: 97.6 ?F (36.4 ?C)  ?SpO2: 100%  ? ?Filed Weights  ? 11/01/21 1119  ?Weight: 127 lb 9.6 oz (57.9 kg)  ? ? ?GENERAL: Well-appearing middle-aged African-American female, alert, no distress and comfortable ?SKIN: skin color, texture, turgor are normal, no rashes or significant lesions ?EYES: conjunctiva are pink and non-injected, sclera clear ?LUNGS: clear to auscultation and percussion with normal breathing effort ?HEART: regular rate & rhythm and no murmurs and no lower extremity edema ?Musculoskeletal: no cyanosis of digits and no clubbing  ?PSYCH: alert & oriented x 3, fluent speech ?NEURO: no focal motor/sensory deficits ? ?LABORATORY DATA:  ?I have  reviewed the data as listed ? ?  Latest Ref Rng & Units 11/01/2021  ? 10:49 AM 08/23/2021  ?  2:52 PM 06/30/2021  ? 10:16 AM  ?CBC  ?WBC 4.0 - 10.5 K/uL 4.8   4.4   4.3    ?Hemoglobin 12.0 - 15.0 g/dL 13.0   11.0   10.6    ?Hematocrit 36.0 - 46.0 % 39.6   34.5   33.6    ?Platelets 150 - 400 K/uL 267   312   340    ? ? ? ?  Latest Ref Rng & Units 08/31/2021  ? 10:50  AM 06/30/2021  ? 10:16 AM 04/26/2021  ? 10:26 AM  ?CMP  ?Glucose 70 - 99 mg/dL 80   82   78    ?BUN 6 - 24 mg/dL '12   9   9    '$ ?Creatinine 0.57 - 1.00 mg/dL 0.70   0.81   0.68    ?Sodium 134 - 144 mmol/L 138   140   138    ?Potassium 3.5 - 5.2 mmol/L 4.4   4.0   4.5    ?Chloride 96 - 106 mmol/L 104   109   103    ?CO2 20 - 29 mmol/L '19   23   20    '$ ?Calcium 8.7 - 10.2 mg/dL 9.0   8.8   8.9    ?Total Protein 6.5 - 8.1 g/dL  7.7     ?Total Bilirubin 0.3 - 1.2 mg/dL  0.6     ?Alkaline Phos 38 - 126 U/L  55     ?AST 15 - 41 U/L  13     ?ALT 0 - 44 U/L  11     ? ?RADIOGRAPHIC STUDIES: ?No results found. ? ?ASSESSMENT & PLAN ?Jennifer Manning 51 y.o. female with medical history significant for iron deficiency anemia secondary to GYN bleeding who presents for a follow up visit. ? ?#Iron deficiency anemia 2/2 menstrual bleeding: ?-- Completed 5 doses of IV Venofer 200 mg  ?--Labs show white blood cell count 4.8, hemoglobin 13.0, MCV 92.1, and platelets of 267 ?--Continue to eat iron rich foods.  ?--Under the care of GYN for menstrual bleeding and had IUD placement.  ?--RTC 6 months ?  ?#Hx of B12 injections: ?--Vitamin B12 levels were checked on 06/30/2021, without any evidence of deficiency.  ? ?No orders of the defined types were placed in this encounter. ? ? ?All questions were answered. The patient knows to call the clinic with any problems, questions or concerns. ? ?A total of more than 30 minutes were spent on this encounter with face-to-face time and non-face-to-face time, including preparing to see the patient, ordering tests and/or medications, counseling the  patient and coordination of care as outlined above.  ? ?Ledell Peoples, MD ?Department of Hematology/Oncology ?[place] [facility] ?Phone: [phone] ?Pager: [phone]

## 2021-11-08 LAB — HM COLONOSCOPY

## 2021-11-21 ENCOUNTER — Ambulatory Visit: Payer: BC Managed Care – PPO | Admitting: Nurse Practitioner

## 2021-11-21 ENCOUNTER — Encounter: Payer: Self-pay | Admitting: Nurse Practitioner

## 2021-11-21 VITALS — BP 116/62 | HR 60 | Temp 98.4°F | Ht 60.0 in | Wt 126.0 lb

## 2021-11-21 DIAGNOSIS — U071 COVID-19: Secondary | ICD-10-CM

## 2021-11-21 DIAGNOSIS — H9203 Otalgia, bilateral: Secondary | ICD-10-CM

## 2021-11-21 DIAGNOSIS — H6690 Otitis media, unspecified, unspecified ear: Secondary | ICD-10-CM | POA: Diagnosis not present

## 2021-11-21 MED ORDER — FLUCONAZOLE 100 MG PO TABS: 100.0000 mg | ORAL_TABLET | Freq: Every day | ORAL | 0 refills | Status: DC

## 2021-11-21 MED ORDER — AMOXICILLIN 875 MG PO TABS: 875.0000 mg | ORAL_TABLET | Freq: Two times a day (BID) | ORAL | 0 refills | Status: DC

## 2021-11-21 MED ORDER — NIRMATRELVIR/RITONAVIR (PAXLOVID)TABLET: 3.0000 | ORAL_TABLET | Freq: Two times a day (BID) | ORAL | 0 refills | Status: AC

## 2021-11-21 NOTE — Progress Notes (Signed)
?Industrial/product designer as a Education administrator for [employer], FNP.,have documented all relevant documentation on the behalf of [name], FNP,as directed by  [name], FNP while in the presence of [name], [place]. ? ?This visit occurred during the SARS-CoV-2 public health emergency.  Safety protocols were in place, including screening questions prior to the visit, additional usage of staff PPE, and extensive cleaning of exam room while observing appropriate contact time as indicated for disinfecting solutions. ? ?Subjective:  ?  ? Patient ID: Jennifer Manning , female    DOB: 1970-11-23 , 51 y.o.   MRN: [id number] ? ? ?Chief Complaint  ?Patient presents with  ? Ear Pain  ? ? ?HPI ? ?Patient presents today for ear pain. She was not sure if these symptoms are related to her allergies. Right ear is worse.   ? ?Otalgia  ?There is pain in both ears. This is a new problem. The current episode started in the past 7 days (Saturday). The problem has been unchanged. There has been no fever (temp of 99 on Sunday). Associated symptoms include coughing and headaches. Pertinent negatives include no sore throat. Treatments tried: dayquil, mucinex cough. There is no history of a chronic ear infection.   ? ?Past Medical History:  ?Diagnosis Date  ? Migraines   ? Post concussive syndrome   ?  ? ?Family History  ?Problem Relation Age of Onset  ? Heart Problems Mother   ? Hyperlipidemia Mother   ? Hypertension Mother   ? Hyperlipidemia Father   ? Hyperlipidemia Brother   ? Hypertension Brother   ? Dementia Neg Hx   ? ? ? ?Current Outpatient Medications:  ?  amoxicillin (AMOXIL) 875 MG tablet, Take 1 tablet (875 mg total) by mouth 2 (two) times daily., Disp: 14 tablet, Rfl: 0 ?  fluconazole (DIFLUCAN) 100 MG tablet, Take 1 tablet (100 mg total) by mouth daily. Take 1 tablet by mouth now repeat in 5 days, Disp: 2 tablet, Rfl: 0 ?  albuterol (VENTOLIN HFA) 108 (90 Base) MCG/ACT inhaler, Inhale 2 puffs into the lungs every 6 (six) hours as  needed for wheezing or shortness of breath., Disp: 8.5 g, Rfl: 2 ?  Ascorbic Acid (VITAMIN C) 100 MG tablet, Take 100 mg by mouth daily., Disp: , Rfl:  ?  aspirin-acetaminophen-caffeine (EXCEDRIN MIGRAINE) 250-250-65 MG per tablet, Take 1-2 tablets by mouth every 6 (six) hours as needed for headache., Disp: , Rfl:  ?  budesonide-formoterol (SYMBICORT) 80-4.5 MCG/ACT inhaler, TAKE 2 PUFFS BY MOUTH TWICE A DAY IN THE MORNING AND IN THE EVENING (Patient not taking: Reported on 08/31/2021), Disp: 30.6 each, Rfl: 1 ?  cetirizine (ZYRTEC) 10 MG tablet, Take 10 mg by mouth as needed. , Disp: , Rfl:  ?  Cholecalciferol (VITAMIN D PO), Take 1 tablet by mouth daily. 2000units, Disp: , Rfl:  ?  cyclobenzaprine (FLEXERIL) 5 MG tablet, One tab po qpm prn, Disp: 30 tablet, Rfl: 0 ?  ibuprofen (ADVIL) 800 MG tablet, TAKE 1 TABLET (800 MG TOTAL) BY MOUTH DAILY AS NEEDED., Disp: 30 tablet, Rfl: 0 ?  magnesium oxide (MAG-OX) 400 (241.3 Mg) MG tablet, Take 400 mg by mouth daily., Disp: , Rfl:  ?  mometasone (NASONEX) 50 MCG/ACT nasal spray, Place 2 sprays into the nose daily. (Patient taking differently: Place 2 sprays into the nose as needed.), Disp: 17 g, Rfl: 2 ?  Omega-3 1000 MG CAPS, Take 1,000 mg by mouth daily., Disp: , Rfl:  ?  UBRELVY 50 MG  TABS, TAKE 1 TABLET BY MOUTH DAILY AS NEEDED., Disp: 12 tablet, Rfl: 1  ? ?Allergies  ?Allergen Reactions  ? Other Anaphylaxis  ?  Shellfish ?  ? Sulfa Antibiotics Anaphylaxis  ?  ? ?Review of Systems  ?Constitutional: Negative.   ?HENT:  Positive for congestion and ear pain. Negative for sore throat.   ?Respiratory:  Positive for cough.   ?Cardiovascular: Negative.   ?Gastrointestinal: Negative.   ?Neurological:  Positive for headaches. Negative for dizziness.  ?Psychiatric/Behavioral: Negative.     ? ?Today's Vitals  ? 11/21/21 1417  ?BP: 116/62  ?Pulse: 60  ?Temp: 98.4 ?F (36.9 ?C)  ?TempSrc: Oral  ?Weight: 126 lb (57.2 kg)  ?Height: 5' (1.524 m)  ? ?Body mass index is 24.61 kg/m?.  ?Wt  Readings from Last 3 Encounters:  ?11/21/21 126 lb (57.2 kg)  ?11/01/21 127 lb 9.6 oz (57.9 kg)  ?08/31/21 121 lb 12.8 oz (55.2 kg)  ? ? ?Objective:  ?Physical Exam ?Vitals reviewed.  ?Constitutional:   ?   General: She is not in acute distress. ?   Appearance: Normal appearance.  ?HENT:  ?   Head: Normocephalic.  ?   Right Ear: Tympanic membrane and external ear normal. There is no impacted cerumen.  ?   Left Ear: Tympanic membrane and external ear normal. There is no impacted cerumen.  ?   Ears:  ?   Comments: Erythematous ear canal bilateral ?   Nose: Congestion present. No nasal tenderness.  ?   Right Sinus: No maxillary sinus tenderness or frontal sinus tenderness.  ?   Left Sinus: No maxillary sinus tenderness or frontal sinus tenderness.  ?Cardiovascular:  ?   Rate and Rhythm: Normal rate and regular rhythm.  ?   Pulses: Normal pulses.  ?   Heart sounds: Normal heart sounds. No murmur heard. ?Pulmonary:  ?   Effort: Pulmonary effort is normal. No respiratory distress.  ?   Breath sounds: Normal breath sounds. No wheezing.  ?Neurological:  ?   General: No focal deficit present.  ?   Mental Status: She is alert and oriented to person, place, and time.  ?   Cranial Nerves: No cranial nerve deficit.  ?Psychiatric:     ?   Mood and Affect: Mood normal.     ?   Behavior: Behavior normal.     ?   Thought Content: Thought content normal.     ?   Judgment: Judgment normal.  ?  ? ?   ?Assessment And Plan:  ?   ?1. Otalgia of both ears ?- nirmatrelvir/ritonavir EUA (PAXLOVID) 20 x 150 MG & 10 x '100MG'$  TABS; Take 3 tablets by mouth 2 (two) times daily for 5 days. Patient GFR is 104. ?Take nirmatrelvir (150 mg) two tablets twice daily for 5 days and ritonavir (100 mg) one tablet twice daily for 5 days.  Dispense: 30 tablet; Refill: 0 ? ?2. Acute otitis media, unspecified otitis media type ?Comments: Will treat with antibiotics ?- amoxicillin (AMOXIL) 875 MG tablet; Take 1 tablet (875 mg total) by mouth 2 (two) times daily.   Dispense: 14 tablet; Refill: 0 ?- fluconazole (DIFLUCAN) 100 MG tablet; Take 1 tablet (100 mg total) by mouth daily. Take 1 tablet by mouth now repeat in 5 days  Dispense: 2 tablet; Refill: 0 ?- POC COVID-19 ? ?3. COVID-19 ?Comments: positive rapid covid, advised to remain in quarantine for 5 days. Will also treat with paxlovid. ?Advised patient to take Vitamin C, D, Zinc.  Keep yourself hydrated with a lot of water and rest. Take Delsym for cough and Mucinex as need. Take Tylenol or pain reliever every 4-6 hours as needed for pain/fever/body ache. If you have elevated blood pressure, you can take OTC Corcidin. You can also take OTC oscillococcinum to help with your symptoms.  Educated patient if symptoms get worse or if she experiences any SOB, chest pain or pain in her legs to seek immediate emergency care. Continue to monitor your oxygen levels. Call us if you have any questions. Quarantine for 5 days if tested positive and no symptoms or 10 days if tested positive and have symptoms. Wear a mask around other people.  ?- nirmatrelvir/ritonavir EUA (PAXLOVID) 20 x 150 MG & 10 x '100MG'$  TABS; Take 3 tablets by mouth 2 (two) times daily for 5 days. Patient GFR is 104. ?Take nirmatrelvir (150 mg) two tablets twice daily for 5 days and ritonavir (100 mg) one tablet twice daily for 5 days.  Dispense: 30 tablet; Refill: 0 ?  ? ? ?Patient was given opportunity to ask questions. Patient verbalized understanding of the plan and was able to repeat key elements of the plan. All questions were answered to their satisfaction.  ?[name], FNP  ? ? ?I, [name], FNP, have reviewed all documentation for this visit. The documentation on 11/21/21 for the exam, diagnosis, procedures, and orders are all accurate and complete.  ? ?IF YOU HAVE BEEN REFERRED TO A SPECIALIST, IT MAY TAKE 1-2 WEEKS TO SCHEDULE/PROCESS THE REFERRAL. IF YOU HAVE NOT HEARD FROM US/SPECIALIST IN TWO WEEKS, PLEASE GIVE [place] A CALL AT [phone] X [phone].   ? ?THE PATIENT IS ENCOURAGED TO PRACTICE SOCIAL DISTANCING DUE TO THE COVID-19 PANDEMIC.   ?

## 2021-11-21 NOTE — Patient Instructions (Signed)
Earache, Adult ?An earache, or ear pain, can be caused by many things, including: ?An infection. ?Ear wax buildup. ?Ear pressure. ?Something in the ear that should not be there (foreign body). ?A sore throat. ?Tooth problems. ?Jaw problems. ?Treatment of the earache will depend on the cause. If the cause is not clear or cannot be determined, you may need to watch your symptoms until your earache goes away or until a cause is found. ?Follow these instructions at home: ?Medicines ?Take or apply over-the-counter and prescription medicines only as told by your health care provider. ?If you were prescribed an antibiotic medicine, use it as told by your health care provider. Do not stop using the antibiotic even if you start to feel better. ?Do not put anything in your ear other than medicine that is prescribed by your health care provider. ?Managing pain ?If directed, apply heat to the affected area as often as told by your health care provider. Use the heat source that your health care provider recommends, such as a moist heat pack or a heating pad. ?Place a towel between your skin and the heat source. ?Leave the heat on for 20-30 minutes. ?Remove the heat if your skin turns bright red. This is especially important if you are unable to feel pain, heat, or cold. You may have a greater risk of getting burned. ?If directed, put ice on the affected area as often as told by your health care provider. To do this: ? ?  ? ?Put ice in a plastic bag. ?Place a towel between your skin and the bag. ?Leave the ice on for 20 minutes, 2-3 times a day. ?General instructions ?Pay attention to any changes in your symptoms. ?Try resting in an upright position instead of lying down. This may help to reduce pressure in your ear and relieve pain. ?Chew gum if it helps to relieve your ear pain. ?Treat any allergies as told by your health care provider. ?Drink enough fluid to keep your urine pale yellow. ?It is up to you to get the results of  any tests that were done. Ask your health care provider, or the department that is doing the tests, when your results will be ready. ?Keep all follow-up visits as told by your health care provider. This is important. ?Contact a health care provider if: ?Your pain does not improve within 2 days. ?Your earache gets worse. ?You have new symptoms. ?You have a fever. ?Get help right away if you: ?Have a severe headache. ?Have a stiff neck. ?Have trouble swallowing. ?Have redness or swelling behind your ear. ?Have fluid or blood coming from your ear. ?Have hearing loss. ?Feel dizzy. ?Summary ?An earache, or ear pain, can be caused by many things. ?Treatment of the earache will depend on the cause. Follow recommendations from your health care provider to treat your ear pain. ?If the cause is not clear or cannot be determined, you may need to watch your symptoms until your earache goes away or until a cause is found. ?Keep all follow-up visits as told by your health care provider. This is important. ?This information is not intended to replace advice given to you by your health care provider. Make sure you discuss any questions you have with your health care provider. ?Document Revised: 02/13/2019 Document Reviewed: 02/14/2019 ?Elsevier Patient Education ? 2023 [employer] ? ?

## 2021-11-22 LAB — POC COVID19 BINAXNOW: SARS Coronavirus 2 Ag: POSITIVE — AB

## 2021-11-24 ENCOUNTER — Telehealth: Payer: Self-pay

## 2021-11-24 NOTE — Telephone Encounter (Signed)
I called and left pt vm letting her know her PA for Roselyn Meier has been approved [id number],[id number] ?

## 2022-01-01 LAB — HM MAMMOGRAPHY: HM Mammogram: NORMAL (ref 0–4)

## 2022-01-11 LAB — HM PAP SMEAR
HM Pap smear: ABNORMAL
HPV, high-risk: NEGATIVE

## 2022-01-27 ENCOUNTER — Other Ambulatory Visit: Payer: Self-pay | Admitting: Internal Medicine

## 2022-02-15 ENCOUNTER — Encounter: Payer: Self-pay | Admitting: Internal Medicine

## 2022-02-15 ENCOUNTER — Ambulatory Visit: Payer: BC Managed Care – PPO | Admitting: Internal Medicine

## 2022-02-15 VITALS — BP 112/78 | HR 65 | Temp 98.5°F | Ht 60.0 in | Wt 128.0 lb

## 2022-02-15 DIAGNOSIS — Z862 Personal history of diseases of the blood and blood-forming organs and certain disorders involving the immune mechanism: Secondary | ICD-10-CM | POA: Diagnosis not present

## 2022-02-15 DIAGNOSIS — R42 Dizziness and giddiness: Secondary | ICD-10-CM

## 2022-02-15 DIAGNOSIS — Z6825 Body mass index (BMI) 25.0-25.9, adult: Secondary | ICD-10-CM

## 2022-02-15 DIAGNOSIS — R5383 Other fatigue: Secondary | ICD-10-CM | POA: Diagnosis not present

## 2022-02-15 NOTE — Progress Notes (Signed)
Rich Brave [place],acting as a Education administrator for Maximino Greenland, MD.,have documented all relevant documentation on the behalf of Maximino Greenland, MD,as directed by  Maximino Greenland, MD while in the presence of Maximino Greenland, MD.    Subjective:     Patient ID: Jennifer Manning , female    DOB: 1971-01-28 , 51 y.o.   MRN: [id number]   Chief Complaint  Patient presents with   Fatigue    HPI  Patient presents today for fatigue. She would like some bloodwork done. She states her sx started about 3 weeks ago. She experienced dizziness last weekend. She states she was walking from the hotel to her car last Saturday when the dizziness occurred. She did not pass out. She stopped walking and held her head, after a couple of minutes her sx improved. She denies menorrhagia. She now has IUD, does experience spotting on occasion.      Past Medical History:  Diagnosis Date   Migraines    Post concussive syndrome      Family History  Problem Relation Age of Onset   Heart Problems Mother    Hyperlipidemia Mother    Hypertension Mother    Hyperlipidemia Father    Hyperlipidemia Brother    Hypertension Brother    Dementia Neg Hx      Current Outpatient Medications:    albuterol (VENTOLIN HFA) 108 (90 Base) MCG/ACT inhaler, Inhale 2 puffs into the lungs every 6 (six) hours as needed for wheezing or shortness of breath., Disp: 8.5 g, Rfl: 2   amoxicillin (AMOXIL) 875 MG tablet, Take 1 tablet (875 mg total) by mouth 2 (two) times daily., Disp: 14 tablet, Rfl: 0   Ascorbic Acid (VITAMIN C) 100 MG tablet, Take 100 mg by mouth daily., Disp: , Rfl:    aspirin-acetaminophen-caffeine (EXCEDRIN MIGRAINE) 250-250-65 MG per tablet, Take 1-2 tablets by mouth every 6 (six) hours as needed for headache., Disp: , Rfl:    cetirizine (ZYRTEC) 10 MG tablet, Take 10 mg by mouth as needed. , Disp: , Rfl:    Cholecalciferol (VITAMIN D PO), Take 1 tablet by mouth daily. 2000units, Disp: , Rfl:    cyclobenzaprine  (FLEXERIL) 5 MG tablet, One tab po qpm prn, Disp: 30 tablet, Rfl: 0   fluconazole (DIFLUCAN) 100 MG tablet, Take 1 tablet (100 mg total) by mouth daily. Take 1 tablet by mouth now repeat in 5 days, Disp: 2 tablet, Rfl: 0   ibuprofen (ADVIL) 800 MG tablet, TAKE 1 TABLET (800 MG TOTAL) BY MOUTH DAILY AS NEEDED., Disp: 30 tablet, Rfl: 0   magnesium oxide (MAG-OX) 400 (241.3 Mg) MG tablet, Take 400 mg by mouth daily., Disp: , Rfl:    mometasone (NASONEX) 50 MCG/ACT nasal spray, Place 2 sprays into the nose daily. (Patient taking differently: Place 2 sprays into the nose as needed.), Disp: 17 g, Rfl: 2   Omega-3 1000 MG CAPS, Take 1,000 mg by mouth daily., Disp: , Rfl:    UBRELVY 50 MG TABS, TAKE 1 TABLET BY MOUTH DAILY AS NEEDED., Disp: 12 tablet, Rfl: 1   budesonide-formoterol (SYMBICORT) 80-4.5 MCG/ACT inhaler, TAKE 2 PUFFS BY MOUTH TWICE A DAY IN THE MORNING AND IN THE EVENING (Patient not taking: Reported on 08/31/2021), Disp: 30.6 each, Rfl: 1   Allergies  Allergen Reactions   Other Anaphylaxis    Shellfish    Sulfa Antibiotics Anaphylaxis     Review of Systems  Constitutional:  Positive for fatigue.  HENT:  Positive  for sinus pressure and sinus pain.   Respiratory: Negative.    Cardiovascular: Negative.   Gastrointestinal: Negative.   Neurological: Negative.   Psychiatric/Behavioral: Negative.       Today's Vitals   02/15/22 1530  BP: 112/78  Pulse: 65  Temp: 98.5 F (36.9 C)  Weight: 128 lb (58.1 kg)  Height: 5' (1.524 m)  PainSc: 3   PainLoc: Head   Body mass index is 25 kg/m.   Objective:  Physical Exam Vitals and nursing note reviewed.  Constitutional:      Appearance: Normal appearance.  HENT:     Head: Normocephalic and atraumatic.     Right Ear: Tympanic membrane, ear canal and external ear normal. There is no impacted cerumen.     Left Ear: Tympanic membrane, ear canal and external ear normal. There is no impacted cerumen.     Nose: Congestion present. No  rhinorrhea.  Eyes:     Extraocular Movements: Extraocular movements intact.  Cardiovascular:     Rate and Rhythm: Normal rate and regular rhythm.     Heart sounds: Normal heart sounds.  Pulmonary:     Effort: Pulmonary effort is normal.     Breath sounds: Normal breath sounds.  Musculoskeletal:     Cervical back: Normal range of motion.  Skin:    General: Skin is warm.  Neurological:     General: No focal deficit present.     Mental Status: She is alert.  Psychiatric:        Mood and Affect: Mood normal.        Behavior: Behavior normal.      Assessment And Plan:     1. Fatigue, unspecified type Comments: She is encouraged to stay well hydrated. May be mineral deficient. May benefit from dash of Celtic sea salt in one bottle of water daily.  - CBC no Diff - Iron, TIBC and Ferritin Panel - Vitamin B12 - CMP14+EGFR  2. Dizziness Comments: Again, encouraged to stay well hydrated. Advised to incorporate coconut water into her weekly routine.   3. BMI 25.0-25.9,adult Comments: She is encouraged to aim for at least 150 minutes of exercise per week.   4. History of iron deficiency anemia Comments: I will check CBC and iron panel. I will make further recommendations once her labs are available for review.  - Vitamin B12     Patient was given opportunity to ask questions. Patient verbalized understanding of the plan and was able to repeat key elements of the plan. All questions were answered to their satisfaction.   I, Maximino Greenland, MD, have reviewed all documentation for this visit. The documentation on 02/15/22 for the exam, diagnosis, procedures, and orders are all accurate and complete.   IF YOU HAVE BEEN REFERRED TO A SPECIALIST, IT MAY TAKE 1-2 WEEKS TO SCHEDULE/PROCESS THE REFERRAL. IF YOU HAVE NOT HEARD FROM US/SPECIALIST IN TWO WEEKS, PLEASE GIVE [place] A CALL AT [phone].   THE PATIENT IS ENCOURAGED TO PRACTICE SOCIAL DISTANCING DUE TO THE COVID-19 PANDEMIC.

## 2022-02-16 LAB — CMP14+EGFR
ALT: 10 IU/L (ref 0–32)
AST: 14 IU/L (ref 0–40)
Albumin/Globulin Ratio: 1.6 (ref 1.2–2.2)
Albumin: 4.4 g/dL (ref 3.8–4.9)
Alkaline Phosphatase: 62 IU/L (ref 44–121)
BUN/Creatinine Ratio: 20 (ref 9–23)
BUN: 14 mg/dL (ref 6–24)
Bilirubin Total: 0.3 mg/dL (ref 0.0–1.2)
CO2: 21 mmol/L (ref 20–29)
Calcium: 9.2 mg/dL (ref 8.7–10.2)
Chloride: 102 mmol/L (ref 96–106)
Creatinine, Ser: 0.71 mg/dL (ref 0.57–1.00)
Globulin, Total: 2.7 g/dL (ref 1.5–4.5)
Glucose: 79 mg/dL (ref 70–99)
Potassium: 5.2 mmol/L (ref 3.5–5.2)
Sodium: 137 mmol/L (ref 134–144)
Total Protein: 7.1 g/dL (ref 6.0–8.5)
eGFR: 102 mL/min/1.73

## 2022-02-16 LAB — CBC
Hematocrit: 41.4 % (ref 34.0–46.6)
Hemoglobin: 13.6 g/dL (ref 11.1–15.9)
MCH: 30.8 pg (ref 26.6–33.0)
MCHC: 32.9 g/dL (ref 31.5–35.7)
MCV: 94 fL (ref 79–97)
Platelets: 326 10*3/uL (ref 150–450)
RBC: 4.41 x10E6/uL (ref 3.77–5.28)
RDW: 12.6 % (ref 11.7–15.4)
WBC: 7.4 10*3/uL (ref 3.4–10.8)

## 2022-02-16 LAB — VITAMIN B12: Vitamin B-12: 937 pg/mL (ref 232–1245)

## 2022-02-16 LAB — IRON,TIBC AND FERRITIN PANEL
Ferritin: 62 ng/mL (ref 15–150)
Iron Saturation: 17 % (ref 15–55)
Iron: 43 ug/dL (ref 27–159)
Total Iron Binding Capacity: 254 ug/dL (ref 250–450)
UIBC: 211 ug/dL (ref 131–425)

## 2022-02-26 ENCOUNTER — Other Ambulatory Visit: Payer: Self-pay | Admitting: Internal Medicine

## 2022-03-07 ENCOUNTER — Telehealth: Payer: Self-pay

## 2022-03-08 NOTE — Telephone Encounter (Signed)
Error

## 2022-03-27 ENCOUNTER — Other Ambulatory Visit: Payer: Self-pay | Admitting: Internal Medicine

## 2022-05-06 ENCOUNTER — Other Ambulatory Visit: Payer: Self-pay | Admitting: Internal Medicine

## 2022-05-15 ENCOUNTER — Ambulatory Visit (INDEPENDENT_AMBULATORY_CARE_PROVIDER_SITE_OTHER): Payer: BC Managed Care – PPO | Admitting: Nurse Practitioner

## 2022-05-15 ENCOUNTER — Encounter: Payer: Self-pay | Admitting: Nurse Practitioner

## 2022-05-15 VITALS — BP 108/78 | HR 66 | Temp 98.1°F | Ht 60.0 in | Wt 130.4 lb

## 2022-05-15 DIAGNOSIS — R0989 Other specified symptoms and signs involving the circulatory and respiratory systems: Secondary | ICD-10-CM | POA: Diagnosis not present

## 2022-05-15 DIAGNOSIS — R059 Cough, unspecified: Secondary | ICD-10-CM | POA: Diagnosis not present

## 2022-05-15 DIAGNOSIS — H9209 Otalgia, unspecified ear: Secondary | ICD-10-CM | POA: Diagnosis not present

## 2022-05-15 DIAGNOSIS — H938X2 Other specified disorders of left ear: Secondary | ICD-10-CM | POA: Diagnosis not present

## 2022-05-15 MED ORDER — AMOXICILLIN-POT CLAVULANATE 875-125 MG PO TABS: 1.0000 | ORAL_TABLET | Freq: Two times a day (BID) | ORAL | 0 refills | Status: DC

## 2022-05-15 MED ORDER — MOMETASONE FUROATE 50 MCG/ACT NA SUSP
2.0000 | Freq: Every day | NASAL | 2 refills | Status: AC
Start: 2022-05-15 — End: 2024-04-29

## 2022-05-15 NOTE — Progress Notes (Signed)
I,Jennifer Manning,acting as a Education administrator for Jennifer Brine, FNP.,have documented all relevant documentation on the behalf of Jennifer Brine, FNP,as directed by  Jennifer Brine, FNP while in the presence of Jennifer Manning, [place].    Subjective:     Patient ID: Jennifer Manning , female    DOB: 01-03-1971 , 51 y.o.   MRN: [id number]   No chief complaint on file.   HPI  Patient presents today with pain in ear, congestion & coughing since Saturday. She has taken mucinex with some relief as needed. She is becoming more winded when walking. Has not used her inhaler  She did not take an at home covid test.       Past Medical History:  Diagnosis Date   Migraines    Post concussive syndrome      Family History  Problem Relation Age of Onset   Heart Problems Mother    Hyperlipidemia Mother    Hypertension Mother    Hyperlipidemia Father    Hyperlipidemia Brother    Hypertension Brother    Dementia Neg Hx      Current Outpatient Medications:    albuterol (VENTOLIN HFA) 108 (90 Base) MCG/ACT inhaler, Inhale 2 puffs into the lungs every 6 (six) hours as needed for wheezing or shortness of breath., Disp: 8.5 g, Rfl: 2   amoxicillin-clavulanate (AUGMENTIN) 875-125 MG tablet, Take 1 tablet by mouth 2 (two) times daily., Disp: 20 tablet, Rfl: 0   Ascorbic Acid (VITAMIN C) 100 MG tablet, Take 100 mg by mouth daily., Disp: , Rfl:    aspirin-acetaminophen-caffeine (EXCEDRIN MIGRAINE) 250-250-65 MG per tablet, Take 1-2 tablets by mouth every 6 (six) hours as needed for headache., Disp: , Rfl:    cetirizine (ZYRTEC) 10 MG tablet, Take 10 mg by mouth as needed. , Disp: , Rfl:    Cholecalciferol (VITAMIN D PO), Take 1 tablet by mouth daily. 2000units, Disp: , Rfl:    cyclobenzaprine (FLEXERIL) 5 MG tablet, One tab po qpm prn, Disp: 30 tablet, Rfl: 0   ibuprofen (ADVIL) 800 MG tablet, TAKE 1 TABLET (800 MG TOTAL) BY MOUTH DAILY AS NEEDED., Disp: 30 tablet, Rfl: 0   magnesium oxide (MAG-OX) 400 (241.3 Mg)  MG tablet, Take 400 mg by mouth daily., Disp: , Rfl:    mometasone (NASONEX) 50 MCG/ACT nasal spray, Place 2 sprays into the nose daily., Disp: 1 each, Rfl: 2   Omega-3 1000 MG CAPS, Take 1,000 mg by mouth daily., Disp: , Rfl:    UBRELVY 50 MG TABS, TAKE 1 TABLET BY MOUTH DAILY AS NEEDED., Disp: 12 tablet, Rfl: 1   budesonide-formoterol (SYMBICORT) 80-4.5 MCG/ACT inhaler, TAKE 2 PUFFS BY MOUTH TWICE A DAY IN THE MORNING AND IN THE EVENING (Patient not taking: Reported on 08/31/2021), Disp: 30.6 each, Rfl: 1   Allergies  Allergen Reactions   Other Anaphylaxis    Shellfish    Sulfa Antibiotics Anaphylaxis     Review of Systems  Constitutional: Negative.   HENT:  Positive for ear pain and sinus pressure.   Respiratory: Negative.    Cardiovascular: Negative.   Neurological: Negative.   Psychiatric/Behavioral: Negative.       Today's Vitals   05/15/22 1225  BP: 108/78  Pulse: 66  Temp: 98.1 F (36.7 C)  SpO2: 98%  Weight: 130 lb 6.4 oz (59.1 kg)  Height: 5' (1.524 m)  PainSc: 0-No pain   Body mass index is 25.47 kg/m.  Wt Readings from Last 3 Encounters:  05/15/22 130 lb  6.4 oz (59.1 kg)  02/15/22 128 lb (58.1 kg)  11/21/21 126 lb (57.2 kg)    Objective:  Physical Exam Vitals reviewed.  Constitutional:      General: She is not in acute distress.    Appearance: Normal appearance.  HENT:     Head: Normocephalic.     Right Ear: Tympanic membrane and external ear normal. There is no impacted cerumen.     Left Ear: Tympanic membrane and external ear normal. There is no impacted cerumen.     Ears:     Comments: Erythematous ear canal bilateral    Nose: Congestion present. No nasal tenderness.     Right Sinus: No maxillary sinus tenderness or frontal sinus tenderness.     Left Sinus: No maxillary sinus tenderness or frontal sinus tenderness.  Cardiovascular:     Rate and Rhythm: Normal rate and regular rhythm.     Pulses: Normal pulses.     Heart sounds: Normal heart  sounds. No murmur heard. Pulmonary:     Effort: Pulmonary effort is normal. No respiratory distress.     Breath sounds: Normal breath sounds. No wheezing.  Neurological:     General: No focal deficit present.     Mental Status: She is alert and oriented to person, place, and time.     Cranial Nerves: No cranial nerve deficit.  Psychiatric:        Mood and Affect: Mood normal.        Behavior: Behavior normal.        Thought Content: Thought content normal.        Judgment: Judgment normal.         Assessment And Plan:     1. Cough, unspecified type Comments: Rapid covid negative, will send PCR. - Novel Coronavirus, NAA ([facility]) - POC COVID-19 BinaxNow - amoxicillin-clavulanate (AUGMENTIN) 875-125 MG tablet; Take 1 tablet by mouth 2 (two) times daily.  Dispense: 20 tablet; Refill: 0 - mometasone (NASONEX) 50 MCG/ACT nasal spray; Place 2 sprays into the nose daily.  Dispense: 1 each; Refill: 2  2. Chest congestion Comments: She has a history of severe upper respiratory infections, will treat with antibiotic - amoxicillin-clavulanate (AUGMENTIN) 875-125 MG tablet; Take 1 tablet by mouth 2 (two) times daily.  Dispense: 20 tablet; Refill: 0 - mometasone (NASONEX) 50 MCG/ACT nasal spray; Place 2 sprays into the nose daily.  Dispense: 1 each; Refill: 2  3. Ear ache  4. Pressure sensation in left ear     Patient was given opportunity to ask questions. Patient verbalized understanding of the plan and was able to repeat key elements of the plan. All questions were answered to their satisfaction.  Jennifer Brine, FNP   I, Jennifer Brine, FNP, have reviewed all documentation for this visit. The documentation on 05/15/22 for the exam, diagnosis, procedures, and orders are all accurate and complete.   IF YOU HAVE BEEN REFERRED TO A SPECIALIST, IT MAY TAKE 1-2 WEEKS TO SCHEDULE/PROCESS THE REFERRAL. IF YOU HAVE NOT HEARD FROM US/SPECIALIST IN TWO WEEKS, PLEASE GIVE [place] A CALL AT [phone] X  [phone].   THE PATIENT IS ENCOURAGED TO PRACTICE SOCIAL DISTANCING DUE TO THE COVID-19 PANDEMIC.

## 2022-05-16 LAB — NOVEL CORONAVIRUS, NAA: SARS-CoV-2, NAA: NOT DETECTED

## 2022-05-27 LAB — POC COVID19 BINAXNOW

## 2022-05-30 ENCOUNTER — Other Ambulatory Visit: Payer: Self-pay | Admitting: Nurse Practitioner

## 2022-05-30 MED ORDER — DOXYCYCLINE MONOHYDRATE 100 MG PO CAPS: 100.0000 mg | ORAL_CAPSULE | Freq: Two times a day (BID) | ORAL | 0 refills | Status: DC

## 2022-06-28 ENCOUNTER — Other Ambulatory Visit: Payer: Self-pay | Admitting: Internal Medicine

## 2022-06-28 DIAGNOSIS — R002 Palpitations: Secondary | ICD-10-CM

## 2022-06-30 ENCOUNTER — Ambulatory Visit
Admission: EM | Admit: 2022-06-30 | Discharge: 2022-06-30 | Disposition: A | Discharge status: Home / Self Care | Payer: BC Managed Care – PPO | Attending: Physician Assistant | Admitting: Physician Assistant

## 2022-06-30 DIAGNOSIS — R002 Palpitations: Secondary | ICD-10-CM

## 2022-06-30 LAB — POCT URINALYSIS DIP (MANUAL ENTRY)
Bilirubin, UA: NEGATIVE
Glucose, UA: NEGATIVE mg/dL
Ketones, POC UA: NEGATIVE mg/dL
Leukocytes, UA: NEGATIVE
Nitrite, UA: NEGATIVE
Protein Ur, POC: NEGATIVE mg/dL
Spec Grav, UA: 1.015 (ref 1.010–1.025)
Urobilinogen, UA: 0.2 E.U./dL
pH, UA: 7 (ref 5.0–8.0)

## 2022-06-30 NOTE — ED Provider Notes (Signed)
[facility]    CSN: [id number] Arrival date & time: 06/30/22  0915      History   Chief Complaint No chief complaint on file.   HPI Jennifer Manning is a 51 y.o. female.   Patient here c/w palpitations x 4 days.  She denies chest pain, SOB, n/v.  No changes in medications. Chart review notes she has seen cardiology last year for palpitations.  She had normal ECHO, ETT, and wore an event monitor for 2 weeks which was also normal.  Cardiology believed sx due to recent COVID pneumonia dx.  Cardiology to see her as needed basis.  Patient requesting to check for dehydration and blood work.    Past Medical History:  Diagnosis Date   Migraines    Post concussive syndrome     Patient Active Problem List   Diagnosis Date Noted   Iron deficiency anemia due to chronic blood loss 06/30/2021   Shortness of breath 06/28/2020   Cough 06/28/2020   Chronic migraine without aura without status migrainosus, not intractable 10/28/2018   Vestibular disequilibrium 10/28/2018   Cervicalgia 04/20/2018   Dizziness 09/17/2016   Headache 09/17/2016   Post concussion syndrome 05/17/2016   Vitamin D deficiency 12/20/2013    Past Surgical History:  Procedure Laterality Date   TONSILLECTOMY     TUBAL LIGATION      OB History   No obstetric history on file.      Home Medications    Prior to Admission medications   Medication Sig Start Date End Date Taking? Authorizing Provider  albuterol (VENTOLIN HFA) 108 (90 Base) MCG/ACT inhaler Inhale 2 puffs into the lungs every 6 (six) hours as needed for wheezing or shortness of breath. 06/13/21   Bary Castilla, NP  Ascorbic Acid (VITAMIN C) 100 MG tablet Take 100 mg by mouth daily.    [provider]  aspirin-acetaminophen-caffeine (EXCEDRIN MIGRAINE) [phone] MG per tablet Take 1-2 tablets by mouth every 6 (six) hours as needed for headache.    [provider]  budesonide-formoterol (SYMBICORT) 80-4.5 MCG/ACT  inhaler TAKE 2 PUFFS BY MOUTH TWICE A DAY IN THE MORNING AND IN THE EVENING Patient not taking: Reported on 08/31/2021 06/13/21   Bary Castilla, NP  cetirizine (ZYRTEC) 10 MG tablet Take 10 mg by mouth as needed.     [provider]  Cholecalciferol (VITAMIN D PO) Take 1 tablet by mouth daily. 2000units    [provider]  cyclobenzaprine (FLEXERIL) 5 MG tablet One tab po qpm prn 04/11/20   Glendale Chard, MD  doxycycline (MONODOX) 100 MG capsule Take 1 capsule (100 mg total) by mouth 2 (two) times daily. 05/30/22   [name], FNP  ibuprofen (ADVIL) 800 MG tablet TAKE 1 TABLET (800 MG TOTAL) BY MOUTH DAILY AS NEEDED. 03/27/22   Glendale Chard, MD  magnesium oxide (MAG-OX) 400 (241.3 Mg) MG tablet Take 400 mg by mouth daily.    [provider]  mometasone (NASONEX) 50 MCG/ACT nasal spray Place 2 sprays into the nose daily. 05/15/22 05/15/23  [name], FNP  Omega-3 1000 MG CAPS Take 1,000 mg by mouth daily.    [provider]  UBRELVY 50 MG TABS TAKE 1 TABLET BY MOUTH DAILY AS NEEDED. 11/30/20   Glendale Chard, MD    Family History Family History  Problem Relation Age of Onset   Heart Problems Mother    Hyperlipidemia Mother    Hypertension Mother    Hyperlipidemia Father    Hyperlipidemia  Brother    Hypertension Brother    Dementia Neg Hx     Social History Social History   Tobacco Use   Smoking status: Never   Smokeless tobacco: Never  Vaping Use   Vaping Use: Never used  Substance Use Topics   Alcohol use: No   Drug use: No     Allergies   Other and Sulfa antibiotics   Review of Systems Review of Systems  Constitutional:  Negative for chills, fatigue and fever.  Respiratory:  Negative for cough, shortness of breath and wheezing.   Cardiovascular:  Positive for palpitations. Negative for chest pain and leg swelling.  Gastrointestinal:  Negative for nausea and vomiting.  Musculoskeletal:  Positive for back pain. Negative for  arthralgias and myalgias.  Skin:  Negative for color change.  Neurological:  Negative for weakness, light-headedness and headaches.     Physical Exam Triage Vital Signs ED Triage Vitals  Enc Vitals Group     BP 06/30/22 0929 (!) 143/78     Pulse Rate 06/30/22 0929 60     Resp 06/30/22 0929 20     Temp 06/30/22 0929 98.4 F (36.9 C)     Temp Source 06/30/22 0929 Oral     SpO2 06/30/22 0929 98 %     Weight --      Height --      Head Circumference --      Peak Flow --      Pain Score 06/30/22 0932 4     Pain Loc --      Pain Edu? --      Excl. in [place]? --    No data found.  Updated Vital Signs BP (!) 143/78 (BP Location: Left Arm)   Pulse 60   Temp 98.4 F (36.9 C) (Oral)   Resp 20   LMP 06/27/2022   SpO2 98%   Visual Acuity Right Eye Distance:   Left Eye Distance:   Bilateral Distance:    Right Eye Near:   Left Eye Near:    Bilateral Near:     Physical Exam Vitals and nursing note reviewed.  Constitutional:      General: She is not in acute distress.    Appearance: Normal appearance. She is not ill-appearing.  HENT:     Head: Normocephalic and atraumatic.  Eyes:     General: No scleral icterus.    Extraocular Movements: Extraocular movements intact.     Conjunctiva/sclera: Conjunctivae normal.  Cardiovascular:     Rate and Rhythm: Normal rate and regular rhythm.     Heart sounds: No murmur heard. Pulmonary:     Effort: Pulmonary effort is normal. No respiratory distress.     Breath sounds: Normal breath sounds. No wheezing or rales.  Musculoskeletal:     Cervical back: Normal range of motion. No rigidity.  Lymphadenopathy:     Cervical: No cervical adenopathy.  Skin:    Coloration: Skin is not jaundiced.     Findings: No rash.  Neurological:     General: No focal deficit present.     Mental Status: She is alert and oriented to person, place, and time.     Motor: No weakness.     Gait: Gait normal.  Psychiatric:        Mood and Affect: Mood  normal.        Behavior: Behavior normal.      UC Treatments / Results  Labs (all labs ordered are listed, but only abnormal results  are displayed) Labs Reviewed  POCT URINALYSIS DIP (MANUAL ENTRY) - Abnormal; Notable for the following components:      Result Value   Blood, UA trace-intact (*)    All other components within normal limits  CBC  COMPREHENSIVE METABOLIC PANEL  POCT URINALYSIS DIP (MANUAL ENTRY)    EKG EKG with NSR  Radiology No results found.  Procedures Procedures (including critical care time)  Medications Ordered in UC Medications - No data to display  Initial Impression / Assessment and Plan / UC Course  I have reviewed the triage vital signs and the nursing notes.  Pertinent labs & imaging results that were available during my care of the patient were reviewed by me and considered in my medical decision making (see chart for details).     EKG reassuring Vital signs stable Exam unremarkable - heart RRR w/o mumurs.   Recommend follow up with PCP / cardiology Labs at patient request, additional labs need to be done at PCP / cardiology Strict ED precautions provided Final Clinical Impressions(s) / UC Diagnoses   Final diagnoses:  Palpitations     Discharge Instructions      Go to ED if you have worsening symptoms such as chest pain, shortness of breath, dizziness, light headed, or any other worsening symptoms Follow up with PCP or cardiology Lab results typically available via mychart in 1 - 2 days.     ED Prescriptions   None    PDMP not reviewed this encounter.   Peri Jefferson, PA-C 06/30/22 1029

## 2022-06-30 NOTE — Discharge Instructions (Addendum)
Go to ED if you have worsening symptoms such as chest pain, shortness of breath, dizziness, light headed, or any other worsening symptoms Follow up with PCP or cardiology Lab results typically available via mychart in 1 - 2 days.

## 2022-06-30 NOTE — ED Triage Notes (Signed)
Pt reports her chest beats faster at certain times that are more noticeable, she can feel it. She feels right low back pain is concerned about kidney problems. Symptoms started Tuesday. No new meds to note.

## 2022-07-02 ENCOUNTER — Emergency Department (HOSPITAL_BASED_OUTPATIENT_CLINIC_OR_DEPARTMENT_OTHER): Payer: BC Managed Care – PPO

## 2022-07-02 ENCOUNTER — Emergency Department (HOSPITAL_BASED_OUTPATIENT_CLINIC_OR_DEPARTMENT_OTHER)
Admission: EM | Admit: 2022-07-02 | Discharge: 2022-07-03 | Disposition: A | Discharge status: Home / Self Care | Payer: BC Managed Care – PPO | Attending: Emergency Medicine | Admitting: Emergency Medicine

## 2022-07-02 ENCOUNTER — Other Ambulatory Visit: Payer: Self-pay

## 2022-07-02 ENCOUNTER — Encounter (HOSPITAL_BASED_OUTPATIENT_CLINIC_OR_DEPARTMENT_OTHER): Payer: Self-pay | Admitting: Emergency Medicine

## 2022-07-02 DIAGNOSIS — Z7982 Long term (current) use of aspirin: Secondary | ICD-10-CM | POA: Diagnosis not present

## 2022-07-02 DIAGNOSIS — Z1152 Encounter for screening for COVID-19: Secondary | ICD-10-CM | POA: Diagnosis not present

## 2022-07-02 DIAGNOSIS — R200 Anesthesia of skin: Secondary | ICD-10-CM | POA: Insufficient documentation

## 2022-07-02 DIAGNOSIS — R0602 Shortness of breath: Secondary | ICD-10-CM | POA: Diagnosis not present

## 2022-07-02 LAB — COMPREHENSIVE METABOLIC PANEL
ALT: 12 IU/L (ref 0–32)
AST: 11 IU/L (ref 0–40)
Albumin/Globulin Ratio: 1.7 (ref 1.2–2.2)
Albumin: 4.3 g/dL (ref 3.8–4.9)
Alkaline Phosphatase: 56 IU/L (ref 44–121)
BUN/Creatinine Ratio: 17 (ref 9–23)
BUN: 12 mg/dL (ref 6–24)
Bilirubin Total: 0.5 mg/dL (ref 0.0–1.2)
CO2: 22 mmol/L (ref 20–29)
Calcium: 9.1 mg/dL (ref 8.7–10.2)
Chloride: 105 mmol/L (ref 96–106)
Creatinine, Ser: 0.69 mg/dL (ref 0.57–1.00)
Globulin, Total: 2.6 g/dL (ref 1.5–4.5)
Glucose: 80 mg/dL (ref 70–99)
Potassium: 4.3 mmol/L (ref 3.5–5.2)
Sodium: 139 mmol/L (ref 134–144)
Total Protein: 6.9 g/dL (ref 6.0–8.5)
eGFR: 104 mL/min/{1.73_m2} (ref >59)

## 2022-07-02 LAB — CBC WITH DIFFERENTIAL/PLATELET
Abs Immature Granulocytes: 0.01 10*3/uL (ref 0.00–0.07)
Basophils Absolute: 0.1 10*3/uL (ref 0.0–0.1)
Basophils Relative: 1 %
Eosinophils Absolute: 0.4 10*3/uL (ref 0.0–0.5)
Eosinophils Relative: 4 %
HCT: 39.1 % (ref 36.0–46.0)
Hemoglobin: 13.1 g/dL (ref 12.0–15.0)
Immature Granulocytes: 0 %
Lymphocytes Relative: 36 %
Lymphs Abs: 2.9 10*3/uL (ref 0.7–4.0)
MCH: 31.7 pg (ref 26.0–34.0)
MCHC: 33.5 g/dL (ref 30.0–36.0)
MCV: 94.7 fL (ref 80.0–100.0)
Monocytes Absolute: 0.6 10*3/uL (ref 0.1–1.0)
Monocytes Relative: 7 %
Neutro Abs: 4.2 10*3/uL (ref 1.7–7.7)
Neutrophils Relative %: 52 %
Platelets: 193 10*3/uL (ref 150–400)
RBC: 4.13 MIL/uL (ref 3.87–5.11)
RDW: 13.2 % (ref 11.5–15.5)
WBC: 8.2 10*3/uL (ref 4.0–10.5)
nRBC: 0 % (ref 0.0–0.2)

## 2022-07-02 LAB — CBC
Hematocrit: 42.6 % (ref 34.0–46.6)
Hemoglobin: 13.5 g/dL (ref 11.1–15.9)
MCH: 31 pg (ref 26.6–33.0)
MCHC: 31.7 g/dL (ref 31.5–35.7)
MCV: 98 fL — ABNORMAL HIGH (ref 79–97)
Platelets: 307 10*3/uL (ref 150–450)
RBC: 4.35 x10E6/uL (ref 3.77–5.28)
RDW: 12.5 % (ref 11.7–15.4)
WBC: 5.3 10*3/uL (ref 3.4–10.8)

## 2022-07-02 LAB — BASIC METABOLIC PANEL
Anion gap: 6 (ref 5–15)
BUN: 15 mg/dL (ref 6–20)
CO2: 23 mmol/L (ref 22–32)
Calcium: 8.5 mg/dL — ABNORMAL LOW (ref 8.9–10.3)
Chloride: 107 mmol/L (ref 98–111)
Creatinine, Ser: 0.86 mg/dL (ref 0.44–1.00)
GFR, Estimated: >60 mL/min (ref >60)
Glucose, Bld: 90 mg/dL (ref 70–99)
Potassium: 3.9 mmol/L (ref 3.5–5.1)
Sodium: 136 mmol/L (ref 135–145)

## 2022-07-02 NOTE — ED Triage Notes (Signed)
Patient arrived via POV c/o SHOB at rest x 3 days, with left arm numbness x 6 hrs. Patient seen Saturday for palpitations. Patient states some epigastric pain. Patient denies pain at this time. Patient is AO x 4, VS WDL, normal gait.

## 2022-07-02 NOTE — ED Provider Notes (Signed)
[facility] [facility] Provider Note: Georgena Spurling, MD, FACEP  CSN: [id number] MRN: [id number] ARRIVAL: 07/02/22 at 2145 ROOM: [place]  07/02/22 11:38 PM Jennifer Manning is a 51 y.o. female who has had episodic shortness of breath at rest for the past 3 days.  Nothing brings these episodes on.  She feels an increase in respiratory rate as if she had just exerted herself but is noted these occur at rest.  She does not have pain with these.  She has not had a cough or other URI symptoms.  She was seen on 06/30/2022 for palpitations and had an unremarkable workup she was referred to cardiology.  She is not short of breath at the present time.  For about the last 6 hours she has had some numbness and paresthesias in the left forearm in the pattern of the ulnar nerve.  This is worse with palpation of the ulnar nerve at the left elbow.  She is not having neck pain with this and there is no change in the numbness with movement of the neck.   Past Medical History:  Diagnosis Date   Migraines    Post concussive syndrome     Past Surgical History:  Procedure Laterality Date   TONSILLECTOMY     TUBAL LIGATION      Family History  Problem Relation Age of Onset   Heart Problems Mother    Hyperlipidemia Mother    Hypertension Mother    Hyperlipidemia Father    Hyperlipidemia Brother    Hypertension Brother    Dementia Neg Hx     Social History   Tobacco Use   Smoking status: Never   Smokeless tobacco: Never  Vaping Use   Vaping Use: Never used  Substance Use Topics   Alcohol use: No   Drug use: No    Prior to Admission medications   Medication Sig Start Date End Date Taking? Authorizing Provider  albuterol (VENTOLIN HFA) 108 (90 Base) MCG/ACT inhaler Inhale 2 puffs into the lungs every 6 (six) hours as needed for wheezing or shortness of breath. 06/13/21   Bary Castilla, NP  Ascorbic Acid  (VITAMIN C) 100 MG tablet Take 100 mg by mouth daily.    [provider]  aspirin-acetaminophen-caffeine (EXCEDRIN MIGRAINE) [phone] MG per tablet Take 1-2 tablets by mouth every 6 (six) hours as needed for headache.    [provider]  cetirizine (ZYRTEC) 10 MG tablet Take 10 mg by mouth as needed.     [provider]  Cholecalciferol (VITAMIN D PO) Take 1 tablet by mouth daily. 2000units    [provider]  cyclobenzaprine (FLEXERIL) 5 MG tablet One tab po qpm prn 04/11/20   Glendale Chard, MD  doxycycline (MONODOX) 100 MG capsule Take 1 capsule (100 mg total) by mouth 2 (two) times daily. 05/30/22   [name], FNP  ibuprofen (ADVIL) 800 MG tablet TAKE 1 TABLET (800 MG TOTAL) BY MOUTH DAILY AS NEEDED. 03/27/22   Glendale Chard, MD  magnesium oxide (MAG-OX) 400 (241.3 Mg) MG tablet Take 400 mg by mouth daily.    [provider]  mometasone (NASONEX) 50 MCG/ACT nasal spray Place 2 sprays into the nose daily. 05/15/22 05/15/23  [name], FNP  Omega-3 1000 MG CAPS Take 1,000 mg by mouth daily.    [provider]  UBRELVY 50 MG TABS TAKE 1 TABLET BY MOUTH DAILY AS NEEDED.  11/30/20   Glendale Chard, MD    Allergies Other and Sulfa antibiotics   REVIEW OF SYSTEMS  Negative except as noted here or in the History of Present Illness.   PHYSICAL EXAMINATION  Initial Vital Signs Blood pressure 128/82, pulse 75, temperature 97.7 F (36.5 C), resp. rate 18, height 5' (1.524 m), weight 60.3 kg, last menstrual period 06/27/2022, SpO2 100 %.  Examination General: Well-developed, well-nourished female in no acute distress; appearance consistent with age of record HENT: normocephalic; atraumatic Eyes: Normal appearance Neck: supple; no pain or change in paresthesias with movement of neck Heart: regular rate and rhythm Lungs: clear to auscultation bilaterally Abdomen: soft; nondistended; nontender; bowel sounds present Extremities: No  deformity; full range of motion; pulses normal; palpation of ulnar nerve at left elbow exacerbates paresthesias Neurologic: Awake, alert and oriented; motor function intact in all extremities and symmetric; no facial droop Skin: Warm and dry Psychiatric: Normal mood and affect   RESULTS  Summary of this visit's results, reviewed and interpreted by myself:   EKG Interpretation  Date/Time:  Monday July 02 2022 21:56:48 EST Ventricular Rate:  67 PR Interval:  124 QRS Duration: 64 QT Interval:  384 QTC Calculation: 405 R Axis:   79 Text Interpretation: Normal sinus rhythm Normal ECG No significant change was found Confirmed by Jeanet Lupe, Jenny Reichmann [phone]) on 07/02/2022 11:45:56 PM       Laboratory Studies: Results for orders placed or performed during the hospital encounter of 07/02/22 (from the past 24 hour(s))  Resp panel by RT-PCR (RSV, Flu A&B, Covid) Anterior Nasal Swab     Status: None   Collection Time: 07/02/22 11:43 PM   Specimen: Anterior Nasal Swab  Result Value Ref Range   SARS Coronavirus 2 by RT PCR NEGATIVE NEGATIVE   Influenza A by PCR NEGATIVE NEGATIVE   Influenza B by PCR NEGATIVE NEGATIVE   Resp Syncytial Virus by PCR NEGATIVE NEGATIVE  CBC with Differential     Status: None   Collection Time: 07/02/22 11:46 PM  Result Value Ref Range   WBC 8.2 4.0 - 10.5 K/uL   RBC 4.13 3.87 - 5.11 MIL/uL   Hemoglobin 13.1 12.0 - 15.0 g/dL   HCT 39.1 36.0 - 46.0 %   MCV 94.7 80.0 - 100.0 fL   MCH 31.7 26.0 - 34.0 pg   MCHC 33.5 30.0 - 36.0 g/dL   RDW 13.2 11.5 - 15.5 %   Platelets 193 150 - 400 K/uL   nRBC 0.0 0.0 - 0.2 %   Neutrophils Relative % 52 %   Neutro Abs 4.2 1.7 - 7.7 K/uL   Lymphocytes Relative 36 %   Lymphs Abs 2.9 0.7 - 4.0 K/uL   Monocytes Relative 7 %   Monocytes Absolute 0.6 0.1 - 1.0 K/uL   Eosinophils Relative 4 %   Eosinophils Absolute 0.4 0.0 - 0.5 K/uL   Basophils Relative 1 %   Basophils Absolute 0.1 0.0 - 0.1 K/uL   Immature Granulocytes 0 %    Abs Immature Granulocytes 0.01 0.00 - 0.07 K/uL  Basic metabolic panel     Status: Abnormal   Collection Time: 07/02/22 11:46 PM  Result Value Ref Range   Sodium 136 135 - 145 mmol/L   Potassium 3.9 3.5 - 5.1 mmol/L   Chloride 107 98 - 111 mmol/L   CO2 23 22 - 32 mmol/L   Glucose, Bld 90 70 - 99 mg/dL   BUN 15 6 - 20 mg/dL   Creatinine, Ser 0.86  0.44 - 1.00 mg/dL   Calcium 8.5 (L) 8.9 - 10.3 mg/dL   GFR, Estimated >60 >60 mL/min   Anion gap 6 5 - 15  Troponin I (High Sensitivity)     Status: None   Collection Time: 07/02/22 11:46 PM  Result Value Ref Range   Troponin I (High Sensitivity) 2 <18 ng/L   Imaging Studies: DG Chest 2 View  Result Date: 07/02/2022 CLINICAL DATA:  Shortness of breath for several days EXAM: CHEST - 2 VIEW COMPARISON:  06/28/2020 FINDINGS: Cardiac shadow is within normal limits. Lungs are well aerated bilaterally. No focal infiltrate or effusion is seen. No bony abnormality is noted. IMPRESSION: No active cardiopulmonary disease. Electronically Signed   By: Inez Catalina M.D.   On: 07/02/2022 22:10    ED COURSE and MDM  Nursing notes, initial and subsequent vitals signs, including pulse oximetry, reviewed and interpreted by myself.  Vitals:   07/02/22 2151 07/02/22 2153 07/02/22 2330  BP: 128/82  120/78  Pulse: 75  63  Resp: 18  17  Temp: 97.7 F (36.5 C)    SpO2: 100%  98%  Weight:  60.3 kg   Height:  5' (1.524 m)    Medications - No data to display  Patient advised of reassuring workup.  Her EKG is normal.  Her chest x-ray is clear.  Her lung sounds are clear.  She is negative for tested viruses.  She has follow-up appointment scheduled with cardiology.  The cause of her dyspnea is unclear.  PROCEDURES  Procedures   ED DIAGNOSES     ICD-10-CM   1. Shortness of breath  R06.02          Shanon Rosser, MD 07/03/22 [phone]

## 2022-07-03 LAB — RESP PANEL BY RT-PCR (RSV, FLU A&B, COVID)  RVPGX2
Influenza A by PCR: NEGATIVE
Influenza B by PCR: NEGATIVE
Resp Syncytial Virus by PCR: NEGATIVE
SARS Coronavirus 2 by RT PCR: NEGATIVE

## 2022-07-03 LAB — TROPONIN I (HIGH SENSITIVITY): Troponin I (High Sensitivity): 2 ng/L (ref <18)

## 2022-07-23 HISTORY — PX: ABLATION: SHX5711

## 2022-09-11 ENCOUNTER — Ambulatory Visit (HOSPITAL_BASED_OUTPATIENT_CLINIC_OR_DEPARTMENT_OTHER): Payer: BC Managed Care – PPO | Admitting: Cardiovascular Disease

## 2022-09-11 ENCOUNTER — Encounter (HOSPITAL_BASED_OUTPATIENT_CLINIC_OR_DEPARTMENT_OTHER): Payer: Self-pay | Admitting: Cardiovascular Disease

## 2022-09-11 VITALS — BP 116/82 | HR 71 | Ht 60.0 in | Wt 134.9 lb

## 2022-09-11 DIAGNOSIS — R002 Palpitations: Secondary | ICD-10-CM | POA: Diagnosis not present

## 2022-09-11 DIAGNOSIS — Z8249 Family history of ischemic heart disease and other diseases of the circulatory system: Secondary | ICD-10-CM

## 2022-09-11 DIAGNOSIS — E7800 Pure hypercholesterolemia, unspecified: Secondary | ICD-10-CM

## 2022-09-11 HISTORY — DX: Family history of ischemic heart disease and other diseases of the circulatory system: Z82.49

## 2022-09-11 HISTORY — DX: Palpitations: R00.2

## 2022-09-11 NOTE — Progress Notes (Signed)
Cardiology Office Note:    Date:  09/11/2022   ID:  [name], DOB October 30, 1970, MRN [id number]  PCP:  Jennifer Chard, MD   [facility] Providers Cardiologist:  None     Referring MD: Jennifer Chard, MD   No chief complaint on file.   History of Present Illness:    Jennifer Manning is a 52 y.o. female with a hx of migraines, post-concussive syndrome,  here for the evaluation of palpitations. She saw Jennifer Manning  On 06/30/2022 she presented to urgent care with palpitations x4 days. EKG was reassuring and vital signs were normal. Exam was unremarkable with no murmurs. She was recommended for outpatient follow-up.  Previously seen in cardiology by Jennifer Manning for palpitations in 2022. Her symptoms worsened after COVID infection in 2021. She had a treadmill stress test that was negative. In 2019 she had a monitor with occasional PACs and PVCs. Echo 09/2020 revealed LVEF 55-60% with normal diastolic function and mild MR.   Today, she states she is feeling good. Since she discovered being anemic and receiving treatment she has started to feel better. She does still notice palpitations with increased stress but they seem to be improving. Recently she also had numbness in her right arm. For the past 6 months she has been aware of increasing blood pressures. At home she normally sees readings such as 0000000 systolic. Lately her BP is closer to [id number], or [id number] systolic. Last year she had more swelling in her ankles, and had tried compression socks. If she does notice swelling, it is usually better by the next morning. In the past year she has gained about 10 lbs likely due to inactivity. It has been about 2 years since she was consistently exercising. In the past she had noticed some burning sensations with her shortness of breath that she attributed to deconditioning. Last year she had tried yoga. Lately she is not exercising due to recent health issues. She developed menorrhagia and was scheduled  for ablation. She was found to have a fibroid which was removed. Regarding her diet she is "not the best" with eating or staying hydrated. She will plan to work on her eating habits and drinking more water. She both cooks at home and orders out; tries to always have a vegetable with meals. No personal smoking history. While lying on her left side at night she develops some difficulty breathing. She denies any lightheadedness, headaches, syncope, or PND.   Past Medical History:  Diagnosis Date   Family history of early CAD 09/11/2022   Migraines    Palpitations 09/11/2022   Post concussive syndrome     Past Surgical History:  Procedure Laterality Date   TONSILLECTOMY     TUBAL LIGATION      Current Medications: Current Meds  Medication Sig   albuterol (VENTOLIN HFA) 108 (90 Base) MCG/ACT inhaler Inhale 2 puffs into the lungs every 6 (six) hours as needed for wheezing or shortness of breath.   Ascorbic Acid (VITAMIN C) 100 MG tablet Take 100 mg by mouth daily.   aspirin-acetaminophen-caffeine (EXCEDRIN MIGRAINE) 250-250-65 MG per tablet Take 1-2 tablets by mouth every 6 (six) hours as needed for headache.   cetirizine (ZYRTEC) 10 MG tablet Take 10 mg by mouth as needed.    Cholecalciferol (VITAMIN D PO) Take 1 tablet by mouth daily. 2000units   ibuprofen (ADVIL) 800 MG tablet TAKE 1 TABLET (800 MG TOTAL) BY MOUTH DAILY AS NEEDED.   magnesium oxide (MAG-OX) 400 (241.3  Mg) MG tablet Take 400 mg by mouth daily.   mometasone (NASONEX) 50 MCG/ACT nasal spray Place 2 sprays into the nose daily.   Omega-3 1000 MG CAPS Take 1,000 mg by mouth daily.   UBRELVY 50 MG TABS TAKE 1 TABLET BY MOUTH DAILY AS NEEDED.     Allergies:   Other and Sulfa antibiotics   Social History   Socioeconomic History   Marital status: Married    Spouse name: Jennifer Manning   Number of children: 3   Years of education: Not on file   Highest education level: Not on file  Occupational History   Occupation: Public relations account executive    Employer: [employer]  Tobacco Use   Smoking status: Never   Smokeless tobacco: Never  Vaping Use   Vaping Use: Never used  Substance and Sexual Activity   Alcohol use: No   Drug use: No   Sexual activity: Not on file  Other Topics Concern   Not on file  Social History Narrative   Lives with her husband and 3 children.     Right handed   Social Determinants of Health   Financial Resource Strain: Not on file  Food Insecurity: No Food Insecurity (09/11/2022)   Hunger Vital Sign    Worried About Running Out of Food in the Last Year: Never true    Ran Out of Food in the Last Year: Never true  Transportation Needs: Not on file  Physical Activity: Inactive (09/11/2022)   Exercise Vital Sign    Days of Exercise per Week: 0 days    Minutes of Exercise per Session: 0 min  Stress: Not on file  Social Connections: Not on file     Family History: The patient's family history includes Heart Problems in her mother; Heart attack in her maternal grandfather and mother; Hyperlipidemia in her brother, father, and mother; Hypertension in her brother and mother; Stroke in her maternal aunt and maternal uncle; Valvular heart disease in her paternal uncle. There is no history of Dementia.  ROS:   Please see the history of present illness.    (+) Palpitations (+) RUE numbness (+) Stress (+) Intermittent LE edema (+) Orthopnea while on left side All other systems reviewed and are negative.  EKGs/Labs/Other Studies Reviewed:    The following studies were reviewed today:  Ambulatory cardiac telemetry 7 days (04/28/21-05/05/21):  Predominant rhythm was sinus with rare PACs and PVCs.  Minimum heart rate 51 bpm, maximum heart rate 37 bpm, average heart rate 77 bpm.  Patient's symptoms correlated with sinus rhythm as well as PACs and PVCs.  No evidence of ventricular tachycardia, high degree AV block, atrial fibrillation, pauses >3 seconds, SVT.  Echocardiogram  10/05/2020:  Normal LV systolic function with visual EF 55-60%. Left ventricle cavity  is normal in size. Normal global wall motion. Normal diastolic filling  pattern, normal LAP.  Mild (Grade I) mitral regurgitation.  Mild pulmonic regurgitation.  Compared to prior study dated 08/20/2011: No significant change except Mild  MR and PR are new.   Exercise treadmill stress test 09/19/2020: Exercise treadmill stress test performed using Bruce protocol.  Patient reached 8.9 METS, and 89% of age predicted maximum heart rate.  Exercise capacity was low normal.  No chest pain reported.  Exertional dyspnea reported. Normal heart rate and hemodynamic response. Stress EKG revealed no ischemic changes. Low risk study.  EKG:   EKG is personally reviewed. 09/11/2022: EKG was not ordered.   Recent Labs: 06/30/2022: ALT 12  07/02/2022: BUN 15; Creatinine, Ser 0.86; Hemoglobin 13.1; Platelets 193; Potassium 3.9; Sodium 136   Recent Lipid Panel    Component Value Date/Time   CHOL 191 08/31/2021 1050   TRIG 42 08/31/2021 1050   HDL 67 08/31/2021 1050   CHOLHDL 2.9 08/31/2021 1050   LDLCALC 116 (H) 08/31/2021 1050     Risk Assessment/Calculations:           Physical Exam:    Wt Readings from Last 3 Encounters:  09/11/22 134 lb 14.4 oz (61.2 kg)  07/02/22 133 lb (60.3 kg)  05/15/22 130 lb 6.4 oz (59.1 kg)    VS:  BP 116/82 (BP Location: Right Arm, Patient Position: Sitting, Cuff Size: Normal)   Pulse 71   Ht 5' (1.524 m)   Wt 134 lb 14.4 oz (61.2 kg)   BMI 26.35 kg/m  , BMI Body mass index is 26.35 kg/m. GENERAL:  Well appearing HEENT: Pupils equal round and reactive, fundi not visualized, oral mucosa unremarkable NECK:  No jugular venous distention, waveform within normal limits, carotid upstroke brisk and symmetric, no bruits, no thyromegaly LUNGS:  Clear to auscultation bilaterally HEART:  RRR.  PMI not displaced or sustained,S1 and S2 within normal limits, no S3, no S4, no clicks, no  rubs, no murmurs ABD:  Flat, positive bowel sounds normal in frequency in pitch, no bruits, no rebound, no guarding, no midline pulsatile mass, no hepatomegaly, no splenomegaly EXT:  2 plus pulses throughout, no edema, no cyanosis no clubbing SKIN:  No rashes no nodules NEURO:  Cranial nerves II through XII grossly intact, motor grossly intact throughout PSYCH:  Cognitively intact, oriented to person place and time   ASSESSMENT:    1. Palpitations   2. Elevated LDL cholesterol level   3. Family history of early CAD    PLAN:    Palpitations Symptoms are better controlled now that she is no longer anemic.  No medications indicated at this time.  She had PACs and PVCs on her monitor.  Family history of early CAD She has family history of premature CAD.  Her lipids are reasonably well-controlled.  We discussed importance of increasing her exercise and continue to work on her diet by limiting fried foods, fatty foods, red meat, and cheese.  Will get a coronary calcium score and an LP(a) to better understand her risk.  No medications indicated at this time.        Disposition: FU with Yousuf Ager C. Oval Linsey, MD, [facility] in 1 year.  Medication Adjustments/Labs and Tests Ordered: Current medicines are reviewed at length with the patient today.  Concerns regarding medicines are outlined above.   Orders Placed This Encounter  Procedures   CT CARDIAC SCORING (SELF PAY ONLY)   Lipoprotein A (LPA)   No orders of the defined types were placed in this encounter.  Patient Instructions  Medication Instructions:  Your physician recommends that you continue on your current medications as directed. Please refer to the Current Medication list given to you today  *If you need a refill on your cardiac medications before your next appointment, please call your pharmacy*  Lab Work: [place]  If you have labs (blood work) drawn today and your tests are completely normal, you will receive your  results only by: [place] (if you have MyChart) OR A paper copy in the mail If you have any lab test that is abnormal or we need to change your treatment, we will call you to review the results.  Testing/Procedures: CALCIUM SCORE, THIS WILL COST $99 OUT OF POCKET   Follow-Up: At [facility], you and your health needs are our priority.  As part of our continuing mission to provide you with exceptional heart care, we have created designated Provider Care Teams.  These Care Teams include your primary Cardiologist (physician) and Advanced Practice Providers (APPs -  Physician Assistants and Nurse Practitioners) who all work together to provide you with the care you need, when you need it.  We recommend signing up for the patient portal called "MyChart".  Sign up information is provided on this After Visit Summary.  MyChart is used to connect with patients for Virtual Visits (Telemedicine).  Patients are able to view lab/test results, encounter notes, upcoming appointments, etc.  Non-urgent messages can be sent to your provider as well.   To learn more about what you can do with MyChart, go to [email or web address].    Your next appointment:   12 month(s)  Provider:   Skeet Latch, MD    [facility] Stumpf,acting as a scribe for Skeet Latch, MD.,have documented all relevant documentation on the behalf of Skeet Latch, MD,as directed by  Skeet Latch, MD while in the presence of Skeet Latch, MD.  I, [place] Oval Linsey, MD have reviewed all documentation for this visit.  The documentation of the exam, diagnosis, procedures, and orders on 09/11/2022 are all accurate and complete.   Signed, Skeet Latch, MD  09/11/2022 1:24 PM    [facility]

## 2022-09-11 NOTE — Assessment & Plan Note (Signed)
Symptoms are better controlled now that she is no longer anemic.  No medications indicated at this time.  She had PACs and PVCs on her monitor.

## 2022-09-11 NOTE — Assessment & Plan Note (Signed)
She has family history of premature CAD.  Her lipids are reasonably well-controlled.  We discussed importance of increasing her exercise and continue to work on her diet by limiting fried foods, fatty foods, red meat, and cheese.  Will get a coronary calcium score and an LP(a) to better understand her risk.  No medications indicated at this time.

## 2022-09-11 NOTE — Patient Instructions (Signed)
Medication Instructions:  Your physician recommends that you continue on your current medications as directed. Please refer to the Current Medication list given to you today  *If you need a refill on your cardiac medications before your next appointment, please call your pharmacy*  Lab Work: [place]  If you have labs (blood work) drawn today and your tests are completely normal, you will receive your results only by: [place] (if you have MyChart) OR A paper copy in the mail If you have any lab test that is abnormal or we need to change your treatment, we will call you to review the results.   Testing/Procedures: CALCIUM SCORE, THIS WILL COST $99 OUT OF POCKET   Follow-Up: At [facility], you and your health needs are our priority.  As part of our continuing mission to provide you with exceptional heart care, we have created designated Provider Care Teams.  These Care Teams include your primary Cardiologist (physician) and Advanced Practice Providers (APPs -  Physician Assistants and Nurse Practitioners) who all work together to provide you with the care you need, when you need it.  We recommend signing up for the patient portal called "MyChart".  Sign up information is provided on this After Visit Summary.  MyChart is used to connect with patients for Virtual Visits (Telemedicine).  Patients are able to view lab/test results, encounter notes, upcoming appointments, etc.  Non-urgent messages can be sent to your provider as well.   To learn more about what you can do with MyChart, go to [email or web address].    Your next appointment:   12 month(s)  Provider:   Skeet Latch, MD

## 2022-09-20 ENCOUNTER — Encounter: Payer: Self-pay | Admitting: Internal Medicine

## 2022-09-20 ENCOUNTER — Ambulatory Visit (INDEPENDENT_AMBULATORY_CARE_PROVIDER_SITE_OTHER): Payer: BC Managed Care – PPO | Admitting: Internal Medicine

## 2022-09-20 VITALS — BP 108/74 | HR 67 | Temp 98.0°F | Ht 60.0 in | Wt 130.0 lb

## 2022-09-20 DIAGNOSIS — R202 Paresthesia of skin: Secondary | ICD-10-CM

## 2022-09-20 DIAGNOSIS — M79609 Pain in unspecified limb: Secondary | ICD-10-CM | POA: Diagnosis not present

## 2022-09-20 DIAGNOSIS — Z0000 Encounter for general adult medical examination without abnormal findings: Secondary | ICD-10-CM | POA: Diagnosis not present

## 2022-09-20 DIAGNOSIS — Z23 Encounter for immunization: Secondary | ICD-10-CM

## 2022-09-20 NOTE — Progress Notes (Signed)
I,[name],acting as a scribe for Maximino Greenland, MD.,have documented all relevant documentation on the behalf of Maximino Greenland, MD,as directed by  Maximino Greenland, MD while in the presence of Maximino Greenland, MD. ]   Subjective:     Patient ID: Jennifer Manning , female    DOB: [birth year] , 52 y.o.   MRN: [id number]   Chief Complaint  Patient presents with   Annual Exam    HPI  She is here today for a full physical examination. She is followed by Dr. Cletis Media for her Gyn exams.  She reports having myomectomy on Jan 26th. She did not have any post-operative complications. She is still bleeding from the procedure.      Past Medical History:  Diagnosis Date   Family history of early CAD 09/11/2022   Migraines    Palpitations 09/11/2022   Post concussive syndrome      Family History  Problem Relation Age of Onset   Heart attack Mother    Heart Problems Mother    Hyperlipidemia Mother    Hypertension Mother    Hyperlipidemia Father    Hyperlipidemia Brother    Hypertension Brother    Stroke Maternal Aunt    Stroke Maternal Uncle    Valvular heart disease Paternal Uncle    Heart attack Maternal Grandfather    Dementia Neg Hx      Current Outpatient Medications:    albuterol (VENTOLIN HFA) 108 (90 Base) MCG/ACT inhaler, Inhale 2 puffs into the lungs every 6 (six) hours as needed for wheezing or shortness of breath., Disp: 8.5 g, Rfl: 2   Ascorbic Acid (VITAMIN C) 100 MG tablet, Take 100 mg by mouth daily., Disp: , Rfl:    aspirin-acetaminophen-caffeine (EXCEDRIN MIGRAINE) 250-250-65 MG per tablet, Take 1-2 tablets by mouth every 6 (six) hours as needed for headache., Disp: , Rfl:    cetirizine (ZYRTEC) 10 MG tablet, Take 10 mg by mouth as needed. , Disp: , Rfl:    Cholecalciferol (VITAMIN D PO), Take 1 tablet by mouth daily. 2000units, Disp: , Rfl:    ibuprofen (ADVIL) 800 MG tablet, TAKE 1 TABLET (800 MG TOTAL) BY MOUTH DAILY AS NEEDED., Disp: 30 tablet, Rfl:  0   magnesium oxide (MAG-OX) 400 (241.3 Mg) MG tablet, Take 400 mg by mouth daily., Disp: , Rfl:    mometasone (NASONEX) 50 MCG/ACT nasal spray, Place 2 sprays into the nose daily., Disp: 1 each, Rfl: 2   Omega-3 1000 MG CAPS, Take 1,000 mg by mouth daily., Disp: , Rfl:    UBRELVY 50 MG TABS, TAKE 1 TABLET BY MOUTH DAILY AS NEEDED., Disp: 12 tablet, Rfl: 1   Allergies  Allergen Reactions   Other Anaphylaxis    Shellfish    Sulfa Antibiotics Anaphylaxis      The patient states she uses tubal ligation for birth control. Last LMP was Patient's last menstrual period was 07/23/2022.. Negative for Dysmenorrhea. Negative for: breast discharge, breast lump(s), breast pain and breast self exam. Associated symptoms include abnormal vaginal bleeding. Pertinent negatives include abnormal bleeding (hematology), anxiety, decreased libido, depression, difficulty falling sleep, dyspareunia, history of infertility, nocturia, sexual dysfunction, sleep disturbances, urinary incontinence, urinary urgency, vaginal discharge and vaginal itching. Diet regular.The patient states her exercise level is  decreased due to post-op care. She plans to resume her regular exercise in the near future.   . The patient's tobacco use is:  Social History   Tobacco Use  Smoking Status Never  Smokeless Tobacco Never  . She has been exposed to passive smoke. The patient's alcohol use is:  Social History   Substance and Sexual Activity  Alcohol Use No     Review of Systems  Constitutional: Negative.   HENT: Negative.    Eyes: Negative.   Respiratory: Negative.    Cardiovascular: Negative.   Gastrointestinal: Negative.   Endocrine: Negative.   Genitourinary: Negative.   Musculoskeletal: Negative.   Skin: Negative.   Allergic/Immunologic: Negative.   Neurological:  Positive for numbness.       She has LUE tingling/numbness.  Denies fall/trauma. Went to ER in Dec 2023 for similar sx. Denies known h/o carpal tunnel. Sx  seem worse at her elbow.   Hematological: Negative.   Psychiatric/Behavioral: Negative.       Today's Vitals   09/20/22 0823  BP: 108/74  Pulse: 67  Temp: 98 F (36.7 C)  SpO2: 98%  Weight: 130 lb (59 kg)  Height: 5' (1.524 m)   Body mass index is 25.39 kg/m.  Wt Readings from Last 3 Encounters:  09/20/22 130 lb (59 kg)  09/11/22 134 lb 14.4 oz (61.2 kg)  07/02/22 133 lb (60.3 kg)    Objective:  Physical Exam Vitals and nursing note reviewed.  Constitutional:      Appearance: Normal appearance.  HENT:     Head: Normocephalic and atraumatic.     Right Ear: Tympanic membrane, ear canal and external ear normal.     Left Ear: Tympanic membrane, ear canal and external ear normal.     Nose:     Comments: Masked     Mouth/Throat:     Comments: Masked  Eyes:     Extraocular Movements: Extraocular movements intact.     Conjunctiva/sclera: Conjunctivae normal.     Pupils: Pupils are equal, round, and reactive to light.  Cardiovascular:     Rate and Rhythm: Normal rate and regular rhythm.     Pulses: Normal pulses.     Heart sounds: Normal heart sounds.  Pulmonary:     Effort: Pulmonary effort is normal.     Breath sounds: Normal breath sounds.  Chest:  Breasts:    Tanner Score is 5.     Right: Normal.     Left: Normal.  Abdominal:     General: Abdomen is flat. Bowel sounds are normal.     Palpations: Abdomen is soft.  Genitourinary:    Comments: deferred Musculoskeletal:        General: Normal range of motion.     Cervical back: Normal range of motion and neck supple.  Skin:    General: Skin is warm and dry.  Neurological:     General: No focal deficit present.     Mental Status: She is alert and oriented to person, place, and time.     Cranial Nerves: No cranial nerve deficit.     Sensory: No sensory deficit.     Motor: No weakness.     Gait: Gait normal.  Psychiatric:        Mood and Affect: Mood normal.        Behavior: Behavior normal.       Assessment And Plan:     1. Encounter for general adult medical examination w/o abnormal findings Comments: A full exam was performed. Importance of monthly SBE was discussed with the patient. I will request pap smear results. She is UTD with CRC screening.  PATIENT IS ADVISED TO GET 30-45 MINUTES REGULAR EXERCISE NO  LESS THAN FOUR TO FIVE DAYS PER WEEK - BOTH WEIGHTBEARING EXERCISES AND AEROBIC ARE RECOMMENDED.  PATIENT IS ADVISED TO FOLLOW A HEALTHY DIET WITH AT LEAST SIX FRUITS/VEGGIES PER DAY, DECREASE INTAKE OF RED MEAT, AND TO INCREASE FISH INTAKE TO TWO DAYS PER WEEK.  MEATS/FISH SHOULD NOT BE FRIED, BAKED OR BROILED IS PREFERABLE.  IT IS ALSO IMPORTANT TO CUT BACK ON YOUR SUGAR INTAKE. PLEASE AVOID ANYTHING WITH ADDED SUGAR, CORN SYRUP OR OTHER SWEETENERS. IF YOU MUST USE A SWEETENER, YOU CAN TRY STEVIA. IT IS ALSO IMPORTANT TO AVOID ARTIFICIALLY SWEETENERS AND DIET BEVERAGES. LASTLY, I SUGGEST WEARING SPF 50 SUNSCREEN ON EXPOSED PARTS AND ESPECIALLY WHEN IN THE DIRECT SUNLIGHT FOR AN EXTENDED PERIOD OF TIME.  PLEASE AVOID FAST FOOD RESTAURANTS AND INCREASE YOUR WATER INTAKE. - CMP14+EGFR - CBC - Lipid panel - TSH - Vitamin D (25 hydroxy)  2. Paresthesia and pain of left extremity Comments: I will refer her for nerve conduction study. Sx suggestive of ulnar neuropathy. She is in agreement of treatment plan. - Ambulatory referral to Neurology  3. Need for Tdap vaccination - Tdap vaccine greater than or equal to 7yo IM  Patient was given opportunity to ask questions. Patient verbalized understanding of the plan and was able to repeat key elements of the plan. All questions were answered to their satisfaction.   I, Maximino Greenland, MD, have reviewed all documentation for this visit. The documentation on 09/20/22 for the exam, diagnosis, procedures, and orders are all accurate and complete.   THE PATIENT IS ENCOURAGED TO PRACTICE SOCIAL DISTANCING DUE TO THE COVID-19 PANDEMIC.

## 2022-09-20 NOTE — Patient Instructions (Signed)

## 2022-09-21 LAB — CBC
Hematocrit: 42.6 % (ref 34.0–46.6)
Hemoglobin: 14 g/dL (ref 11.1–15.9)
MCH: 31.3 pg (ref 26.6–33.0)
MCHC: 32.9 g/dL (ref 31.5–35.7)
MCV: 95 fL (ref 79–97)
Platelets: 301 10*3/uL (ref 150–450)
RBC: 4.48 x10E6/uL (ref 3.77–5.28)
RDW: 12 % (ref 11.7–15.4)
WBC: 4.6 10*3/uL (ref 3.4–10.8)

## 2022-09-21 LAB — CMP14+EGFR
ALT: 13 IU/L (ref 0–32)
AST: 12 IU/L (ref 0–40)
Albumin/Globulin Ratio: 1.7 (ref 1.2–2.2)
Albumin: 4.3 g/dL (ref 3.8–4.9)
Alkaline Phosphatase: 59 IU/L (ref 44–121)
BUN/Creatinine Ratio: 18 (ref 9–23)
BUN: 14 mg/dL (ref 6–24)
Bilirubin Total: 0.5 mg/dL (ref 0.0–1.2)
CO2: 24 mmol/L (ref 20–29)
Calcium: 9.3 mg/dL (ref 8.7–10.2)
Chloride: 104 mmol/L (ref 96–106)
Creatinine, Ser: 0.77 mg/dL (ref 0.57–1.00)
Globulin, Total: 2.6 g/dL (ref 1.5–4.5)
Glucose: 81 mg/dL (ref 70–99)
Potassium: 4.5 mmol/L (ref 3.5–5.2)
Sodium: 141 mmol/L (ref 134–144)
Total Protein: 6.9 g/dL (ref 6.0–8.5)
eGFR: 92 mL/min/{1.73_m2} (ref >59)

## 2022-09-21 LAB — LIPID PANEL
Chol/HDL Ratio: 3.1 ratio (ref 0.0–4.4)
Cholesterol, Total: 221 mg/dL — ABNORMAL HIGH (ref 100–199)
HDL: 71 mg/dL (ref >39)
LDL Chol Calc (NIH): 142 mg/dL — ABNORMAL HIGH (ref 0–99)
Triglycerides: 49 mg/dL (ref 0–149)
VLDL Cholesterol Cal: 8 mg/dL (ref 5–40)

## 2022-09-21 LAB — TSH: TSH: 2.29 u[IU]/mL (ref 0.450–4.500)

## 2022-09-21 LAB — VITAMIN D 25 HYDROXY (VIT D DEFICIENCY, FRACTURES): Vit D, 25-Hydroxy: 20.8 ng/mL — ABNORMAL LOW (ref 30.0–100.0)

## 2022-09-25 ENCOUNTER — Encounter: Payer: Self-pay | Admitting: Internal Medicine

## 2022-10-11 ENCOUNTER — Ambulatory Visit (HOSPITAL_BASED_OUTPATIENT_CLINIC_OR_DEPARTMENT_OTHER)
Admission: RE | Admit: 2022-10-11 | Discharge: 2022-10-11 | Disposition: A | Discharge status: Home / Self Care | Payer: BC Managed Care – PPO | Source: Ambulatory Visit | Attending: Cardiovascular Disease | Admitting: Cardiovascular Disease

## 2022-10-11 DIAGNOSIS — R002 Palpitations: Secondary | ICD-10-CM | POA: Insufficient documentation

## 2022-10-29 ENCOUNTER — Telehealth (HOSPITAL_BASED_OUTPATIENT_CLINIC_OR_DEPARTMENT_OTHER): Payer: Self-pay

## 2022-10-29 DIAGNOSIS — E7800 Pure hypercholesterolemia, unspecified: Secondary | ICD-10-CM

## 2022-10-29 MED ORDER — ROSUVASTATIN CALCIUM 10 MG PO TABS: 10.0000 mg | ORAL_TABLET | Freq: Every day | ORAL | 3 refills | Status: DC

## 2022-10-29 NOTE — Telephone Encounter (Addendum)
Seen by patient Butch Penny on 10/28/2022  9:06 PM; orders placed and labs mailed to patient, follow up mychart message sent to patient.    ----- Message from Alver Sorrow, NP sent at 10/28/2022  6:13 PM EDT ----- Coronary calcium score 0.916. Mild amount of plaque in heart arteries not enough to cause symptoms. Recommend aggressive secondary prevention: exercise 150 minutes per week, follow heart healthy diet, start Rosuvastatin 10mg  daily with FLP/LFT/Lp(a) in 2 months.

## 2022-11-28 ENCOUNTER — Telehealth: Payer: Self-pay

## 2022-11-28 NOTE — Telephone Encounter (Signed)
PA for Jennifer Manning has been submitted through cover my meds. We are waiting on the determination. YL,RMA

## 2022-12-18 ENCOUNTER — Other Ambulatory Visit: Payer: Self-pay | Admitting: Internal Medicine

## 2022-12-18 DIAGNOSIS — R059 Cough, unspecified: Secondary | ICD-10-CM

## 2022-12-18 DIAGNOSIS — R0602 Shortness of breath: Secondary | ICD-10-CM

## 2022-12-18 DIAGNOSIS — J4520 Mild intermittent asthma, uncomplicated: Secondary | ICD-10-CM

## 2023-01-15 LAB — HM MAMMOGRAPHY

## 2023-02-19 ENCOUNTER — Other Ambulatory Visit: Payer: Self-pay | Admitting: Internal Medicine

## 2023-03-21 ENCOUNTER — Ambulatory Visit: Payer: BC Managed Care – PPO | Admitting: Family Medicine

## 2023-03-21 ENCOUNTER — Encounter: Payer: Self-pay | Admitting: Family Medicine

## 2023-03-21 VITALS — BP 118/80 | HR 68 | Temp 98.5°F | Ht 60.0 in | Wt 134.0 lb

## 2023-03-21 DIAGNOSIS — M5416 Radiculopathy, lumbar region: Secondary | ICD-10-CM

## 2023-03-21 DIAGNOSIS — Y92009 Unspecified place in unspecified non-institutional (private) residence as the place of occurrence of the external cause: Secondary | ICD-10-CM

## 2023-03-21 DIAGNOSIS — W19XXXS Unspecified fall, sequela: Secondary | ICD-10-CM

## 2023-03-21 DIAGNOSIS — Z2821 Immunization not carried out because of patient refusal: Secondary | ICD-10-CM

## 2023-03-21 DIAGNOSIS — W19XXXA Unspecified fall, initial encounter: Secondary | ICD-10-CM | POA: Insufficient documentation

## 2023-03-21 MED ORDER — METHOCARBAMOL 750 MG PO TABS: 750.0000 mg | ORAL_TABLET | Freq: Three times a day (TID) | ORAL | 0 refills | Status: AC | PRN

## 2023-03-21 NOTE — Progress Notes (Signed)
I,Jameka J Llittleton, CMA,acting as a Neurosurgeon for [employer], NP.,have documented all relevant documentation on the behalf of Ellender Hose, NP,as directed by  Ellender Hose, NP while in the presence of Ellender Hose, NP.  Subjective:  Patient ID: Jennifer Manning , female    DOB: 1971-05-09 , 52 y.o.   MRN: [id number]  Chief Complaint  Patient presents with   Back Pain    HPI  Patient presents today for back pain. She reports that the  pain is in her lower back, buttocks radiating to her left leg then the right side of her neck, radiating to her fingers on the right hand. Patient states she fell  at home on 03/15/2023 , hitting her buttocks , she heard  something popped. She reports having a tightness in her back with some cramping. She states she was in an accident about 3-4 years ago, this has exacerbated it she states.     Past Medical History:  Diagnosis Date   Family history of early CAD 09/11/2022   Migraines    Palpitations 09/11/2022   Post concussive syndrome      Family History  Problem Relation Age of Onset   Heart attack Mother    Heart Problems Mother    Hyperlipidemia Mother    Hypertension Mother    Hyperlipidemia Father    Hyperlipidemia Brother    Hypertension Brother    Stroke Maternal Aunt    Stroke Maternal Uncle    Valvular heart disease Paternal Uncle    Heart attack Maternal Grandfather    Dementia Neg Hx      Current Outpatient Medications:    albuterol (VENTOLIN HFA) 108 (90 Base) MCG/ACT inhaler, TAKE 2 PUFFS BY MOUTH EVERY 6 HOURS AS NEEDED FOR WHEEZE OR SHORTNESS OF BREATH, Disp: 8.5 each, Rfl: 2   Ascorbic Acid (VITAMIN C) 100 MG tablet, Take 100 mg by mouth daily., Disp: , Rfl:    aspirin-acetaminophen-caffeine (EXCEDRIN MIGRAINE) 250-250-65 MG per tablet, Take 1-2 tablets by mouth every 6 (six) hours as needed for headache., Disp: , Rfl:    cetirizine (ZYRTEC) 10 MG tablet, Take 10 mg by mouth as needed. , Disp: , Rfl:    Cholecalciferol (VITAMIN D  PO), Take 1 tablet by mouth daily. 2000units, Disp: , Rfl:    ibuprofen (ADVIL) 800 MG tablet, TAKE 1 TABLET (800 MG TOTAL) BY MOUTH DAILY AS NEEDED., Disp: 30 tablet, Rfl: 0   magnesium oxide (MAG-OX) 400 (241.3 Mg) MG tablet, Take 400 mg by mouth daily., Disp: , Rfl:    methocarbamol (ROBAXIN-750) 750 MG tablet, Take 1 tablet (750 mg total) by mouth every 8 (eight) hours as needed for up to 15 days for muscle spasms., Disp: 45 tablet, Rfl: 0   mometasone (NASONEX) 50 MCG/ACT nasal spray, Place 2 sprays into the nose daily., Disp: 1 each, Rfl: 2   Omega-3 1000 MG CAPS, Take 1,000 mg by mouth daily., Disp: , Rfl:    rosuvastatin (CRESTOR) 10 MG tablet, Take 1 tablet (10 mg total) by mouth daily., Disp: 90 tablet, Rfl: 3   UBRELVY 50 MG TABS, TAKE 1 TABLET BY MOUTH DAILY AS NEEDED., Disp: 12 tablet, Rfl: 1   Allergies  Allergen Reactions   Other Anaphylaxis    Shellfish    Sulfa Antibiotics Anaphylaxis     Review of Systems  Constitutional: Negative.   HENT: Negative.    Respiratory: Negative.    Cardiovascular: Negative.   Musculoskeletal:  Positive for back pain and  neck pain.  Psychiatric/Behavioral: Negative.       Today's Vitals   03/21/23 0839  BP: 118/80  Pulse: 68  Temp: 98.5 F (36.9 C)  Weight: 134 lb (60.8 kg)  Height: 5' (1.524 m)  PainSc: 4   PainLoc: Back   Body mass index is 26.17 kg/m.  Wt Readings from Last 3 Encounters:  03/21/23 134 lb (60.8 kg)  09/20/22 130 lb (59 kg)  09/11/22 134 lb 14.4 oz (61.2 kg)      Objective:  Physical Exam Musculoskeletal:        General: Tenderness present.     Cervical back: Tenderness present. Pain with movement and muscular tenderness present.     Lumbar back: Spasms and tenderness present.  Neurological:     Mental Status: She is alert and oriented to person, place, and time.         Assessment And Plan:  Lumbar back pain with radiculopathy affecting left lower extremity Assessment & Plan: Use muscle  relaxants as directed. Referred to ortho  Orders: -     Methocarbamol; Take 1 tablet (750 mg total) by mouth every 8 (eight) hours as needed for up to 15 days for muscle spasms.  Dispense: 45 tablet; Refill: 0 -     Ambulatory referral to Orthopedic Surgery  Fall in home, sequela Assessment & Plan: Referred for physical therapy  Orders: -     Ambulatory referral to Physical Therapy  Influenza vaccination declined  Herpes zoster vaccination declined    Return if symptoms worsen or fail to improve, for Keep Scheduled Appt.  Patient was given opportunity to ask questions. Patient verbalized understanding of the plan and was able to repeat key elements of the plan. All questions were answered to their satisfaction.    I, Ellender Hose, NP, have reviewed all documentation for this visit. The documentation on 03/21/23 for the exam, diagnosis, procedures, and orders are all accurate and complete.   IF YOU HAVE BEEN REFERRED TO A SPECIALIST, IT MAY TAKE 1-2 WEEKS TO SCHEDULE/PROCESS THE REFERRAL. IF YOU HAVE NOT HEARD FROM US/SPECIALIST IN TWO WEEKS, PLEASE GIVE [place] A CALL AT [phone].

## 2023-03-21 NOTE — Assessment & Plan Note (Signed)
Use muscle relaxants as directed. Referred to ortho

## 2023-03-21 NOTE — Assessment & Plan Note (Signed)
Referred for physical therapy

## 2023-03-25 ENCOUNTER — Other Ambulatory Visit: Payer: Self-pay | Admitting: Internal Medicine

## 2023-04-18 ENCOUNTER — Ambulatory Visit: Payer: BC Managed Care – PPO | Admitting: Family Medicine

## 2023-04-18 ENCOUNTER — Encounter: Payer: Self-pay | Admitting: Family Medicine

## 2023-04-18 VITALS — BP 110/70 | HR 67 | Temp 98.1°F | Ht 60.0 in | Wt 139.0 lb

## 2023-04-18 DIAGNOSIS — R635 Abnormal weight gain: Secondary | ICD-10-CM

## 2023-04-18 DIAGNOSIS — E538 Deficiency of other specified B group vitamins: Secondary | ICD-10-CM

## 2023-04-18 DIAGNOSIS — E559 Vitamin D deficiency, unspecified: Secondary | ICD-10-CM | POA: Diagnosis not present

## 2023-04-18 DIAGNOSIS — R5383 Other fatigue: Secondary | ICD-10-CM

## 2023-04-19 LAB — BMP8+EGFR
BUN/Creatinine Ratio: 11 (ref 9–23)
BUN: 8 mg/dL (ref 6–24)
CO2: 23 mmol/L (ref 20–29)
Calcium: 9.2 mg/dL (ref 8.7–10.2)
Chloride: 104 mmol/L (ref 96–106)
Creatinine, Ser: 0.74 mg/dL (ref 0.57–1.00)
Glucose: 76 mg/dL (ref 70–99)
Potassium: 4.5 mmol/L (ref 3.5–5.2)
Sodium: 140 mmol/L (ref 134–144)
eGFR: 96 mL/min/{1.73_m2} (ref >59)

## 2023-04-19 LAB — TSH+FREE T4
Free T4: 1.18 ng/dL (ref 0.82–1.77)
TSH: 1.92 u[IU]/mL (ref 0.450–4.500)

## 2023-04-19 LAB — CBC WITH DIFFERENTIAL/PLATELET
Basophils Absolute: 0 10*3/uL (ref 0.0–0.2)
Basos: 1 %
EOS (ABSOLUTE): 0.1 10*3/uL (ref 0.0–0.4)
Eos: 2 %
Hematocrit: 41.3 % (ref 34.0–46.6)
Hemoglobin: 13.1 g/dL (ref 11.1–15.9)
Immature Grans (Abs): 0 10*3/uL (ref 0.0–0.1)
Immature Granulocytes: 0 %
Lymphocytes Absolute: 1.9 10*3/uL (ref 0.7–3.1)
Lymphs: 30 %
MCH: 30.9 pg (ref 26.6–33.0)
MCHC: 31.7 g/dL (ref 31.5–35.7)
MCV: 97 fL (ref 79–97)
Monocytes Absolute: 0.5 10*3/uL (ref 0.1–0.9)
Monocytes: 8 %
Neutrophils Absolute: 3.7 10*3/uL (ref 1.4–7.0)
Neutrophils: 59 %
Platelets: 329 10*3/uL (ref 150–450)
RBC: 4.24 x10E6/uL (ref 3.77–5.28)
RDW: 12.9 % (ref 11.7–15.4)
WBC: 6.2 10*3/uL (ref 3.4–10.8)

## 2023-04-19 LAB — VITAMIN B12: Vitamin B-12: 734 pg/mL (ref 232–1245)

## 2023-04-19 LAB — VITAMIN D 25 HYDROXY (VIT D DEFICIENCY, FRACTURES): Vit D, 25-Hydroxy: 25.1 ng/mL — ABNORMAL LOW (ref 30.0–100.0)

## 2023-04-23 HISTORY — PX: OTHER SURGICAL HISTORY: SHX169

## 2023-04-25 ENCOUNTER — Other Ambulatory Visit: Payer: Self-pay | Admitting: Internal Medicine

## 2023-04-25 DIAGNOSIS — R0602 Shortness of breath: Secondary | ICD-10-CM

## 2023-04-25 DIAGNOSIS — J4520 Mild intermittent asthma, uncomplicated: Secondary | ICD-10-CM

## 2023-04-25 DIAGNOSIS — R059 Cough, unspecified: Secondary | ICD-10-CM

## 2023-04-30 ENCOUNTER — Other Ambulatory Visit: Payer: Self-pay | Admitting: Internal Medicine

## 2023-05-07 ENCOUNTER — Emergency Department (HOSPITAL_BASED_OUTPATIENT_CLINIC_OR_DEPARTMENT_OTHER)
Admission: EM | Admit: 2023-05-07 | Discharge: 2023-05-08 | Disposition: A | Discharge status: Home / Self Care | Payer: BC Managed Care – PPO | Attending: Emergency Medicine | Admitting: Emergency Medicine

## 2023-05-07 ENCOUNTER — Encounter (HOSPITAL_BASED_OUTPATIENT_CLINIC_OR_DEPARTMENT_OTHER): Payer: Self-pay | Admitting: Urology

## 2023-05-07 ENCOUNTER — Emergency Department (HOSPITAL_BASED_OUTPATIENT_CLINIC_OR_DEPARTMENT_OTHER): Payer: BC Managed Care – PPO

## 2023-05-07 DIAGNOSIS — D72829 Elevated white blood cell count, unspecified: Secondary | ICD-10-CM | POA: Diagnosis not present

## 2023-05-07 DIAGNOSIS — N858 Other specified noninflammatory disorders of uterus: Secondary | ICD-10-CM | POA: Insufficient documentation

## 2023-05-07 DIAGNOSIS — D259 Leiomyoma of uterus, unspecified: Secondary | ICD-10-CM | POA: Diagnosis not present

## 2023-05-07 DIAGNOSIS — R1030 Lower abdominal pain, unspecified: Secondary | ICD-10-CM | POA: Diagnosis present

## 2023-05-07 DIAGNOSIS — N859 Noninflammatory disorder of uterus, unspecified: Secondary | ICD-10-CM

## 2023-05-07 LAB — URINALYSIS, ROUTINE W REFLEX MICROSCOPIC
Bilirubin Urine: NEGATIVE
Glucose, UA: NEGATIVE mg/dL
Ketones, ur: 15 mg/dL — AB
Leukocytes,Ua: NEGATIVE
Nitrite: NEGATIVE
Protein, ur: >=300 mg/dL — AB
Specific Gravity, Urine: 1.025 (ref 1.005–1.030)
pH: 7.5 (ref 5.0–8.0)

## 2023-05-07 LAB — LIPASE, BLOOD: Lipase: 34 U/L (ref 11–51)

## 2023-05-07 LAB — CBC WITH DIFFERENTIAL/PLATELET
Abs Immature Granulocytes: 0.59 10*3/uL — ABNORMAL HIGH (ref 0.00–0.07)
Basophils Absolute: 0 10*3/uL (ref 0.0–0.1)
Basophils Relative: 0 %
Eosinophils Absolute: 0 10*3/uL (ref 0.0–0.5)
Eosinophils Relative: 0 %
HCT: 38.5 % (ref 36.0–46.0)
Hemoglobin: 13 g/dL (ref 12.0–15.0)
Immature Granulocytes: 3 %
Lymphocytes Relative: 3 %
Lymphs Abs: 0.7 10*3/uL (ref 0.7–4.0)
MCH: 31.2 pg (ref 26.0–34.0)
MCHC: 33.8 g/dL (ref 30.0–36.0)
MCV: 92.3 fL (ref 80.0–100.0)
Monocytes Absolute: 1.2 10*3/uL — ABNORMAL HIGH (ref 0.1–1.0)
Monocytes Relative: 5 %
Neutro Abs: 21.3 10*3/uL — ABNORMAL HIGH (ref 1.7–7.7)
Neutrophils Relative %: 89 %
Platelets: 232 10*3/uL (ref 150–400)
RBC: 4.17 MIL/uL (ref 3.87–5.11)
RDW: 13.1 % (ref 11.5–15.5)
WBC: 24.2 10*3/uL — ABNORMAL HIGH (ref 4.0–10.5)
nRBC: 0 % (ref 0.0–0.2)

## 2023-05-07 LAB — URINALYSIS, MICROSCOPIC (REFLEX)

## 2023-05-07 LAB — COMPREHENSIVE METABOLIC PANEL
ALT: 16 U/L (ref 0–44)
AST: 26 U/L (ref 15–41)
Albumin: 3.8 g/dL (ref 3.5–5.0)
Alkaline Phosphatase: 62 U/L (ref 38–126)
Anion gap: 13 (ref 5–15)
BUN: 10 mg/dL (ref 6–20)
CO2: 23 mmol/L (ref 22–32)
Calcium: 8.8 mg/dL — ABNORMAL LOW (ref 8.9–10.3)
Chloride: 101 mmol/L (ref 98–111)
Creatinine, Ser: 0.95 mg/dL (ref 0.44–1.00)
GFR, Estimated: >60 mL/min (ref >60)
Glucose, Bld: 115 mg/dL — ABNORMAL HIGH (ref 70–99)
Potassium: 3.7 mmol/L (ref 3.5–5.1)
Sodium: 137 mmol/L (ref 135–145)
Total Bilirubin: 2.2 mg/dL — ABNORMAL HIGH (ref 0.3–1.2)
Total Protein: 7.4 g/dL (ref 6.5–8.1)

## 2023-05-07 LAB — CBC
HCT: 34.5 % — ABNORMAL LOW (ref 36.0–46.0)
HCT: 32.5 % — ABNORMAL LOW (ref 36.0–46.0)
Hemoglobin: 11.6 g/dL — ABNORMAL LOW (ref 12.0–15.0)
Hemoglobin: 11 g/dL — ABNORMAL LOW (ref 12.0–15.0)
MCH: 31 pg (ref 26.0–34.0)
MCH: 31.3 pg (ref 26.0–34.0)
MCHC: 33.6 g/dL (ref 30.0–36.0)
MCHC: 33.8 g/dL (ref 30.0–36.0)
MCV: 92.2 fL (ref 80.0–100.0)
MCV: 92.6 fL (ref 80.0–100.0)
Platelets: 226 10*3/uL (ref 150–400)
Platelets: 202 10*3/uL (ref 150–400)
RBC: 3.74 MIL/uL — ABNORMAL LOW (ref 3.87–5.11)
RBC: 3.51 MIL/uL — ABNORMAL LOW (ref 3.87–5.11)
RDW: 13.4 % (ref 11.5–15.5)
RDW: 13.3 % (ref 11.5–15.5)
WBC: 22.1 10*3/uL — ABNORMAL HIGH (ref 4.0–10.5)
WBC: 18.8 10*3/uL — ABNORMAL HIGH (ref 4.0–10.5)
nRBC: 0 % (ref 0.0–0.2)
nRBC: 0 % (ref 0.0–0.2)

## 2023-05-07 LAB — LACTIC ACID, PLASMA: Lactic Acid, Venous: 0.9 mmol/L (ref 0.5–1.9)

## 2023-05-07 MED ORDER — MORPHINE SULFATE (PF) 2 MG/ML IV SOLN
2.0000 mg | Freq: Once | INTRAVENOUS | Status: AC
  Administered 2023-05-07: 2 mg via INTRAVENOUS
  Filled 2023-05-07: qty 1

## 2023-05-07 MED ORDER — FENTANYL CITRATE PF 50 MCG/ML IJ SOSY
50.0000 ug | PREFILLED_SYRINGE | Freq: Once | INTRAMUSCULAR | Status: AC
  Administered 2023-05-07: 50 ug via INTRAVENOUS
  Filled 2023-05-07: qty 1

## 2023-05-07 MED ORDER — MORPHINE SULFATE (PF) 4 MG/ML IV SOLN
4.0000 mg | Freq: Once | INTRAVENOUS | Status: AC
  Administered 2023-05-07: 4 mg via INTRAVENOUS
  Filled 2023-05-07: qty 1

## 2023-05-07 MED ORDER — FENTANYL CITRATE PF 50 MCG/ML IJ SOSY
25.0000 ug | PREFILLED_SYRINGE | Freq: Once | INTRAMUSCULAR | Status: AC
  Administered 2023-05-07: 25 ug via INTRAVENOUS
  Filled 2023-05-07: qty 1

## 2023-05-07 MED ORDER — SODIUM CHLORIDE 0.9 % IV BOLUS
1000.0000 mL | Freq: Once | INTRAVENOUS | Status: AC
  Administered 2023-05-07: 1000 mL via INTRAVENOUS

## 2023-05-07 MED ORDER — KETOROLAC TROMETHAMINE 15 MG/ML IJ SOLN: 15.0000 mg | Freq: Once | INTRAMUSCULAR | Status: DC

## 2023-05-07 MED ORDER — MORPHINE SULFATE (PF) 2 MG/ML IV SOLN
1.0000 mg | Freq: Once | INTRAVENOUS | Status: AC
  Administered 2023-05-07: 1 mg via INTRAVENOUS
  Filled 2023-05-07: qty 1

## 2023-05-07 MED ORDER — OXYCODONE-ACETAMINOPHEN 5-325 MG PO TABS
2.0000 | ORAL_TABLET | Freq: Once | ORAL | Status: AC
  Administered 2023-05-07: 2 via ORAL
  Filled 2023-05-07: qty 2

## 2023-05-07 MED ORDER — ONDANSETRON HCL 4 MG/2ML IJ SOLN
4.0000 mg | Freq: Once | INTRAMUSCULAR | Status: AC
  Administered 2023-05-07: 4 mg via INTRAVENOUS
  Filled 2023-05-07: qty 2

## 2023-05-07 MED ORDER — HYDROCODONE-ACETAMINOPHEN 5-325 MG PO TABS: 1.0000 | ORAL_TABLET | Freq: Four times a day (QID) | ORAL | 0 refills | Status: AC | PRN

## 2023-05-07 MED ORDER — IOHEXOL 300 MG/ML  SOLN
100.0000 mL | Freq: Once | INTRAMUSCULAR | Status: AC | PRN
  Administered 2023-05-07: 100 mL via INTRAVENOUS

## 2023-05-07 MED ORDER — KETOROLAC TROMETHAMINE 30 MG/ML IJ SOLN
30.0000 mg | Freq: Once | INTRAMUSCULAR | Status: AC
  Administered 2023-05-07: 30 mg via INTRAVENOUS
  Filled 2023-05-07: qty 1

## 2023-05-07 NOTE — Discharge Instructions (Addendum)
You were seen in the ER today for lower abdominal/pelvic pain. Your imaging was concerning for fluid accumulation in the endometrium. I spoke with Dr. Estanislado Pandy regarding your care and she advised that she can see you in the office tomorrow morning to have this fluid drained. In the meantime, medications for pain were sent to your pharmacy.  Today your blood pressure was a little low as well however this is most likely a side effect from all the pain meds.  I have prescribed for you 3 days worth of Norco to take every 6 hours as needed for pain.  This medication may drop her blood pressure and make you lightheaded or dizzy so please do not operate machinery or drive after taking this medicine.  If symptoms are worsening, please return to the ER.

## 2023-05-07 NOTE — ED Provider Notes (Signed)
[place] [facility] Provider Note   CSN: [id number] Arrival date & time: 05/07/23  1135     History Chief Complaint  Patient presents with   Pelvic Pain    Jennifer Manning is a 52 y.o. female.  Patient with past history significant for radiculopathy, chronic migraines, vestibular disequilibrium presents with concerns of lower abdominal tenderness/pelvic pain.  States has been ongoing for about 2 days and worsening.  Endorsing chills but denies any subjective fevers at home.  Nausea that is progressively worsened with the episode of vomiting today.  Currently denies any vaginal bleeding, discharge, or urinary symptoms.  Endorsing some pain in the rectum.  Dr. Estanislado Pandy is patient's current gynecologist.   Pelvic Pain Associated symptoms include abdominal pain.       Home Medications Prior to Admission medications   Medication Sig Start Date End Date Taking? Authorizing Provider  albuterol (VENTOLIN HFA) 108 (90 Base) MCG/ACT inhaler TAKE 2 PUFFS BY MOUTH EVERY 6 HOURS AS NEEDED FOR WHEEZE OR SHORTNESS OF BREATH 04/25/23   Dorothyann Peng, MD  Ascorbic Acid (VITAMIN C) 100 MG tablet Take 100 mg by mouth daily.    [provider]  aspirin-acetaminophen-caffeine (EXCEDRIN MIGRAINE) [phone] MG per tablet Take 1-2 tablets by mouth every 6 (six) hours as needed for headache.    [provider]  cetirizine (ZYRTEC) 10 MG tablet Take 10 mg by mouth as needed.     [provider]  Cholecalciferol (VITAMIN D PO) Take 1 tablet by mouth daily. 2000units    [provider]  ibuprofen (ADVIL) 800 MG tablet TAKE 1 TABLET (800 MG TOTAL) BY MOUTH DAILY AS NEEDED. 04/30/23   Dorothyann Peng, MD  magnesium oxide (MAG-OX) 400 (241.3 Mg) MG tablet Take 400 mg by mouth daily.    [provider]  mometasone (NASONEX) 50 MCG/ACT nasal spray Place 2 sprays into the nose daily. Patient not taking: Reported on 04/18/2023 05/15/22  05/15/23  Arnette Felts, FNP  Omega-3 1000 MG CAPS Take 1,000 mg by mouth daily.    [provider]  rosuvastatin (CRESTOR) 10 MG tablet Take 1 tablet (10 mg total) by mouth daily. Patient not taking: Reported on 04/18/2023 10/29/22 10/24/23  Alver Sorrow, NP  UBRELVY 50 MG TABS TAKE 1 TABLET BY MOUTH DAILY AS NEEDED. 11/30/20   Dorothyann Peng, MD      Allergies    Other and Sulfa antibiotics    Review of Systems   Review of Systems  Gastrointestinal:  Positive for abdominal pain.  Genitourinary:  Positive for pelvic pain.  All other systems reviewed and are negative.   Physical Exam Updated Vital Signs BP 98/64   Pulse 66   Temp 99.2 F (37.3 C) (Oral)   Resp 16   Ht 5' (1.524 m)   Wt 63 kg   SpO2 96%   BMI 27.13 kg/m  Physical Exam Vitals and nursing note reviewed. Exam conducted with a chaperone present.  Constitutional:      General: She is not in acute distress.    Appearance: She is well-developed.  HENT:     Head: Normocephalic and atraumatic.  Eyes:     Conjunctiva/sclera: Conjunctivae normal.  Cardiovascular:     Rate and Rhythm: Normal rate and regular rhythm.     Heart sounds: No murmur heard. Pulmonary:     Effort: Pulmonary effort is normal. No respiratory distress.     Breath sounds: Normal breath sounds.  Abdominal:  General: Bowel sounds are normal.     Palpations: Abdomen is soft. There is no mass.     Tenderness: There is abdominal tenderness in the suprapubic area. There is no right CVA tenderness, left CVA tenderness, guarding or rebound.  Genitourinary:    General: Normal vulva.     Vagina: Normal. No vaginal discharge, erythema, tenderness or bleeding.     Cervix: Cervical motion tenderness present. No discharge, friability, erythema or cervical bleeding.     Adnexa:        Right: Tenderness present. No mass.         Left: No mass or tenderness.    Musculoskeletal:        General: No swelling.     Cervical back: Neck supple.   Skin:    General: Skin is warm and dry.     Capillary Refill: Capillary refill takes less than 2 seconds.  Neurological:     Mental Status: She is alert.  Psychiatric:        Mood and Affect: Mood normal.     ED Results / Procedures / Treatments   Labs (all labs ordered are listed, but only abnormal results are displayed) Labs Reviewed  CBC WITH DIFFERENTIAL/PLATELET - Abnormal; Notable for the following components:      Result Value   WBC 24.2 (*)    Neutro Abs 21.3 (*)    Monocytes Absolute 1.2 (*)    Abs Immature Granulocytes 0.59 (*)    All other components within normal limits  COMPREHENSIVE METABOLIC PANEL - Abnormal; Notable for the following components:   Glucose, Bld 115 (*)    Calcium 8.8 (*)    Total Bilirubin 2.2 (*)    All other components within normal limits  URINALYSIS, ROUTINE W REFLEX MICROSCOPIC - Abnormal; Notable for the following components:   Hgb urine dipstick LARGE (*)    Ketones, ur 15 (*)    Protein, ur >=300 (*)    All other components within normal limits  URINALYSIS, MICROSCOPIC (REFLEX) - Abnormal; Notable for the following components:   Bacteria, UA FEW (*)    All other components within normal limits  CBC - Abnormal; Notable for the following components:   WBC 22.1 (*)    RBC 3.74 (*)    Hemoglobin 11.6 (*)    HCT 34.5 (*)    All other components within normal limits  CULTURE, BLOOD (ROUTINE X 2)  CULTURE, BLOOD (ROUTINE X 2)  LIPASE, BLOOD  LACTIC ACID, PLASMA  LACTIC ACID, PLASMA  HIV ANTIBODY (ROUTINE TESTING W REFLEX)  GC/CHLAMYDIA PROBE AMP ([place]) NOT AT [facility]    EKG None  Radiology US PELVIC COMPLETE W TRANSVAGINAL AND TORSION R/O  Result Date: 05/07/2023 CLINICAL DATA:  Pelvic pain. EXAM: TRANSABDOMINAL AND TRANSVAGINAL ULTRASOUND OF PELVIS DOPPLER ULTRASOUND OF OVARIES TECHNIQUE: Both transabdominal and transvaginal ultrasound examinations of the pelvis were performed. Transabdominal technique was performed for  global imaging of the pelvis including uterus, ovaries, adnexal regions, and pelvic cul-de-sac. It was necessary to proceed with endovaginal exam following the transabdominal exam to visualize the uterus, endometrium, bilateral ovaries and bilateral adnexa. Color and duplex Doppler ultrasound was utilized to evaluate blood flow to the ovaries. COMPARISON:  None Available. FINDINGS: Uterus Measurements: 12.6 cm x 7.6 cm x 7.9 cm = volume: 393 mL. A 2.6 cm x 3.0 cm x 2.4 cm heterogeneous uterine fibroid is seen within the lower uterine segment on the left. Endometrium Thickness: 2.5 mm. A large amount of  complex fluid is seen within the endometrial canal. Right ovary Measurements: 2.8 cm x 2.0 cm x 2.1 cm = volume: 6.3 mL. A 1.9 cm x 1.2 cm x 1.4 cm simple right ovarian cyst is noted. Left ovary Measurements: 2.3 cm x 1.6 cm x 1.7 cm = volume: 3.4 mL. Normal appearance/no adnexal mass. Pulsed Doppler evaluation of both ovaries demonstrates normal low-resistance arterial and venous waveforms. Other findings A small amount of pelvic free fluid is noted IMPRESSION: 1. 2.6 cm x 3.0 cm x 2.4 cm heterogeneous uterine fibroid. 2. Large amount of complex fluid within the endometrial canal. A hemorrhagic component cannot be excluded. 3. Simple right ovarian cyst. Electronically Signed   By: Aram Candela M.D.   On: 05/07/2023 20:47   CT ABDOMEN PELVIS W CONTRAST  Result Date: 05/07/2023 CLINICAL DATA:  Lower abdominal and pelvic pain and cramping for several days. Nausea. Chills. EXAM: CT ABDOMEN AND PELVIS WITH CONTRAST TECHNIQUE: Multidetector CT imaging of the abdomen and pelvis was performed using the standard protocol following bolus administration of intravenous contrast. RADIATION DOSE REDUCTION: This exam was performed according to the departmental dose-optimization program which includes automated exposure control, adjustment of the mA and/or kV according to patient size and/or use of iterative reconstruction  technique. CONTRAST:  OMNIPAQUE IOHEXOL 300 MG/ML  SOLN COMPARISON:  04/24/2016 FINDINGS: Lower Chest: No acute findings. Hepatobiliary: 4.8 cm benign hemangioma is seen in the anterior liver dome. A 11 mm benign hemangioma is seen in the lateral right hepatic lobe. These are both stable since previous exam. Gallbladder is unremarkable. No evidence of biliary ductal dilatation. Pancreas:  No mass or inflammatory changes. Spleen: Within normal limits in size and appearance. Adrenals/Urinary Tract: No suspicious masses identified. No evidence of ureteral calculi or hydronephrosis. Stomach/Bowel: No evidence of obstruction, inflammatory process or abnormal fluid collections. Vascular/Lymphatic: No pathologically enlarged lymph nodes. No acute vascular findings. Reproductive: Fluid-filled distended endometrial cavity is seen in the uterus, consistent with hydrometros. Low-attenuation soft tissue prominence is seen in the cervix, and obstructing cervical mass cannot be excluded. Several small uterine fibroids are also seen, largest measuring 3 cm. Adnexal regions are unremarkable in appearance. Tiny amount of free pelvic fluid noted. Other:  None. Musculoskeletal:  No suspicious bone lesions identified. 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 Obstructing cervical mass cannot be excluded. Recommend GYN consultation, and consider pelvic ultrasound or MRI for further evaluation. Several small uterine fibroids. Stable benign hepatic hemangiomas. Electronically Signed   By: Danae Orleans M.D.   On: 05/07/2023 17:13    Procedures Procedures   Medications Ordered in ED Medications  ondansetron (ZOFRAN) injection 4 mg (4 mg Intravenous Given 05/07/23 1332)  fentaNYL (SUBLIMAZE) injection 25 mcg (25 mcg Intravenous Given 05/07/23 1331)  fentaNYL (SUBLIMAZE) injection 50 mcg (50 mcg Intravenous Given 05/07/23 1426)  iohexol (OMNIPAQUE) 300 MG/ML solution 100 mL  (100 mLs Intravenous Contrast Given 05/07/23 1440)  morphine (PF) 4 MG/ML injection 4 mg (4 mg Intravenous Given 05/07/23 1506)  morphine (PF) 2 MG/ML injection 2 mg (2 mg Intravenous Given 05/07/23 1806)  morphine (PF) 2 MG/ML injection 1 mg (1 mg Intravenous Given 05/07/23 2037)  ketorolac (TORADOL) 30 MG/ML injection 30 mg (30 mg Intravenous Given 05/07/23 2118)  oxyCODONE-acetaminophen (PERCOCET/ROXICET) 5-325 MG per tablet 2 tablet (2 tablets Oral Given 05/07/23 2119)  sodium chloride 0.9 % bolus 1,000 mL (1,000 mLs Intravenous New Bag/Given 05/07/23 2124)    ED Course/ Medical Decision Making/ A&P  Medical Decision Making Amount and/or Complexity of Data Reviewed Labs: ordered. Radiology: ordered.  Risk Prescription drug management.    This patient presents to the ED for concern of abdominal pain/pelvic pain.  Differential diagnosis includes UTI, pyelonephritis, sigmoid diverticulitis, PID   Lab Tests:  I Ordered, and personally interpreted labs.  The pertinent results include: Leukocytosis of 24.2, urinalysis with large hemoglobin and few bacteria noted but no nitrates or leukocytes, lactic acid 0.9, CMP unremarkable, blood cultures collected and pending, GC chlamydia collected and pending, HIV collected and pending   Imaging Studies ordered:  I ordered imaging studies including CT abdomen pelvis, US pelvic complete I independently visualized and interpreted imaging which showed uterine hydrometra, cannot rule out obstructing mass in the cervix, ultrasound shows a uterine fibroid as well as complex fluid collection in the endometrial canal, cannot exclude hemorrhagic component I agree with the radiologist interpretation   Medicines ordered and prescription drug management:  I ordered medication including Zofran, fentanyl, morphine, Toradol, Percocet, fluids for nausea, pain, dehydration Reevaluation of the patient after these medicines  showed that the patient improved I have reviewed the patients home medicines and have made adjustments as needed   Problem List / ED Course:  Patient presents to the emergency department with reports of pelvic pain.  Reports has been warm for the last 2 days or so and worsening.  Endorses chills but denies any fevers.  States that she was nauseous throughout the entire day yesterday but denies any vomiting.  Has not had a few episodes of vomiting since this morning.  Denies any vaginal bleeding, vaginal discharge, or other pelvic discomfort.  Endorses some pain in the rectum denies any diarrhea or constipation.  Recent history of uterine fibroid surgery on 07/2022. Lab workup concerning for leukocytosis at 24.2, notably elevated compared to prior CBC from 2 weeks ago which was 6.2. Given finding, added on lactic acid but otherwise not meeting SIRS criteria as patient is afebrile, normotensive, not tachycardiac or tachypneic. CMP reassuring and UA without evidence of infection, but some blood and bacteria but no nitrites or leukocytes. CT abdomen pelvis concerning for uterine hydrometra. Unable to rule out any obstructing cervical mass. US imaging and GYN consultation advised. Spoke with Dr. Estanislado Pandy, OBGYN, who is patient's personal gynecologist. Advised attempting pain control with toradol as this would likely have better success. Did not feel that admission is warranted even with notable leukocytosis at 24.2. Dr. Estanislado Pandy advised that patient may be seen in office tomorrow morning to have fluid drained which is ultimately what will alleviate patient's discomfort. [place] with uterine fibroid noted as well as complex fluid and in the endometrial canal. Informed patient of US findings and recommendations from Dr. Estanislado Pandy. Will trial pain control with combination of toradol and Percocet. Will also provide patient with fluids and repeat CBC to assess any changes in CBC with WBC and hemoglobin.  10:04 PM Care of  Jennifer Manning transferred to [facility] and Dr. Donnald Garre at the end of my shift as the patient will require reassessment once labs/imaging have resulted. Patient presentation, ED course, and plan of care discussed with review of all pertinent labs and imaging. Please see his/her note for further details regarding further ED course and disposition. Plan at time of handoff is to reassess for pain control after toradol, Percocet, and fluids. Can discharge home if no severe change in CBC is noted and patient is comfortable with pain control at home. Reiterate following up with Dr. Estanislado Pandy tomorrow morning  in office. This may be altered or completely changed at the discretion of the oncoming team pending results of further workup.  Final Clinical Impression(s) / ED Diagnoses Final diagnoses:  Fluid in endometrial cavity  Uterine leiomyoma, unspecified location    Rx / DC Orders ED Discharge Orders     None         Smitty Knudsen, PA-C 05/07/23 2219    Virgina Norfolk, DO 05/10/23 1555

## 2023-05-07 NOTE — ED Triage Notes (Signed)
Pt states pelvic pain that started Sunday night, worsening over past 2 days  States chills and nausea last night  Denies vaginal bleeding or discharge States pain in rectum  Had uterine fibroid sx in 07/2022 \

## 2023-05-07 NOTE — ED Provider Notes (Signed)
Patient given in sign out by Maryanna Shape, PA-C.  Please review their note for patient HPI, physical exam, workup.  At this time the plan is follow-up on CBC and lactic acid and anticipate discharge with OB/GYN as they have an appointment tomorrow morning if these labs are reassuring.  Repeat CBC does show decreasing white count but does note that hemoglobin did drop 1.4 points from previous.  The plan going forward notes to check orthostatics and make sure patient does not become orthostasis with her blood pressure.  If patient has controlled pain and has not become orthostatic may discharge with OB/GYN follow-up.  Patient's blood pressure 1 point was 83/57 lying down however when standing up blood pressure was 101/63 and patient was asymptomatic throughout all of this.  Patient does feel comfortable being discharged.  Patient is thus not orthostatic.  Will repeat CBC and if stable will discharge with OB/GYN follow-up.  Patient's hypotension may be related to pain meds however need to rule out anemia because with CBC.  Patient's repeat CBC for the third time was stable.  When I went spoke to the patient she states that she wants to go home and that her pain is controlled at this time and she wants to follow-up outpatient.  At this time this appears to be a reasonable request and will discharge.  I strongly encourage patient to follow-up with her OB/GYN in the morning as this would be the only way for definitive treatment.  At patient's request will prescribe few days worth of Norco for pain at home.  I suspect patient's earlier hypotension is related to pain meds that she did receive 7 rounds of strong pain meds while in the ER and as we have ruled out anemia being the cause of patient's hypotension and patient is not orthostatic or symptomatic at this time.  At time of discharge patient was stable to be discharged.  Patient verbalized her understanding acceptance of this plan.   Netta Corrigan,  PA-C 05/07/23 2342    Arby Barrette, MD 05/08/23 [phone]

## 2023-05-07 NOTE — ED Provider Notes (Signed)
I provided a substantive portion of the care of this patient.  I personally made/approved the management plan for this patient and take responsibility for the patient management.    Patient pelvic pain that started 2 nights ago.  She reports it was gradual in onset with cramping quality but worsened significantly over the past 24 hours.  She has not had any vaginal bleeding or discharge.  Patient does have known fibroid.  Patient is alert.  Nontoxic.  Abdomen soft without guarding but has significant reproducible suprapubic lower abdominal pain.  Have personally reviewed CT scan and reviewed radiology interpretation as well.  Patient has distended uterus with fluid volume.  Follow-up ultrasound obtained.  The patient had several low blood pressure readings 80s systolic.  However no elevation in heart rate and patient did not symptomatically feel lightheaded.  She was given fluid resuscitation and CBC monitoring.  Hemoglobin globin stabilized at 11 g/dL.  Patient's blood pressure elevated with standing and orthostatic testing.  I suspect documented hypotension is more secondary to pain medication administration than hypovolemia.  Patient has follow-up with GYN in the morning to address hydro metria.  I agree with plan of management.     Arby Barrette, MD 05/07/23 2340

## 2023-05-07 NOTE — ED Notes (Signed)
Patient noted to be hypotensive while laying and sitting. However, when standing, blood pressure returned to a more normal range. Patient denied any dizziness or nausea upon standing.

## 2023-05-08 LAB — GC/CHLAMYDIA PROBE AMP (~~LOC~~) NOT AT ARMC
Chlamydia: NEGATIVE
Comment: NEGATIVE
Comment: NORMAL
Neisseria Gonorrhea: NEGATIVE

## 2023-05-08 LAB — HIV ANTIBODY (ROUTINE TESTING W REFLEX): HIV Screen 4th Generation wRfx: NONREACTIVE

## 2023-05-09 ENCOUNTER — Other Ambulatory Visit: Payer: Self-pay | Admitting: Family Medicine

## 2023-05-09 MED ORDER — VITAMIN D (ERGOCALCIFEROL) 1.25 MG (50000 UNIT) PO CAPS: 50000.0000 [IU] | ORAL_CAPSULE | ORAL | 1 refills | Status: DC

## 2023-05-11 ENCOUNTER — Telehealth (HOSPITAL_BASED_OUTPATIENT_CLINIC_OR_DEPARTMENT_OTHER): Payer: Self-pay | Admitting: Emergency Medicine

## 2023-05-11 NOTE — Telephone Encounter (Signed)
I received a phone call for a positive blood culture. Gram-positive cocci apparently saw here 4 days ago for leukocytosis and fevers. Discharged to follow-up with gynecology the next day. I called twice no answer left a HIPAA compliant voicemail recommending they call the emergency department back.  Patient may require call back if this is true the gram-positive bacteremia given that leukocytosis she will likely require admission for further blood cultures antibiotics. However history may be incompatible with this and this may just be a contaminant on a blood culture.  It will be based on history per patient.

## 2023-05-12 ENCOUNTER — Telehealth (HOSPITAL_BASED_OUTPATIENT_CLINIC_OR_DEPARTMENT_OTHER): Payer: Self-pay

## 2023-05-12 LAB — BLOOD CULTURE ID PANEL (REFLEXED) - BCID2

## 2023-05-12 LAB — CULTURE, BLOOD (ROUTINE X 2)
Culture: NO GROWTH
Special Requests: ADEQUATE

## 2023-05-12 NOTE — Telephone Encounter (Signed)
Dr Renaye Rakers requested call to pt to recheck how she is feeling due to positive blood culture. EDP instructed if not feeling better would need to return for recheck. Spoke with pt states not feeling better and has not been able to take her antibiotics. Pt instructed to return states understanding

## 2023-05-13 ENCOUNTER — Emergency Department (HOSPITAL_BASED_OUTPATIENT_CLINIC_OR_DEPARTMENT_OTHER)
Admission: EM | Admit: 2023-05-13 | Discharge: 2023-05-13 | Disposition: A | Discharge status: Home / Self Care | Payer: BC Managed Care – PPO | Attending: Emergency Medicine | Admitting: Emergency Medicine

## 2023-05-13 ENCOUNTER — Encounter (HOSPITAL_BASED_OUTPATIENT_CLINIC_OR_DEPARTMENT_OTHER): Payer: Self-pay | Admitting: Emergency Medicine

## 2023-05-13 ENCOUNTER — Other Ambulatory Visit: Payer: Self-pay

## 2023-05-13 DIAGNOSIS — R110 Nausea: Secondary | ICD-10-CM | POA: Diagnosis not present

## 2023-05-13 DIAGNOSIS — R7881 Bacteremia: Secondary | ICD-10-CM | POA: Insufficient documentation

## 2023-05-13 DIAGNOSIS — R799 Abnormal finding of blood chemistry, unspecified: Secondary | ICD-10-CM

## 2023-05-13 LAB — CBC WITH DIFFERENTIAL/PLATELET
Abs Immature Granulocytes: 0.02 10*3/uL (ref 0.00–0.07)
Basophils Absolute: 0 10*3/uL (ref 0.0–0.1)
Basophils Relative: 1 %
Eosinophils Absolute: 0.1 10*3/uL (ref 0.0–0.5)
Eosinophils Relative: 2 %
HCT: 37.6 % (ref 36.0–46.0)
Hemoglobin: 12.5 g/dL (ref 12.0–15.0)
Immature Granulocytes: 0 %
Lymphocytes Relative: 34 %
Lymphs Abs: 1.9 10*3/uL (ref 0.7–4.0)
MCH: 30.9 pg (ref 26.0–34.0)
MCHC: 33.2 g/dL (ref 30.0–36.0)
MCV: 93.1 fL (ref 80.0–100.0)
Monocytes Absolute: 0.4 10*3/uL (ref 0.1–1.0)
Monocytes Relative: 7 %
Neutro Abs: 3.2 10*3/uL (ref 1.7–7.7)
Neutrophils Relative %: 56 %
Platelets: 306 10*3/uL (ref 150–400)
RBC: 4.04 MIL/uL (ref 3.87–5.11)
RDW: 12.7 % (ref 11.5–15.5)
WBC: 5.6 10*3/uL (ref 4.0–10.5)
nRBC: 0 % (ref 0.0–0.2)

## 2023-05-13 LAB — COMPREHENSIVE METABOLIC PANEL
ALT: 22 U/L (ref 0–44)
AST: 27 U/L (ref 15–41)
Albumin: 3.7 g/dL (ref 3.5–5.0)
Alkaline Phosphatase: 48 U/L (ref 38–126)
Anion gap: 10 (ref 5–15)
BUN: 9 mg/dL (ref 6–20)
CO2: 23 mmol/L (ref 22–32)
Calcium: 8.5 mg/dL — ABNORMAL LOW (ref 8.9–10.3)
Chloride: 103 mmol/L (ref 98–111)
Creatinine, Ser: 0.84 mg/dL (ref 0.44–1.00)
GFR, Estimated: >60 mL/min (ref >60)
Glucose, Bld: 87 mg/dL (ref 70–99)
Potassium: 4.1 mmol/L (ref 3.5–5.1)
Sodium: 136 mmol/L (ref 135–145)
Total Bilirubin: 1 mg/dL (ref 0.3–1.2)
Total Protein: 7.1 g/dL (ref 6.5–8.1)

## 2023-05-13 LAB — LACTIC ACID, PLASMA
Lactic Acid, Venous: 0.8 mmol/L (ref 0.5–1.9)
Lactic Acid, Venous: 0.8 mmol/L (ref 0.5–1.9)

## 2023-05-13 LAB — URINALYSIS, ROUTINE W REFLEX MICROSCOPIC
Bilirubin Urine: NEGATIVE
Glucose, UA: NEGATIVE mg/dL
Ketones, ur: NEGATIVE mg/dL
Leukocytes,Ua: NEGATIVE
Nitrite: NEGATIVE
Protein, ur: NEGATIVE mg/dL
Specific Gravity, Urine: 1.015 (ref 1.005–1.030)
pH: 7 (ref 5.0–8.0)

## 2023-05-13 LAB — CULTURE, BLOOD (ROUTINE X 2): Special Requests: ADEQUATE

## 2023-05-13 LAB — URINALYSIS, MICROSCOPIC (REFLEX)

## 2023-05-13 MED ORDER — ACETAMINOPHEN 500 MG PO TABS
1000.0000 mg | ORAL_TABLET | Freq: Once | ORAL | Status: AC
  Administered 2023-05-13: 1000 mg via ORAL
  Filled 2023-05-13: qty 2

## 2023-05-13 MED ORDER — SODIUM CHLORIDE 0.9 % IV SOLN
1.0000 g | Freq: Once | INTRAVENOUS | Status: AC
  Administered 2023-05-13: 1 g via INTRAVENOUS
  Filled 2023-05-13: qty 10

## 2023-05-13 MED ORDER — ONDANSETRON HCL 4 MG PO TABS: 4.0000 mg | ORAL_TABLET | Freq: Three times a day (TID) | ORAL | 0 refills | Status: AC | PRN

## 2023-05-13 MED ORDER — METRONIDAZOLE 500 MG/100ML IV SOLN
500.0000 mg | Freq: Once | INTRAVENOUS | Status: AC
Start: 2023-05-13 — End: 2023-05-13
  Administered 2023-05-13: 500 mg via INTRAVENOUS
  Filled 2023-05-13: qty 100

## 2023-05-13 MED ORDER — ONDANSETRON HCL 4 MG/2ML IJ SOLN
4.0000 mg | Freq: Once | INTRAMUSCULAR | Status: AC
  Administered 2023-05-13: 4 mg via INTRAVENOUS
  Filled 2023-05-13: qty 2

## 2023-05-13 NOTE — ED Provider Notes (Signed)
[place] [facility] Provider Note  CSN: [id number] Arrival date & time: 05/13/23 1008  Chief Complaint(s) Follow-up  HPI Jennifer Manning is a 52 y.o. female presenting to the emergency department with abnormal lab.  Patient was called back because her blood cultures are positive.  She was in the emergency department around 6 days ago, was having some lower abdominal pain, nausea, chills, vomiting, cramping.  Ultimately found to have some fluid in her uterus.  Noted to have leukocytosis and blood cultures were obtained.  She did follow-up with her OB/GYN who was able to drain the fluid with improvement in the pain but she has continued to feel malaise, weakness, nausea and vomiting, chills.  She was prescribed antibiotics by her OB/GYN after the procedure but has not been taking them because she took 1 dose and it made her vomit.  Denies similar symptoms in the past.  No cough, sore throat, runny nose.  No chest pain.  Report of lightheadedness, no syncope.  She is planning on going back to her OB/GYN today to get a dose of ceftriaxone and Flagyl after her procedure.   Past Medical History Past Medical History:  Diagnosis Date   Family history of early CAD 09/11/2022   Migraines    Palpitations 09/11/2022   Post concussive syndrome    Patient Active Problem List   Diagnosis Date Noted   Fall at home 03/21/2023   Lumbar back pain with radiculopathy affecting left lower extremity 03/21/2023   Influenza vaccination declined 03/21/2023   Herpes zoster vaccination declined 03/21/2023   Palpitations 09/11/2022   Family history of early CAD 09/11/2022   Iron deficiency anemia due to chronic blood loss 06/30/2021   Shortness of breath 06/28/2020   Cough 06/28/2020   Chronic migraine without aura without status migrainosus, not intractable 10/28/2018   Vestibular disequilibrium 10/28/2018   Cervicalgia 04/20/2018   Dizziness 09/17/2016   Headache  09/17/2016   Post concussion syndrome 05/17/2016   Vitamin D deficiency 12/20/2013   Home Medication(s) Prior to Admission medications   Medication Sig Start Date End Date Taking? Authorizing Provider  ondansetron (ZOFRAN) 4 MG tablet Take 1 tablet (4 mg total) by mouth every 8 (eight) hours as needed for nausea or vomiting. 05/13/23  Yes Lonell Grandchild, MD  albuterol (VENTOLIN HFA) 108 (90 Base) MCG/ACT inhaler TAKE 2 PUFFS BY MOUTH EVERY 6 HOURS AS NEEDED FOR WHEEZE OR SHORTNESS OF BREATH 04/25/23   Dorothyann Peng, MD  Ascorbic Acid (VITAMIN C) 100 MG tablet Take 100 mg by mouth daily.    [provider]  aspirin-acetaminophen-caffeine (EXCEDRIN MIGRAINE) [phone] MG per tablet Take 1-2 tablets by mouth every 6 (six) hours as needed for headache.    [provider]  cetirizine (ZYRTEC) 10 MG tablet Take 10 mg by mouth as needed.     [provider]  Cholecalciferol (VITAMIN D PO) Take 1 tablet by mouth daily. 2000units    [provider]  ibuprofen (ADVIL) 800 MG tablet TAKE 1 TABLET (800 MG TOTAL) BY MOUTH DAILY AS NEEDED. 04/30/23   Dorothyann Peng, MD  magnesium oxide (MAG-OX) 400 (241.3 Mg) MG tablet Take 400 mg by mouth daily.    [provider]  mometasone (NASONEX) 50 MCG/ACT nasal spray Place 2 sprays into the nose daily. Patient not taking: Reported on 04/18/2023 05/15/22 05/15/23  Arnette Felts, FNP  Omega-3 1000 MG CAPS Take 1,000 mg by mouth daily.    [provider]  rosuvastatin (CRESTOR) 10 MG tablet Take 1 tablet (10 mg total) by mouth daily. Patient not taking: Reported on 04/18/2023 10/29/22 10/24/23  Alver Sorrow, NP  UBRELVY 50 MG TABS TAKE 1 TABLET BY MOUTH DAILY AS NEEDED. 11/30/20   Dorothyann Peng, MD  Vitamin D, Ergocalciferol, (DRISDOL) 1.25 MG (50000 UNIT) CAPS capsule Take 1 capsule (50,000 Units total) by mouth every 7 (seven) days. 05/09/23   Ellender Hose, NP                                                                                                                                     Past Surgical History Past Surgical History:  Procedure Laterality Date   TONSILLECTOMY     TUBAL LIGATION     Family History Family History  Problem Relation Age of Onset   Heart attack Mother    Heart Problems Mother    Hyperlipidemia Mother    Hypertension Mother    Hyperlipidemia Father    Hyperlipidemia Brother    Hypertension Brother    Stroke Maternal Aunt    Stroke Maternal Uncle    Valvular heart disease Paternal Uncle    Heart attack Maternal Grandfather    Dementia Neg Hx     Social History Social History   Tobacco Use   Smoking status: Never    Passive exposure: Never   Smokeless tobacco: Never  Vaping Use   Vaping status: Never Used  Substance Use Topics   Alcohol use: No   Drug use: No   Allergies Other and Sulfa antibiotics  Review of Systems Review of Systems  All other systems reviewed and are negative.   Physical Exam Vital Signs  I have reviewed the triage vital signs BP 139/76   Pulse (!) 55   Temp 98.1 F (36.7 C) (Oral)   Resp 16   Ht 5' (1.524 m)   Wt 63 kg   SpO2 97%   BMI 27.13 kg/m  Physical Exam Vitals and nursing note reviewed.  Constitutional:      General: She is not in acute distress.    Appearance: She is well-developed.  HENT:     Head: Normocephalic and atraumatic.     Mouth/Throat:     Mouth: Mucous membranes are moist.  Eyes:     Pupils: Pupils are equal, round, and reactive to light.  Cardiovascular:     Rate and Rhythm: Normal rate and regular rhythm.     Heart sounds: No murmur heard. Pulmonary:     Effort: Pulmonary effort is normal. No respiratory distress.     Breath sounds: Normal breath sounds.  Abdominal:     General: Abdomen is flat.     Palpations: Abdomen is soft.     Tenderness: There is no abdominal tenderness.  Musculoskeletal:        General: No tenderness.     Right lower leg: No edema.  Left lower  leg: No edema.  Skin:    General: Skin is warm and dry.  Neurological:     General: No focal deficit present.     Mental Status: She is alert. Mental status is at baseline.  Psychiatric:        Mood and Affect: Mood normal.        Behavior: Behavior normal.     ED Results and Treatments Labs (all labs ordered are listed, but only abnormal results are displayed) Labs Reviewed  COMPREHENSIVE METABOLIC PANEL - Abnormal; Notable for the following components:      Result Value   Calcium 8.5 (*)    All other components within normal limits  CULTURE, BLOOD (ROUTINE X 2)  CULTURE, BLOOD (ROUTINE X 2)  CBC WITH DIFFERENTIAL/PLATELET  LACTIC ACID, PLASMA  URINALYSIS, ROUTINE W REFLEX MICROSCOPIC  LACTIC ACID, PLASMA                                                                                                                          Radiology No results found.  Pertinent labs & imaging results that were available during my care of the patient were reviewed by me and considered in my medical decision making (see MDM for details).  Medications Ordered in ED Medications  cefTRIAXone (ROCEPHIN) 1 g in sodium chloride 0.9 % 100 mL IVPB (has no administration in time range)  metroNIDAZOLE (FLAGYL) IVPB 500 mg (has no administration in time range)                                                                                                                                     Procedures Procedures  (including critical care time)  Medical Decision Making / ED Course   MDM:  52 year old presenting to the emergency department with positive blood culture result.  Patient overall very well-appearing, vital signs reassuring.  No fever, no hypotension, no tachycardia.  Reviewed previous ER visit, patient had significant leukocytosis to 24, apparently was also intermittently hypotensive into the 80s systolic.  Patient was ultimately discharged did follow-up with OB/GYN and reports that  fluid is drainage and her pain is improved but she continues to feel some generalized weakness and malaise type symptoms.  Blood culture grew positive for micrococcus, which would be unusual for an immunocompetent person.  Gonorrhea and Chlamydia testing, HIV testing were negative on previous visit.  Physical  exam currently without any acute findings.  Given that usual blood culture result, likely discussed with ID.  Will obtain repeat blood cultures, lab test.  Clinical Course as of 05/13/23 1230  Mon May 13, 2023  1218 Labs reassuring.  No ongoing white count.  Lactic acid normal.  Seems most likely to be contaminant.  Discussed with Dr. Thedore Mins with infectious disease who agrees, recommends repeat culture draw.  Blood cultures were obtained.  Discussed with the patient.  She reports that her OB/GYN wanted her to get a dose of antibiotics today for her recent procedure, I think this is reasonable but I advised the patient that she should follow-up with her OB/GYN doctor to make sure they do not want to prescribe for anything else post procedurally.  She will call them today.  Also advised for her to follow-up with them as scheduled. Will discharge patient to home. All questions answered. Patient comfortable with plan of discharge. Return precautions discussed with patient and specified on the after visit summary.  [WS]    Clinical Course User Index [WS] Lonell Grandchild, MD     Additional history obtained: -Additional history obtained from spouse -External records from outside source obtained and reviewed including: Chart review including previous notes, labs, imaging, consultation notes including past visit   Lab Tests: -I ordered, reviewed, and interpreted labs.   The pertinent results include:   Labs Reviewed  COMPREHENSIVE METABOLIC PANEL - Abnormal; Notable for the following components:      Result Value   Calcium 8.5 (*)    All other components within normal limits  CULTURE, BLOOD  (ROUTINE X 2)  CULTURE, BLOOD (ROUTINE X 2)  CBC WITH DIFFERENTIAL/PLATELET  LACTIC ACID, PLASMA  URINALYSIS, ROUTINE W REFLEX MICROSCOPIC  LACTIC ACID, PLASMA    Notable for normal lactate, no leukocytosis    Medicines ordered and prescription drug management: Meds ordered this encounter  Medications   cefTRIAXone (ROCEPHIN) 1 g in sodium chloride 0.9 % 100 mL IVPB    Order Specific Question:   Antibiotic Indication:    Answer:   Other Indication (list below)   metroNIDAZOLE (FLAGYL) IVPB 500 mg    Order Specific Question:   Antibiotic Indication:    Answer:   Intra-abdominal Infection   ondansetron (ZOFRAN) 4 MG tablet    Sig: Take 1 tablet (4 mg total) by mouth every 8 (eight) hours as needed for nausea or vomiting.    Dispense:  12 tablet    Refill:  0    -I have reviewed the patients home medicines and have made adjustments as needed   Consultations Obtained: I requested consultation with the infectious disease,  and discussed lab and imaging findings as well as pertinent plan - they recommend: repeat cultures, discharge   Cardiac Monitoring: The patient was maintained on a cardiac monitor.  I personally viewed and interpreted the cardiac monitored which showed an underlying rhythm of: NSR    Reevaluation: After the interventions noted above, I reevaluated the patient and found that their symptoms have improved  Co morbidities that complicate the patient evaluation  Past Medical History:  Diagnosis Date   Family history of early CAD 09/11/2022   Migraines    Palpitations 09/11/2022   Post concussive syndrome       Dispostion: Disposition decision including need for hospitalization was considered, and patient discharged from emergency department.    Final Clinical Impression(s) / ED Diagnoses Final diagnoses:  Contamination of blood culture  Nausea  This chart was dictated using voice recognition software.  Despite best efforts to proofread,   errors can occur which can change the documentation meaning.    Lonell Grandchild, MD 05/13/23 1230

## 2023-05-13 NOTE — ED Triage Notes (Addendum)
Seen here on 10/15, had labs drawn and was told she had to a positive cultures has gyn  appointment today for antibiotics she states  is still nauseated, states she stopped taking the antibiotics doxy that was rx at d/c because it was making her sick so she hasn't had any meds since the 10/16

## 2023-05-13 NOTE — Discharge Instructions (Addendum)
We evaluated you for your positive blood culture test.  We repeated your blood tests in the emergency department and your tests were reassuring.  We discussed your results with the infectious disease doctors and they do not think you need to be admitted to the hospital.  We believe that your positive test was a false positive test.  This test can sometimes be contaminated by skin bacteria.  We did obtain repeat blood cultures and can monitor these to make sure they do not become positive.  We gave you a dose of antibiotics in the emergency department that was recommended by your [facility] physician.  Please be sure to follow-up with them as soon as possible.  Please call them today to see if they want you to have any other prescriptions.  Since you are still having some nausea, I have prescribed you a nausea medication called Zofran.  You can take this every 8 hours as needed for nausea or vomiting.  We will call you if your blood cultures return positive.  Please also return if you develop any new symptoms such as fevers, increasing weakness or fatigue, cough, abdominal pain, chest pain, shortness of breath, or any other new symptoms.

## 2023-05-14 ENCOUNTER — Telehealth (HOSPITAL_BASED_OUTPATIENT_CLINIC_OR_DEPARTMENT_OTHER): Payer: Self-pay | Admitting: *Deleted

## 2023-05-14 NOTE — Telephone Encounter (Signed)
Post ED Visit - Positive Culture Follow-up  Culture report reviewed by antimicrobial stewardship pharmacist: [facility] [x]  [place], [place].D. []  Celedonio Miyamoto, Pharm.D., BCPS AQ-ID []  Garvin Fila, Pharm.D., BCPS []  Georgina Pillion, [address].D., BCPS []  [place], [address].D., BCPS, AAHIVP []  Estella Husk, Pharm.D., BCPS, AAHIVP []  Lysle Pearl, PharmD, BCPS []  Phillips Climes, PharmD, BCPS []  Agapito Games, PharmD, BCPS []  Verlan Friends, PharmD []  Mervyn Gay, PharmD, BCPS []  Vinnie Level, PharmD  [facility] []  Len Childs, PharmD []  Greer Pickerel, PharmD []  Adalberto Cole, PharmD []  Perlie Gold, Rph []  Lonell Face) Jean Rosenthal, PharmD []  Earl Many, PharmD []  Junita Push, PharmD []  Dorna Leitz, PharmD []  Terrilee Files, PharmD []  Lynann Beaver, PharmD []  Keturah Barre, PharmD []  Loralee Pacas, PharmD []  Bernadene Person, PharmD   Positive blood culture Contaminated and no further patient follow-up is required at this time.  Virl Axe [facility] 05/14/2023, 8:00 AM

## 2023-05-18 LAB — CULTURE, BLOOD (ROUTINE X 2)
Culture: NO GROWTH
Culture: NO GROWTH
Special Requests: ADEQUATE
Special Requests: ADEQUATE

## 2023-06-14 ENCOUNTER — Other Ambulatory Visit: Payer: Self-pay | Admitting: Family Medicine

## 2023-08-31 LAB — GLUCOSE, POCT (MANUAL RESULT ENTRY): Glucose Fasting, POC: 109 mg/dL — AB (ref 70–99)

## 2023-09-23 ENCOUNTER — Encounter: Payer: Self-pay | Admitting: Internal Medicine

## 2023-09-23 ENCOUNTER — Ambulatory Visit (INDEPENDENT_AMBULATORY_CARE_PROVIDER_SITE_OTHER): Payer: BC Managed Care – PPO | Admitting: Internal Medicine

## 2023-09-23 VITALS — BP 110/70 | HR 63 | Temp 97.7°F | Ht 60.0 in | Wt 133.2 lb

## 2023-09-23 DIAGNOSIS — Z0000 Encounter for general adult medical examination without abnormal findings: Secondary | ICD-10-CM | POA: Diagnosis not present

## 2023-09-23 DIAGNOSIS — M79609 Pain in unspecified limb: Secondary | ICD-10-CM | POA: Insufficient documentation

## 2023-09-23 DIAGNOSIS — M79642 Pain in left hand: Secondary | ICD-10-CM | POA: Insufficient documentation

## 2023-09-23 DIAGNOSIS — R202 Paresthesia of skin: Secondary | ICD-10-CM

## 2023-09-23 DIAGNOSIS — E559 Vitamin D deficiency, unspecified: Secondary | ICD-10-CM | POA: Diagnosis not present

## 2023-09-23 DIAGNOSIS — G8929 Other chronic pain: Secondary | ICD-10-CM

## 2023-09-23 DIAGNOSIS — M545 Low back pain, unspecified: Secondary | ICD-10-CM | POA: Diagnosis not present

## 2023-09-23 MED ORDER — TIZANIDINE HCL 4 MG PO TABS: 4.0000 mg | ORAL_TABLET | Freq: Every day | ORAL | 0 refills | Status: DC

## 2023-09-23 NOTE — Patient Instructions (Addendum)
 [facility], Dr. Lorelle Gibbs Magnesium glycinate Pure Encapsulation Adriana Simas with bone broth  Health Maintenance, Female Adopting a healthy lifestyle and getting preventive care are important in promoting health and wellness. Ask your health care provider about: The right schedule for you to have regular tests and exams. Things you can do on your own to prevent diseases and keep yourself healthy. What should I know about diet, weight, and exercise? Eat a healthy diet  Eat a diet that includes plenty of vegetables, fruits, low-fat dairy products, and lean protein. Do not eat a lot of foods that are high in solid fats, added sugars, or sodium. Maintain a healthy weight Body mass index (BMI) is used to identify weight problems. It estimates body fat based on height and weight. Your health care provider can help determine your BMI and help you achieve or maintain a healthy weight. Get regular exercise Get regular exercise. This is one of the most important things you can do for your health. Most adults should: Exercise for at least 150 minutes each week. The exercise should increase your heart rate and make you sweat (moderate-intensity exercise). Do strengthening exercises at least twice a week. This is in addition to the moderate-intensity exercise. Spend less time sitting. Even light physical activity can be beneficial. Watch cholesterol and blood lipids Have your blood tested for lipids and cholesterol at 55 years of age, then have this test every 5 years. Have your cholesterol levels checked more often if: Your lipid or cholesterol levels are high. You are older than 55 years of age. You are at high risk for heart disease. What should I know about cancer screening? Depending on your health history and family history, you may need to have cancer screening at various ages. This may include screening for: Breast cancer. Cervical cancer. Colorectal cancer. Skin cancer. Lung  cancer. What should I know about heart disease, diabetes, and high blood pressure? Blood pressure and heart disease High blood pressure causes heart disease and increases the risk of stroke. This is more likely to develop in people who have high blood pressure readings or are overweight. Have your blood pressure checked: Every 3-5 years if you are 27-8 years of age. Every year if you are 24 years old or older. Diabetes Have regular diabetes screenings. This checks your fasting blood sugar level. Have the screening done: Once every three years after age 29 if you are at a normal weight and have a low risk for diabetes. More often and at a younger age if you are overweight or have a high risk for diabetes. What should I know about preventing infection? Hepatitis B If you have a higher risk for hepatitis B, you should be screened for this virus. Talk with your health care provider to find out if you are at risk for hepatitis B infection. Hepatitis C Testing is recommended for: Everyone born from [birth year] through 1967. Anyone with known risk factors for hepatitis C. Sexually transmitted infections (STIs) Get screened for STIs, including gonorrhea and chlamydia, if: You are sexually active and are younger than 55 years of age. You are older than 55 years of age and your health care provider tells you that you are at risk for this type of infection. Your sexual activity has changed since you were last screened, and you are at increased risk for chlamydia or gonorrhea. Ask your health care provider if you are at risk. Ask your health care provider about whether you are at high risk for  HIV. Your health care provider may recommend a prescription medicine to help prevent HIV infection. If you choose to take medicine to prevent HIV, you should first get tested for HIV. You should then be tested every 3 months for as long as you are taking the medicine. Pregnancy If you are about to stop having your  period (premenopausal) and you may become pregnant, seek counseling before you get pregnant. Take 400 to 800 micrograms (mcg) of folic acid every day if you become pregnant. Ask for birth control (contraception) if you want to prevent pregnancy. Osteoporosis and menopause Osteoporosis is a disease in which the bones lose minerals and strength with aging. This can result in bone fractures. If you are 95 years old or older, or if you are at risk for osteoporosis and fractures, ask your health care provider if you should: Be screened for bone loss. Take a calcium or vitamin D supplement to lower your risk of fractures. Be given hormone replacement therapy (HRT) to treat symptoms of menopause. Follow these instructions at home: Alcohol use Do not drink alcohol if: Your health care provider tells you not to drink. You are pregnant, may be pregnant, or are planning to become pregnant. If you drink alcohol: Limit how much you have to: 0-1 drink a day. Know how much alcohol is in your drink. In the [place] one drink equals one 12 oz bottle of beer (355 mL), one 5 oz glass of wine (148 mL), or one 1 oz glass of hard liquor (44 mL). Lifestyle Do not use any products that contain nicotine or tobacco. These products include cigarettes, chewing tobacco, and vaping devices, such as e-cigarettes. If you need help quitting, ask your health care provider. Do not use street drugs. Do not share needles. Ask your health care provider for help if you need support or information about quitting drugs. General instructions Schedule regular health, dental, and eye exams. Stay current with your vaccines. Tell your health care provider if: You often feel depressed. You have ever been abused or do not feel safe at home. Summary Adopting a healthy lifestyle and getting preventive care are important in promoting health and wellness. Follow your health care provider's instructions about healthy diet, exercising, and  getting tested or screened for diseases. Follow your health care provider's instructions on monitoring your cholesterol and blood pressure. This information is not intended to replace advice given to you by your health care provider. Make sure you discuss any questions you have with your health care provider. Document Revised: 11/28/2020 Document Reviewed: 11/28/2020 Elsevier Patient Education  2024 [employer].

## 2023-09-23 NOTE — Assessment & Plan Note (Signed)
 Chronic, she agrees to chiropractic evaluation.  If she gets no relief, will consider physical therapy. She also agrees to resume magnesium supplementation.

## 2023-09-23 NOTE — Assessment & Plan Note (Signed)
 She had NCS in April 2024, neg for CTS. No other neuropathy was identified. She is advised to wear a wrist sleeve to see if this offers any relief.
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
## 2023-09-23 NOTE — Assessment & Plan Note (Signed)

## 2023-09-23 NOTE — Assessment & Plan Note (Addendum)
 Squeeze test is positive. I will check an arthritis panel today.  She is encouraged to follow an anti-inflammatory diet.  She is aware that it is not unusual to have more arthralgias during the peri/postmenopausal stage. She is encouraged to incorporate bone broth into her diet.

## 2023-09-23 NOTE — Assessment & Plan Note (Signed)
 I WILL CHECK A VIT D LEVEL AND SUPPLEMENT AS NEEDED.  ALSO ENCOURAGED TO SPEND 15 MINUTES IN THE SUN DAILY.

## 2023-09-27 ENCOUNTER — Other Ambulatory Visit: Payer: Self-pay | Admitting: Internal Medicine

## 2023-09-28 LAB — ANTINUCLEAR ANTIBODIES, IFA: ANA Titer 1: POSITIVE — AB

## 2023-09-28 LAB — SEDIMENTATION RATE: Sed Rate: 27 mm/h (ref 0–40)

## 2023-09-28 LAB — RHEUMATOID FACTOR: Rheumatoid fact SerPl-aCnc: <10 [IU]/mL (ref <14.0)

## 2023-09-28 LAB — URIC ACID: Uric Acid: 4.7 mg/dL (ref 3.0–7.2)

## 2023-09-28 LAB — FANA STAINING PATTERNS: Speckled Pattern: 1:80 {titer}

## 2023-09-28 LAB — VITAMIN D 25 HYDROXY (VIT D DEFICIENCY, FRACTURES): Vit D, 25-Hydroxy: 29.1 ng/mL — ABNORMAL LOW (ref 30.0–100.0)

## 2023-09-28 LAB — CYCLIC CITRUL PEPTIDE ANTIBODY, IGG/IGA: Cyclic Citrullin Peptide Ab: 8 U (ref 0–19)

## 2023-10-19 ENCOUNTER — Other Ambulatory Visit: Payer: Self-pay | Admitting: Internal Medicine

## 2023-10-25 NOTE — Progress Notes (Signed)
 The patient attended screening event on 08/30/2023 where Pt BP was 127/77, Blood Glucose 109 Fasting. At the event the pt noted private insurance for coverage, pt document no for tobacco, pt noted did not live with someone that smokes. Pt documented no for SDOH insecurities at the event. Pt listed PCP as Dorothyann Peng, MD at the event.   Chart review confirms pt was last seen by PCP on 09/23/2023 at [facility]. Chart review confirms pt insurance is [employer]. Chart review indicates no SDOH insecurities. At the PCP office visit on 09/23/2023 pt was seen for examination/back pain, Pt BP was 110/70 on 09/23/2023. Chart review also indicates a future appt for pt on 10/12/2024. No additional Health equity team support indicated at this time.

## 2023-10-30 ENCOUNTER — Ambulatory Visit: Payer: Self-pay | Admitting: Internal Medicine

## 2023-10-30 NOTE — Telephone Encounter (Signed)
 Disposition: [x]  Follow-up with PCP Additional Notes: Pt called in about her lab results. Pt requests a call back with a list of rheumatologist recommendations from Dr. Allyne Gee and a recommendation for the best magnesium and amount for constipation and help with energy.   Copied from CRM [id number]. Topic: Clinical - Lab/Test Results >> Oct 30, 2023  4:18 PM Jennifer Manning wrote: Reason for CRM: Patient has questions about lab results Reason for Disposition  [1] Caller requesting NON-URGENT health information AND [2] PCP's office is the best resource  Answer Assessment - Initial Assessment Questions 1. REASON FOR CALL or QUESTION: "What is your reason for calling today?" or "How can I best help you?" or "What question do you have that I can help answer?"     Results question  Protocols used: Information Only Call - No Triage-A-AH

## 2023-10-31 ENCOUNTER — Other Ambulatory Visit: Payer: Self-pay | Admitting: Obstetrics and Gynecology

## 2023-10-31 DIAGNOSIS — Z1231 Encounter for screening mammogram for malignant neoplasm of breast: Secondary | ICD-10-CM

## 2023-11-22 ENCOUNTER — Other Ambulatory Visit: Payer: Self-pay | Admitting: Internal Medicine

## 2024-03-23 ENCOUNTER — Other Ambulatory Visit: Payer: Self-pay | Admitting: Internal Medicine

## 2024-03-23 DIAGNOSIS — R0602 Shortness of breath: Secondary | ICD-10-CM

## 2024-03-23 DIAGNOSIS — J4520 Mild intermittent asthma, uncomplicated: Secondary | ICD-10-CM

## 2024-03-23 DIAGNOSIS — R059 Cough, unspecified: Secondary | ICD-10-CM

## 2024-03-26 NOTE — Progress Notes (Signed)
 Office Visit Note  Patient: Jennifer Manning             Date of Birth: 02/03/1971           MRN: [id number]             PCP: Jarold Medici, MD Referring: Darcel Pool, MD Visit Date: 04/07/2024 Occupation: @GUAROCC @  Subjective:  Joint discomfort, positive ANA  History of Present Illness: Jennifer Manning is a 53 y.o. female seen for the evaluation of arthralgias and positive ANA.  According the patient her symptoms started about 2 years ago with fatigue.  She always had heavy menstrual cycles and cramps.  She has been under care of Dr. Darcel.  She had fibroid surgery in January 2024 and evacuation of uterus in November 2024.  She states for the ongoing fatigue she had labs which showed positive ANA test for reason she was referred to me.  She has also been experiencing some numbness in her hands for the last 1-1/2 years and also decreased grip strength.  She reports some discomfort in her neck, lower back, hands, knees and her feet.  She states in May 2025 she injured her right foot and developed tendinitis.  She has been under care of Dr. Alycia, podiatrist since then.  She states she has had a boot which she has been wearing since May 2025 intermittently.  She has not noticed much improvement so far.  She denies any swelling in her joints. There is no history of oral ulcers, nasal ulcers, sicca symptoms, malar rash, photosensitivity, Raynaud's, lymphadenopathy. There is no family history of autoimmune disease. 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  She enjoys cooking and reading.  She also exercises on a regular basis which includes walking, weights and yoga.  She is married, gravida 4, para 3, miscarriage 1.  There is no history of preeclampsia or DVTs.  She denies alcohol intake and has never been a smoker.    Activities of Daily Living:  Patient reports morning stiffness for a few minutes.   Patient Denies nocturnal pain.  Difficulty dressing/grooming:  Denies Difficulty climbing stairs: Denies Difficulty getting out of chair: Denies Difficulty using hands for taps, buttons, cutlery, and/or writing: Denies  Review of Systems  Constitutional:  Positive for fatigue.  HENT:  Negative for mouth sores and mouth dryness.   Eyes:  Positive for dryness.  Respiratory:  Negative for shortness of breath.   Cardiovascular:  Positive for palpitations. Negative for chest pain.  Gastrointestinal:  Negative for blood in stool, constipation and diarrhea.  Endocrine: Negative for increased urination.  Genitourinary:  Negative for involuntary urination.  Musculoskeletal:  Positive for joint pain, gait problem, joint pain, muscle weakness and morning stiffness. Negative for joint swelling, myalgias, muscle tenderness and myalgias.  Skin:  Negative for color change, rash, hair loss and sensitivity to sunlight.  Allergic/Immunologic: Negative for susceptible to infections.  Neurological:  Negative for dizziness and headaches.  Hematological:  Negative for swollen glands.  Psychiatric/Behavioral:  Positive for sleep disturbance. Negative for depressed mood. The patient is not nervous/anxious.     PMFS History:  Patient Active Problem List   Diagnosis Date Noted   Encounter for general adult medical examination w/o abnormal findings 09/23/2023   Chronic bilateral low back pain without sciatica 09/23/2023   Pain of left hand 09/23/2023   Paresthesia and pain of left extremity 09/23/2023   Fall at home 03/21/2023   Lumbar back pain with radiculopathy affecting  left lower extremity 03/21/2023   Influenza vaccination declined 03/21/2023   Herpes zoster vaccination declined 03/21/2023   Palpitations 09/11/2022   Family history of early CAD 09/11/2022   Iron  deficiency anemia due to chronic blood loss 06/30/2021   Shortness of breath 06/28/2020   Cough 06/28/2020   Chronic migraine without aura without status migrainosus, not intractable 10/28/2018    Vestibular disequilibrium 10/28/2018   Cervicalgia 04/20/2018   Dizziness 09/17/2016   Headache 09/17/2016   Post concussion syndrome 05/17/2016   Vitamin D  deficiency 12/20/2013    Past Medical History:  Diagnosis Date   Family history of early CAD 09/11/2022   Migraines    Palpitations 09/11/2022   Post concussive syndrome     Family History  Problem Relation Age of Onset   Heart attack Mother    Heart Problems Mother    Hyperlipidemia Mother    Hypertension Mother    Hyperlipidemia Father    Hyperlipidemia Brother    Hypertension Brother    Stroke Maternal Aunt    Stroke Maternal Uncle    Valvular heart disease Paternal Uncle    Heart attack Maternal Grandfather    Healthy Daughter    Healthy Daughter    Healthy Son    Dementia Neg Hx    Past Surgical History:  Procedure Laterality Date   ABLATION  07/2022   OBGYN procedure   04/2023   TONSILLECTOMY     TUBAL LIGATION     Social History   Social History Narrative   Lives with her husband and 3 children.     Right handed   Immunization History  Administered Date(s) Administered   Influenza,inj,Quad PF,6+ Mos 04/26/2021   PFIZER(Purple Top)SARS-COV-2 Vaccination 09/26/2019, 10/17/2019, 08/05/2020   Tdap 09/20/2022     Objective: Vital Signs: BP 108/75   Pulse 66   Temp 98.2 F (36.8 C)   Resp 15   Ht 4' 11.5 (1.511 m)   Wt 135 lb 6.4 oz (61.4 kg)   BMI 26.89 kg/m    Physical Exam Vitals and nursing note reviewed.  Constitutional:      Appearance: She is well-developed.  HENT:     Head: Normocephalic and atraumatic.  Eyes:     Conjunctiva/sclera: Conjunctivae normal.  Cardiovascular:     Rate and Rhythm: Normal rate and regular rhythm.     Heart sounds: Normal heart sounds.  Pulmonary:     Effort: Pulmonary effort is normal.     Breath sounds: Normal breath sounds.  Abdominal:     General: Bowel sounds are normal.     Palpations: Abdomen is soft.  Musculoskeletal:     Cervical back:  Normal range of motion.  Lymphadenopathy:     Cervical: No cervical adenopathy.  Skin:    General: Skin is warm and dry.     Capillary Refill: Capillary refill takes less than 2 seconds.  Neurological:     Mental Status: She is alert and oriented to person, place, and time.  Psychiatric:        Behavior: Behavior normal.      Musculoskeletal Exam: Cervical, thoracic and lumbar spine were in good range of motion.  There was no SI joint tenderness.  Shoulder joints, elbow joints, wrist joints, MCPs, PIPs and DIPs were in good range of motion with no synovitis.  Hip joints and knee joints were in good range of motion without any warmth swelling or effusion.  She had mild tenderness over the medial aspect of her right  ankle.  No synovitis was noted.  There was no tenderness over MTPs or PIPs.   CDAI Exam: CDAI Score: -- Patient Global: --; Provider Global: -- Swollen: --; Tender: -- Joint Exam 04/07/2024   No joint exam has been documented for this visit   There is currently no information documented on the homunculus. Go to the Rheumatology activity and complete the homunculus joint exam.  Investigation: No additional findings.  Imaging: No results found.  Recent Labs: Lab Results  Component Value Date   WBC 5.6 05/13/2023   HGB 12.5 05/13/2023   PLT 306 05/13/2023   NA 136 05/13/2023   K 4.1 05/13/2023   CL 103 05/13/2023   CO2 23 05/13/2023   GLUCOSE 87 05/13/2023   BUN 9 05/13/2023   CREATININE 0.84 05/13/2023   BILITOT 1.0 05/13/2023   ALKPHOS 48 05/13/2023   AST 27 05/13/2023   ALT 22 05/13/2023   PROT 7.1 05/13/2023   ALBUMIN 3.7 05/13/2023   CALCIUM  8.5 (L) 05/13/2023   GFRAA 100 08/30/2020   September 23, 2023 ANA 1: 80 NS, RF negative, anti-CCP negative, uric acid 4.7, vitamin D29.1  Speciality Comments: No specialty comments available.  Procedures:  No procedures performed Allergies: Other and Sulfa antibiotics   Assessment / Plan:     Visit Diagnoses:  Positive ANA (antinuclear antibody) -patient gives history of fatigue for the last 2 years.  She states she had labs done which showed positive ANA.  She is concerned about autoimmune disease as she thinks somebody in her family may have autoimmune disease.  She denies any history of oral ulcers, nasal ulcers, malar rash, photosensitivity, Raynaud's, lymphadenopathy or inflammatory arthritis.  She gives history of fatigue and arthralgias.  Plan: Protein / creatinine ratio, urine, Sedimentation rate, ANA, Anti-scleroderma antibody, RNP Antibody, Anti-Smith antibody, Sjogrens syndrome-B extractable nuclear antibody, C3 and C4, Anti-DNA antibody, double-stranded, Sjogrens syndrome-A extractable nuclear antibody, Beta-2 glycoprotein antibodies, Cardiolipin antibodies, IgG, IgM, IgA  Other fatigue -ongoing fatigue for the last 2 years per patient.  Plan: CBC with Differential/Platelet, Comprehensive metabolic panel with GFR, TSH, Serum protein electrophoresis with reflex, CK  Pain in both hands-she has some stiffness and discomfort in her hands.  No synovitis was noted.  No synovial thickening was noted.  Paresthesia of both hands-she reports paresthesias in her hands especially at night and also sometimes when she wakes up in the morning.  Her symptoms appear to be suggestive of carpal tunnel syndrome.  Tinel's and Phalen's test was negative and manual compression test was negative.  I advised her to try carpal tunnel braces at nighttime.  Paresthesia of both feet -she complains of some paresthesias in her feet.  I will obtain following labs today.  Plan: Hemoglobin A1c, B12 and Folate Panel  Chronic right ankle pain-patient states she sprained her right ankle in May 2025.  She has been under care of a podiatrist.  She had x-rays and was given a boot.  She continues to have some discomfort over the medial aspect of her right ankle.  Cervicalgia-she has intermittent discomfort in her neck most likely due to  computer use.  She had good range of motion without any radiculopathy.  Chronic midline low back pain without sciatica-she gives history of intermittent lower back pain.  She had no tenderness over the lumbar spine and had good mobility of the lumbar spine.  Palpitations  Vitamin D  deficiency -she had vitamin D  50,000 units for 3 months and now taking vitamin D  2000 units  daily.  Plan: VITAMIN D  25 Hydroxy (Vit-D Deficiency, Fractures)  Iron  deficiency anemia due to chronic blood loss-patient gives history of heavy periods and menstrual cramps.  She has fibroids and had surgery in the past.  Chronic migraine without aura without status migrainosus, not intractable  Post concussion syndrome-she continues to have some residual headaches.  Orders: Orders Placed This Encounter  Procedures   CBC with Differential/Platelet   Comprehensive metabolic panel with GFR   Protein / creatinine ratio, urine   Sedimentation rate   TSH   ANA   Anti-scleroderma antibody   RNP Antibody   Anti-Smith antibody   Sjogrens syndrome-B extractable nuclear antibody   C3 and C4   Anti-DNA antibody, double-stranded   Sjogrens syndrome-A extractable nuclear antibody   Beta-2 glycoprotein antibodies   Cardiolipin antibodies, IgG, IgM, IgA   VITAMIN D  25 Hydroxy (Vit-D Deficiency, Fractures)   Serum protein electrophoresis with reflex   CK   Hemoglobin A1c   B12 and Folate Panel   No orders of the defined types were placed in this encounter.    Follow-Up Instructions: Return for +ANA, arthralgia.   Maya Nash, MD  Note - This record has been created using Animal nutritionist.  Chart creation errors have been sought, but may not always  have been located. Such creation errors do not reflect on  the standard of medical care.

## 2024-04-07 ENCOUNTER — Ambulatory Visit: Payer: Self-pay | Attending: Rheumatology | Admitting: Rheumatology

## 2024-04-07 ENCOUNTER — Encounter: Payer: Self-pay | Admitting: Rheumatology

## 2024-04-07 VITALS — BP 108/75 | HR 66 | Temp 98.2°F | Resp 15 | Ht 59.5 in | Wt 135.4 lb

## 2024-04-07 DIAGNOSIS — R5383 Other fatigue: Secondary | ICD-10-CM

## 2024-04-07 DIAGNOSIS — M5416 Radiculopathy, lumbar region: Secondary | ICD-10-CM

## 2024-04-07 DIAGNOSIS — G8929 Other chronic pain: Secondary | ICD-10-CM

## 2024-04-07 DIAGNOSIS — R202 Paresthesia of skin: Secondary | ICD-10-CM

## 2024-04-07 DIAGNOSIS — F0781 Postconcussional syndrome: Secondary | ICD-10-CM

## 2024-04-07 DIAGNOSIS — R768 Other specified abnormal immunological findings in serum: Secondary | ICD-10-CM

## 2024-04-07 DIAGNOSIS — M79641 Pain in right hand: Secondary | ICD-10-CM

## 2024-04-07 DIAGNOSIS — M79609 Pain in unspecified limb: Secondary | ICD-10-CM

## 2024-04-07 DIAGNOSIS — M545 Low back pain, unspecified: Secondary | ICD-10-CM

## 2024-04-07 DIAGNOSIS — M25571 Pain in right ankle and joints of right foot: Secondary | ICD-10-CM

## 2024-04-07 DIAGNOSIS — E559 Vitamin D deficiency, unspecified: Secondary | ICD-10-CM

## 2024-04-07 DIAGNOSIS — R002 Palpitations: Secondary | ICD-10-CM

## 2024-04-07 DIAGNOSIS — G43709 Chronic migraine without aura, not intractable, without status migrainosus: Secondary | ICD-10-CM

## 2024-04-07 DIAGNOSIS — M79642 Pain in left hand: Secondary | ICD-10-CM

## 2024-04-07 DIAGNOSIS — D500 Iron deficiency anemia secondary to blood loss (chronic): Secondary | ICD-10-CM

## 2024-04-07 DIAGNOSIS — M542 Cervicalgia: Secondary | ICD-10-CM

## 2024-04-13 ENCOUNTER — Ambulatory Visit: Payer: Self-pay | Admitting: Rheumatology

## 2024-04-13 NOTE — Progress Notes (Signed)
 CBC normal, CMP normal, urine protein creatinine ratio normal, sed rate is mildly elevated.  ANA is low titer and not significant, ENA panel negative, beta-2  GP 1 and anticardiolipin antibodies negative, complements normal, TSH normal, vitamin D  normal, CK normal, hemoglobin A1c normal, B12 and folate normal, IFE pending.  Please forward results to her PCP.  I will discuss results at the follow-up visit.

## 2024-04-14 LAB — PROTEIN ELECTROPHORESIS, SERUM, WITH REFLEX
Albumin ELP: 4.4 g/dL (ref 3.8–4.8)
Alpha 1: 0.2 g/dL (ref 0.2–0.3)
Alpha 2: 0.6 g/dL (ref 0.5–0.9)
Beta 2: 0.6 g/dL — ABNORMAL HIGH (ref 0.2–0.5)
Beta Globulin: 0.5 g/dL (ref 0.4–0.6)
Gamma Globulin: 1.4 g/dL (ref 0.8–1.7)
Total Protein: 7.7 g/dL (ref 6.1–8.1)

## 2024-04-14 LAB — PROTEIN / CREATININE RATIO, URINE
Creatinine, Urine: 23 mg/dL (ref 20–275)
Total Protein, Urine: <4 mg/dL — ABNORMAL LOW (ref 5–24)

## 2024-04-14 LAB — COMPREHENSIVE METABOLIC PANEL WITH GFR
AG Ratio: 1.5 (calc) (ref 1.0–2.5)
ALT: 12 U/L (ref 6–29)
AST: 16 U/L (ref 10–35)
Albumin: 4.5 g/dL (ref 3.6–5.1)
Alkaline phosphatase (APISO): 67 U/L (ref 37–153)
BUN: 10 mg/dL (ref 7–25)
CO2: 25 mmol/L (ref 20–32)
Calcium: 9 mg/dL (ref 8.6–10.4)
Chloride: 105 mmol/L (ref 98–110)
Creat: 0.69 mg/dL (ref 0.50–1.03)
Globulin: 3.1 g/dL (ref 1.9–3.7)
Glucose, Bld: 75 mg/dL (ref 65–99)
Potassium: 4.1 mmol/L (ref 3.5–5.3)
Sodium: 139 mmol/L (ref 135–146)
Total Bilirubin: 0.6 mg/dL (ref 0.2–1.2)
Total Protein: 7.6 g/dL (ref 6.1–8.1)
eGFR: 102 mL/min/1.73m2 (ref >=60)

## 2024-04-14 LAB — B12 AND FOLATE PANEL
Folate: >24 ng/mL
Vitamin B-12: 660 pg/mL (ref 200–1100)

## 2024-04-14 LAB — HEMOGLOBIN A1C
Hgb A1c MFr Bld: 5.5 % (ref <5.7)
Mean Plasma Glucose: 111 mg/dL
eAG (mmol/L): 6.2 mmol/L

## 2024-04-14 LAB — RNP ANTIBODY: Ribonucleic Protein(ENA) Antibody, IgG: <1 AI

## 2024-04-14 LAB — CBC WITH DIFFERENTIAL/PLATELET
Absolute Lymphocytes: 1904 {cells}/uL (ref 850–3900)
Absolute Monocytes: 386 {cells}/uL (ref 200–950)
Basophils Absolute: 50 {cells}/uL (ref 0–200)
Basophils Relative: 0.9 %
Eosinophils Absolute: 67 {cells}/uL (ref 15–500)
Eosinophils Relative: 1.2 %
HCT: 37.3 % (ref 35.0–45.0)
Hemoglobin: 11.7 g/dL (ref 11.7–15.5)
MCH: 28.3 pg (ref 27.0–33.0)
MCHC: 31.4 g/dL — ABNORMAL LOW (ref 32.0–36.0)
MCV: 90.3 fL (ref 80.0–100.0)
MPV: 11 fL (ref 7.5–12.5)
Monocytes Relative: 6.9 %
Neutro Abs: 3192 {cells}/uL (ref 1500–7800)
Neutrophils Relative %: 57 %
Platelets: 392 Thousand/uL (ref 140–400)
RBC: 4.13 Million/uL (ref 3.80–5.10)
RDW: 13.6 % (ref 11.0–15.0)
Total Lymphocyte: 34 %
WBC: 5.6 Thousand/uL (ref 3.8–10.8)

## 2024-04-14 LAB — SEDIMENTATION RATE: Sed Rate: 36 mm/h — ABNORMAL HIGH (ref 0–30)

## 2024-04-14 LAB — ANTI-NUCLEAR AB-TITER (ANA TITER): ANA Titer 1: 1:40 {titer} — ABNORMAL HIGH

## 2024-04-14 LAB — BETA-2 GLYCOPROTEIN ANTIBODIES
Beta-2 Glyco 1 IgA: <2 U/mL (ref <20.0)
Beta-2 Glyco 1 IgM: 3.2 U/mL (ref <20.0)
Beta-2 Glyco I IgG: <2 U/mL (ref <20.0)

## 2024-04-14 LAB — ANA: Anti Nuclear Antibody (ANA): POSITIVE — AB

## 2024-04-14 LAB — ANTI-SCLERODERMA ANTIBODY: Scleroderma (Scl-70) (ENA) Antibody, IgG: <1 AI

## 2024-04-14 LAB — CARDIOLIPIN ANTIBODIES, IGG, IGM, IGA
Anticardiolipin IgA: <2 [APL'U]/mL (ref <20.0)
Anticardiolipin IgG: <2 [GPL'U]/mL (ref <20.0)
Anticardiolipin IgM: <2 [MPL'U]/mL (ref <20.0)

## 2024-04-14 LAB — CK: Total CK: 103 U/L (ref 21–240)

## 2024-04-14 LAB — ANTI-DNA ANTIBODY, DOUBLE-STRANDED: ds DNA Ab: 1 [IU]/mL

## 2024-04-14 LAB — SJOGRENS SYNDROME-B EXTRACTABLE NUCLEAR ANTIBODY: SSB (La) (ENA) Antibody, IgG: <1 AI

## 2024-04-14 LAB — TSH: TSH: 1.62 m[IU]/L

## 2024-04-14 LAB — C3 AND C4
C3 Complement: 133 mg/dL (ref 83–193)
C4 Complement: 34 mg/dL (ref 15–57)

## 2024-04-14 LAB — VITAMIN D 25 HYDROXY (VIT D DEFICIENCY, FRACTURES): Vit D, 25-Hydroxy: 42 ng/mL (ref 30–100)

## 2024-04-14 LAB — IFE INTERPRETATION

## 2024-04-14 LAB — SJOGRENS SYNDROME-A EXTRACTABLE NUCLEAR ANTIBODY: SSA (Ro) (ENA) Antibody, IgG: <1 AI

## 2024-04-14 LAB — ANTI-SMITH ANTIBODY: ENA SM Ab Ser-aCnc: <1 AI

## 2024-04-16 NOTE — Progress Notes (Signed)
 Office Visit Note  Patient: Jennifer Manning             Date of Birth: [birth year]           MRN: [id number]             PCP: Jarold Medici, MD Referring: Jarold Medici, MD Visit Date: 04/29/2024 Occupation: Data Unavailable  Subjective:  Positive ANA, fatigue  History of Present Illness: Jennifer Manning is a 53 y.o. female with positive ANA, fatigue and polyarthralgia.  She returns today after initial visit on April 07, 2024.  She continues to have fatigue.  She also gives history of dry eyes.  She continues to have pain in multiple joints without any joint swelling.  She complains of discomfort in her hands, neck and lower back.  She denies any radiculopathy.  There is no history of oral ulcers, nasal ulcers, malar rash, photosensitivity, Raynaud's, lymphadenopathy or inflammatory arthritis.    Activities of Daily Living:  Patient reports morning stiffness for less than 1 minute.   Patient Reports nocturnal pain.  Difficulty dressing/grooming: Denies Difficulty climbing stairs: Denies Difficulty getting out of chair: Denies Difficulty using hands for taps, buttons, cutlery, and/or writing: Denies  Review of Systems  Constitutional:  Positive for fatigue.  HENT:  Negative for mouth sores and mouth dryness.   Eyes:  Positive for dryness. Negative for pain.  Respiratory:  Negative for shortness of breath.   Cardiovascular:  Negative for chest pain and palpitations.  Gastrointestinal:  Negative for blood in stool, constipation and diarrhea.  Endocrine: Negative for increased urination.  Genitourinary:  Negative for involuntary urination.  Musculoskeletal:  Positive for joint pain, joint pain, myalgias and myalgias. Negative for gait problem, joint swelling, muscle weakness, morning stiffness and muscle tenderness.  Skin:  Negative for color change, rash, hair loss and sensitivity to sunlight.  Allergic/Immunologic: Negative for susceptible to infections.  Neurological:   Positive for headaches. Negative for dizziness.  Hematological:  Negative for swollen glands.  Psychiatric/Behavioral:  Negative for depressed mood and sleep disturbance. The patient is not nervous/anxious.     PMFS History:  Patient Active Problem List   Diagnosis Date Noted   Encounter for general adult medical examination w/o abnormal findings 09/23/2023   Chronic bilateral low back pain without sciatica 09/23/2023   Pain of left hand 09/23/2023   Paresthesia and pain of left extremity 09/23/2023   Fall at home 03/21/2023   Lumbar back pain with radiculopathy affecting left lower extremity 03/21/2023   Influenza vaccination declined 03/21/2023   Herpes zoster vaccination declined 03/21/2023   Palpitations 09/11/2022   Family history of early CAD 09/11/2022   Iron  deficiency anemia due to chronic blood loss 06/30/2021   Shortness of breath 06/28/2020   Cough 06/28/2020   Chronic migraine without aura without status migrainosus, not intractable 10/28/2018   Vestibular disequilibrium 10/28/2018   Cervicalgia 04/20/2018   Dizziness 09/17/2016   Headache 09/17/2016   Post concussion syndrome 05/17/2016   Vitamin D  deficiency 12/20/2013    Past Medical History:  Diagnosis Date   Family history of early CAD 09/11/2022   Migraines    Palpitations 09/11/2022   Post concussive syndrome     Family History  Problem Relation Age of Onset   Heart attack Mother    Heart Problems Mother    Hyperlipidemia Mother    Hypertension Mother    Hyperlipidemia Father    Hyperlipidemia Brother    Hypertension Brother    Stroke Maternal  Aunt    Stroke Maternal Uncle    Valvular heart disease Paternal Uncle    Heart attack Maternal Grandfather    Healthy Daughter    Healthy Daughter    Healthy Son    Dementia Neg Hx    Past Surgical History:  Procedure Laterality Date   ABLATION  07/2022   OBGYN procedure   04/2023   TONSILLECTOMY     TUBAL LIGATION     Social History   Tobacco  Use   Smoking status: Never    Passive exposure: Never   Smokeless tobacco: Never  Vaping Use   Vaping status: Never Used  Substance Use Topics   Alcohol use: No   Drug use: No   Social History   Social History Narrative   Lives with her husband and 3 children.     Right handed     Immunization History  Administered Date(s) Administered   Influenza,inj,Quad PF,6+ Mos 04/26/2021   PFIZER(Purple Top)SARS-COV-2 Vaccination 09/26/2019, 10/17/2019, 08/05/2020   Tdap 09/20/2022     Objective: Vital Signs: BP 120/81   Pulse 80   Temp 97.7 F (36.5 C)   Resp 13   Ht 5' (1.524 m)   Wt 133 lb 3.2 oz (60.4 kg)   BMI 26.01 kg/m    Physical Exam Vitals and nursing note reviewed.  Constitutional:      Appearance: She is well-developed.  HENT:     Head: Normocephalic and atraumatic.  Eyes:     Conjunctiva/sclera: Conjunctivae normal.  Cardiovascular:     Rate and Rhythm: Normal rate and regular rhythm.     Heart sounds: Normal heart sounds.  Pulmonary:     Effort: Pulmonary effort is normal.     Breath sounds: Normal breath sounds.  Abdominal:     General: Bowel sounds are normal.     Palpations: Abdomen is soft.  Musculoskeletal:     Cervical back: Normal range of motion.  Lymphadenopathy:     Cervical: No cervical adenopathy.  Skin:    General: Skin is warm and dry.     Capillary Refill: Capillary refill takes less than 2 seconds.  Neurological:     Mental Status: She is alert and oriented to person, place, and time.  Psychiatric:        Behavior: Behavior normal.      Musculoskeletal Exam: Cervical, thoracic and lumbar spine were in good range of motion.  There was no SI joint tenderness.  Shoulder joints, elbow joints, wrist joints, MCPs, PIPs and DIPs were in good range of motion with no synovitis.  Hip joints and knee joints were in good range of motion without any warmth swelling or effusion.  There was no tenderness over ankles or MTPs.   CDAI Exam: CDAI  Score: -- Patient Global: --; Provider Global: -- Swollen: --; Tender: -- Joint Exam 04/29/2024   No joint exam has been documented for this visit   There is currently no information documented on the homunculus. Go to the Rheumatology activity and complete the homunculus joint exam.  Investigation: No additional findings.  Imaging: No results found.  Recent Labs: Lab Results  Component Value Date   WBC 5.6 04/07/2024   HGB 11.7 04/07/2024   PLT 392 04/07/2024   NA 139 04/07/2024   K 4.1 04/07/2024   CL 105 04/07/2024   CO2 25 04/07/2024   GLUCOSE 75 04/07/2024   BUN 10 04/07/2024   CREATININE 0.69 04/07/2024   BILITOT 0.6 04/07/2024  ALKPHOS 48 05/13/2023   AST 16 04/07/2024   ALT 12 04/07/2024   PROT 7.6 04/07/2024   PROT 7.7 04/07/2024   ALBUMIN 3.7 05/13/2023   CALCIUM  9.0 04/07/2024   GFRAA 100 08/30/2020   April 07, 2024 IFE normal, ANA 1: 40 NS, ENA (SCL 70, RNP, Smith, SSA, SSB, dsDNA) negative, C3-C4 normal, anticardiolipin negative, beta-2  GP 1 negative, urine protein creatinine ratio normal, CK103, vitamin D  42, TSH normal, sed rate 36, hemoglobin A1c 5.5, B12 660, folate >24  Speciality Comments: No specialty comments available.  Procedures:  No procedures performed Allergies: Other and Sulfa antibiotics   Assessment / Plan:     Visit Diagnoses: Positive ANA (antinuclear antibody)-ANA 1: 40 NS, ENA panel negative, complements normal, antiphospholipid antibodies negative, urine protein creatinine ratio normal.  Labs are reviewed with the patient.  ANA titer is not significant.  Labs do not indicate active autoimmune disease.  She has no history for ulcers, nasal ulcers, malar rash, full sensitivity, Raynaud's, inflammatory arthritis or lymphadenopathy.  Advised patient to contact me if she develops any new symptoms.  Other fatigue-labs including B12, folate, CK, TSH were within normal limits.  Labs were reviewed with the patient.  Pain in both  hands-she complains of some discomfort in her hands.  No swelling or synovitis was noted.  She is some discomfort mostly over PIP and DIP joints.  Possibility of early degenerative changes were discussed.  Patient declined x-rays.  A handout on hand exercises was given.  Paresthesia of both hands - Use of carpal tunnel braces was advised.  Paresthesia of both feet-hemoglobin A1c was normal.  B12 and folate were normal.  Chronic pain of right ankle - Followed by podiatrist.  History of previous sprain in May 2025.  Cervicalgia-she continues to have some neck stiffness.  A handout on neck exercises was given.  Chronic midline low back pain without sciatica-she has chronic discomfort without any radiculopathy.  A handout on back exercises was given.  Palpitations  Vitamin D  deficiency-vitamin D  level is normal.  Iron  deficiency anemia due to chronic blood loss  Chronic migraine without aura without status migrainosus, not intractable  Post concussion syndrome  Orders: No orders of the defined types were placed in this encounter.  No orders of the defined types were placed in this encounter.    Follow-Up Instructions: Return if symptoms worsen or fail to improve, for +ANA , arthralgia.   Maya Nash, MD  Note - This record has been created using Animal nutritionist.  Chart creation errors have been sought, but may not always  have been located. Such creation errors do not reflect on  the standard of medical care.

## 2024-04-29 ENCOUNTER — Encounter: Payer: Self-pay | Admitting: Rheumatology

## 2024-04-29 ENCOUNTER — Ambulatory Visit: Payer: Self-pay | Attending: Rheumatology | Admitting: Rheumatology

## 2024-04-29 VITALS — BP 120/81 | HR 80 | Temp 97.7°F | Resp 13 | Ht 60.0 in | Wt 133.2 lb

## 2024-04-29 DIAGNOSIS — R202 Paresthesia of skin: Secondary | ICD-10-CM

## 2024-04-29 DIAGNOSIS — M545 Low back pain, unspecified: Secondary | ICD-10-CM

## 2024-04-29 DIAGNOSIS — G8929 Other chronic pain: Secondary | ICD-10-CM

## 2024-04-29 DIAGNOSIS — M79641 Pain in right hand: Secondary | ICD-10-CM

## 2024-04-29 DIAGNOSIS — M79642 Pain in left hand: Secondary | ICD-10-CM

## 2024-04-29 DIAGNOSIS — R7689 Other specified abnormal immunological findings in serum: Secondary | ICD-10-CM | POA: Diagnosis not present

## 2024-04-29 DIAGNOSIS — F0781 Postconcussional syndrome: Secondary | ICD-10-CM

## 2024-04-29 DIAGNOSIS — D500 Iron deficiency anemia secondary to blood loss (chronic): Secondary | ICD-10-CM

## 2024-04-29 DIAGNOSIS — R5383 Other fatigue: Secondary | ICD-10-CM

## 2024-04-29 DIAGNOSIS — M542 Cervicalgia: Secondary | ICD-10-CM

## 2024-04-29 DIAGNOSIS — G43709 Chronic migraine without aura, not intractable, without status migrainosus: Secondary | ICD-10-CM

## 2024-04-29 DIAGNOSIS — M25571 Pain in right ankle and joints of right foot: Secondary | ICD-10-CM

## 2024-04-29 DIAGNOSIS — R768 Other specified abnormal immunological findings in serum: Secondary | ICD-10-CM

## 2024-04-29 DIAGNOSIS — E559 Vitamin D deficiency, unspecified: Secondary | ICD-10-CM

## 2024-04-29 DIAGNOSIS — R002 Palpitations: Secondary | ICD-10-CM

## 2024-04-29 NOTE — Patient Instructions (Signed)
 Low Back Sprain or Strain Rehab Ask your health care provider which exercises are safe for you. Do exercises exactly as told by your health care provider and adjust them as directed. It is normal to feel mild stretching, pulling, tightness, or discomfort as you do these exercises. Stop right away if you feel sudden pain or your pain gets worse. Do not begin these exercises until told by your health care provider. Stretching and range-of-motion exercises These exercises warm up your muscles and joints and improve the movement and flexibility of your back. These exercises also help to relieve pain, numbness, and tingling. Lumbar rotation  Lie on your back on a firm bed or the floor with your knees bent. Straighten your arms out to your sides so each arm forms a 90-degree angle (right angle) with a side of your body. Slowly move (rotate) both of your knees to one side of your body until you feel a stretch in your lower back (lumbar). Try not to let your shoulders lift off the floor. Hold this position for __________ seconds. Tense your abdominal muscles and slowly move your knees back to the starting position. Repeat this exercise on the other side of your body. Repeat __________ times. Complete this exercise __________ times a day. Single knee to chest  Lie on your back on a firm bed or the floor with both legs straight. Bend one of your knees. Use your hands to move your knee up toward your chest until you feel a gentle stretch in your lower back and buttock. Hold your leg in this position by holding on to the front of your knee. Keep your other leg as straight as possible. Hold this position for __________ seconds. Slowly return to the starting position. Repeat with your other leg. Repeat __________ times. Complete this exercise __________ times a day. Prone extension on elbows  Lie on your abdomen on a firm bed or the floor (prone position). Prop yourself up on your elbows. Use your arms  to help lift your chest up until you feel a gentle stretch in your abdomen and your lower back. This will place some of your body weight on your elbows. If this is uncomfortable, try stacking pillows under your chest. Your hips should stay down, against the surface that you are lying on. Keep your hip and back muscles relaxed. Hold this position for __________ seconds. Slowly relax your upper body and return to the starting position. Repeat __________ times. Complete this exercise __________ times a day. Strengthening exercises These exercises build strength and endurance in your back. Endurance is the ability to use your muscles for a long time, even after they get tired. Pelvic tilt This exercise strengthens the muscles that lie deep in the abdomen. Lie on your back on a firm bed or the floor with your legs extended. Bend your knees so they are pointing toward the ceiling and your feet are flat on the floor. Tighten your lower abdominal muscles to press your lower back against the floor. This motion will tilt your pelvis so your tailbone points up toward the ceiling instead of pointing to your feet or the floor. To help with this exercise, you may place a small towel under your lower back and try to push your back into the towel. Hold this position for __________ seconds. Let your muscles relax completely before you repeat this exercise. Repeat __________ times. Complete this exercise __________ times a day. Alternating arm and leg raises  Get on your hands  and knees on a firm surface. If you are on a hard floor, you may want to use padding, such as an exercise mat, to cushion your knees. Line up your arms and legs. Your hands should be directly below your shoulders, and your knees should be directly below your hips. Lift your left leg behind you. At the same time, raise your right arm and straighten it in front of you. Do not lift your leg higher than your hip. Do not lift your arm higher  than your shoulder. Keep your abdominal and back muscles tight. Keep your hips facing the ground. Do not arch your back. Keep your balance carefully, and do not hold your breath. Hold this position for __________ seconds. Slowly return to the starting position. Repeat with your right leg and your left arm. Repeat __________ times. Complete this exercise __________ times a day. Abdominal set with straight leg raise  Lie on your back on a firm bed or the floor. Bend one of your knees and keep your other leg straight. Tense your abdominal muscles and lift your straight leg up, 4-6 inches (10-15 cm) off the ground. Keep your abdominal muscles tight and hold this position for __________ seconds. Do not hold your breath. Do not arch your back. Keep it flat against the ground. Keep your abdominal muscles tense as you slowly lower your leg back to the starting position. Repeat with your other leg. Repeat __________ times. Complete this exercise __________ times a day. Single leg lower with bent knees Lie on your back on a firm bed or the floor. Tense your abdominal muscles and lift your feet off the floor, one foot at a time, so your knees and hips are bent in 90-degree angles (right angles). Your knees should be over your hips and your lower legs should be parallel to the floor. Keeping your abdominal muscles tense and your knee bent, slowly lower one of your legs so your toe touches the ground. Lift your leg back up to return to the starting position. Do not hold your breath. Do not let your back arch. Keep your back flat against the ground. Repeat with your other leg. Repeat __________ times. Complete this exercise __________ times a day. Posture and body mechanics Good posture and healthy body mechanics can help to relieve stress in your body's tissues and joints. Body mechanics refers to the movements and positions of your body while you do your daily activities. Posture is part of body  mechanics. Good posture means: Your spine is in its natural S-curve position (neutral). Your shoulders are pulled back slightly. Your head is not tipped forward (neutral). Follow these guidelines to improve your posture and body mechanics in your everyday activities. Standing  When standing, keep your spine neutral and your feet about hip-width apart. Keep a slight bend in your knees. Your ears, shoulders, and hips should line up. When you do a task in which you stand in one place for a long time, place one foot up on a stable object that is 2-4 inches (5-10 cm) high, such as a footstool. This helps keep your spine neutral. Sitting  When sitting, keep your spine neutral and keep your feet flat on the floor. Use a footrest, if necessary, and keep your thighs parallel to the floor. Avoid rounding your shoulders, and avoid tilting your head forward. When working at a desk or a computer, keep your desk at a height where your hands are slightly lower than your elbows. Slide your  chair under your desk so you are close enough to maintain good posture. When working at a computer, place your monitor at a height where you are looking straight ahead and you do not have to tilt your head forward or downward to look at the screen. Resting When lying down and resting, avoid positions that are most painful for you. If you have pain with activities such as sitting, bending, stooping, or squatting, lie in a position in which your body does not bend very much. For example, avoid curling up on your side with your arms and knees near your chest (fetal position). If you have pain with activities such as standing for a long time or reaching with your arms, lie with your spine in a neutral position and bend your knees slightly. Try the following positions: Lying on your side with a pillow between your knees. Lying on your back with a pillow under your knees. Lifting  When lifting objects, keep your feet at least  shoulder-width apart and tighten your abdominal muscles. Bend your knees and hips and keep your spine neutral. It is important to lift using the strength of your legs, not your back. Do not lock your knees straight out. Always ask for help to lift heavy or awkward objects. This information is not intended to replace advice given to you by your health care provider. Make sure you discuss any questions you have with your health care provider. Document Revised: 11/12/2022 Document Reviewed: 09/26/2020 [employer] Patient Education  2024 [employer]Cervical Strain and Sprain Rehab Ask your health care provider which exercises are safe for you. Do exercises exactly as told by your health care provider and adjust them as directed. It is normal to feel mild stretching, pulling, tightness, or discomfort as you do these exercises. Stop right away if you feel sudden pain or your pain gets worse. Do not begin these exercises until told by your health care provider. Stretching and range-of-motion exercises Cervical side bending  Using good posture, sit on a stable chair or stand up. Without moving your shoulders, slowly tilt your left / right ear to your shoulder until you feel a stretch in the neck muscles on the opposite side. You should be looking straight ahead. Hold for __________ seconds. Repeat with the other side of your neck. Repeat __________ times. Complete this exercise __________ times a day. Cervical rotation  Using good posture, sit on a stable chair or stand up. Slowly turn your head to the side as if you are looking over your left / right shoulder. Keep your eyes level with the ground. Stop when you feel a stretch along the side and the back of your neck. Hold for __________ seconds. Repeat this by turning to your other side. Repeat __________ times. Complete this exercise __________ times a day. Thoracic extension and pectoral stretch  Roll a towel or a small blanket so it is about 4  inches (10 cm) in diameter. Lie down on your back on a firm surface. Put the towel in the middle of your back across your spine. It should not be under your shoulder blades. Put your hands behind your head and let your elbows fall out to your sides. Hold for __________ seconds. Repeat __________ times. Complete this exercise __________ times a day. Strengthening exercises Upper cervical flexion  Lie on your back with a thin pillow behind your head or a small, rolled-up towel under your neck. Gently tuck your chin toward your chest and nod your head  down to look toward your feet. Do not lift your head off the pillow. Hold for __________ seconds. Release the tension slowly. Relax your neck muscles completely before you repeat this exercise. Repeat __________ times. Complete this exercise __________ times a day. Cervical extension  Stand about 6 inches (15 cm) away from a wall, with your back facing the wall. Place a soft object, about 6-8 inches (15-20 cm) in diameter, between the back of your head and the wall. A soft object could be a small pillow, a ball, or a folded towel. Gently tilt your head back and press into the soft object. Keep your jaw and forehead relaxed. Hold for __________ seconds. Release the tension slowly. Relax your neck muscles completely before you repeat this exercise. Repeat __________ times. Complete this exercise __________ times a day. Posture and body mechanics Body mechanics refer to the movements and positions of your body while you do your daily activities. Posture is part of body mechanics. Good posture and healthy body mechanics can help to relieve stress in your body's tissues and joints. Good posture means that your spine is in its natural S-curve position (your spine is neutral), your shoulders are pulled back slightly, and your head is not tipped forward. The following are general guidelines for using improved posture and body mechanics in your everyday  activities. Sitting  When sitting, keep your spine neutral and keep your feet flat on the floor. Use a footrest, if needed, and keep your thighs parallel to the floor. Avoid rounding your shoulders. Avoid tilting your head forward. When working at a desk or a computer, keep your desk at a height where your hands are slightly lower than your elbows. Slide your chair under your desk so you are close enough to maintain good posture. When working at a computer, place your monitor at a height where you are looking straight ahead and you do not have to tilt your head forward or downward to look at the screen. Standing  When standing, keep your spine neutral and keep your feet about hip-width apart. Keep a slight bend in your knees. Your ears, shoulders, and hips should line up. When you do a task in which you stand in one place for a long time, place one foot up on a stable object that is 2-4 inches (5-10 cm) high, such as a footstool. This helps keep your spine neutral. Resting When lying down and resting, avoid positions that are most painful for you. Try to support your neck in a neutral position. You can use a contour pillow or a small rolled-up towel. Your pillow should support your neck but not push on it. This information is not intended to replace advice given to you by your health care provider. Make sure you discuss any questions you have with your health care provider. Document Revised: 11/12/2022 Document Reviewed: 01/29/2022 [employer] Patient Education  2024 [employer]Hand Exercises Hand exercises can be helpful for almost anyone. They can strengthen your hands and improve flexibility and movement. The exercises can also increase blood flow to the hands. These results can make your work and daily tasks easier for you. Hand exercises can be especially helpful for people who have joint pain from arthritis or nerve damage from using their hands over and over. These exercises can also help  people who injure a hand. Exercises Most of these hand exercises are gentle stretching and motion exercises. It is usually safe to do them often throughout the day. Warming up your  hands before exercise may help reduce stiffness. You can do this with gentle massage or by placing your hands in warm water for 10-15 minutes. It is normal to feel some stretching, pulling, tightness, or mild discomfort when you begin new exercises. In time, this will improve. Remember to always be careful and stop right away if you feel sudden, very bad pain or your pain gets worse. You want to get better and be safe. Ask your health care provider which exercises are safe for you. Do exercises exactly as told by your provider and adjust them as told. Do not begin these exercises until told by your provider. Knuckle bend or claw fist  Stand or sit with your arm, hand, and all five fingers pointed straight up. Make sure to keep your wrist straight. Gently bend your fingers down toward your palm until the tips of your fingers are touching your palm. Keep your big knuckle straight and only bend the small knuckles in your fingers. Hold this position for 10 seconds. Straighten your fingers back to your starting position. Repeat this exercise 5-10 times with each hand. Full finger fist  Stand or sit with your arm, hand, and all five fingers pointed straight up. Make sure to keep your wrist straight. Gently bend your fingers into your palm until the tips of your fingers are touching the middle of your palm. Hold this position for 10 seconds. Extend your fingers back to your starting position, stretching every joint fully. Repeat this exercise 5-10 times with each hand. Straight fist  Stand or sit with your arm, hand, and all five fingers pointed straight up. Make sure to keep your wrist straight. Gently bend your fingers at the big knuckle, where your fingers meet your hand, and at the middle knuckle. Keep the knuckle at  the tips of your fingers straight and try to touch the bottom of your palm. Hold this position for 10 seconds. Extend your fingers back to your starting position, stretching every joint fully. Repeat this exercise 5-10 times with each hand. Tabletop  Stand or sit with your arm, hand, and all five fingers pointed straight up. Make sure to keep your wrist straight. Gently bend your fingers at the big knuckle, where your fingers meet your hand, as far down as you can. Keep the small knuckles in your fingers straight. Think of forming a tabletop with your fingers. Hold this position for 10 seconds. Extend your fingers back to your starting position, stretching every joint fully. Repeat this exercise 5-10 times with each hand. Finger spread  Place your hand flat on a table with your palm facing down. Make sure your wrist stays straight. Spread your fingers and thumb apart from each other as far as you can until you feel a gentle stretch. Hold this position for 10 seconds. Bring your fingers and thumb tight together again. Hold this position for 10 seconds. Repeat this exercise 5-10 times with each hand. Making circles  Stand or sit with your arm, hand, and all five fingers pointed straight up. Make sure to keep your wrist straight. Make a circle by touching the tip of your thumb to the tip of your index finger. Hold for 10 seconds. Then open your hand wide. Repeat this motion with your thumb and each of your fingers. Repeat this exercise 5-10 times with each hand. Thumb motion  Sit with your forearm resting on a table and your wrist straight. Your thumb should be facing up toward the ceiling. Keep your  fingers relaxed as you move your thumb. Lift your thumb up as high as you can toward the ceiling. Hold for 10 seconds. Bend your thumb across your palm as far as you can, reaching the tip of your thumb for the small finger (pinkie) side of your palm. Hold for 10 seconds. Repeat this exercise  5-10 times with each hand. Grip strengthening  Hold a stress ball or other soft ball in the middle of your hand. Slowly increase the pressure, squeezing the ball as much as you can without causing pain. Think of bringing the tips of your fingers into the middle of your palm. All of your finger joints should bend when doing this exercise. Hold your squeeze for 10 seconds, then relax. Repeat this exercise 5-10 times with each hand. Contact a health care provider if: Your hand pain or discomfort gets much worse when you do an exercise. Your hand pain or discomfort does not improve within 2 hours after you exercise. If you have either of these problems, stop doing these exercises right away. Do not do them again unless your provider says that you can. Get help right away if: You develop sudden, severe hand pain or swelling. If this happens, stop doing these exercises right away. Do not do them again unless your provider says that you can. This information is not intended to replace advice given to you by your health care provider. Make sure you discuss any questions you have with your health care provider. Document Revised: 07/24/2022 Document Reviewed: 07/24/2022 [employer] Patient Education  2024 [employer].

## 2024-06-04 ENCOUNTER — Encounter: Payer: Self-pay | Admitting: Rheumatology

## 2024-06-10 ENCOUNTER — Other Ambulatory Visit: Payer: Self-pay | Admitting: Obstetrics and Gynecology

## 2024-06-16 ENCOUNTER — Ambulatory Visit: Payer: Self-pay | Admitting: Rheumatology

## 2024-07-07 ENCOUNTER — Ambulatory Visit: Payer: Self-pay | Admitting: Rheumatology

## 2024-07-30 ENCOUNTER — Ambulatory Visit (HOSPITAL_COMMUNITY): Admit: 2024-07-30 | Admitting: Obstetrics and Gynecology

## 2024-07-30 SURGERY — HYSTERECTOMY, TOTAL, LAPAROSCOPIC, ROBOT-ASSISTED WITH SALPINGECTOMY
Anesthesia: General

## 2024-08-07 ENCOUNTER — Other Ambulatory Visit: Payer: Self-pay | Admitting: Internal Medicine

## 2024-10-01 ENCOUNTER — Encounter: Payer: Self-pay | Admitting: Internal Medicine

## 2024-10-12 ENCOUNTER — Encounter: Payer: Self-pay | Admitting: Internal Medicine
